# Patient Record
Sex: Female | Born: 1945 | Race: White | Hispanic: No | Marital: Married | Smoking: Current every day smoker
Health system: Southern US, Community
[De-identification: ages and names within clinical notes are randomized; demographics above are authoritative.]

## PROBLEM LIST (undated history)

## (undated) DIAGNOSIS — R011 Cardiac murmur, unspecified: Secondary | ICD-10-CM

## (undated) DIAGNOSIS — M810 Age-related osteoporosis without current pathological fracture: Secondary | ICD-10-CM

## (undated) DIAGNOSIS — R8271 Bacteriuria: Secondary | ICD-10-CM

## (undated) DIAGNOSIS — N3090 Cystitis, unspecified without hematuria: Secondary | ICD-10-CM

## (undated) DIAGNOSIS — R1310 Dysphagia, unspecified: Secondary | ICD-10-CM

## (undated) DIAGNOSIS — K589 Irritable bowel syndrome without diarrhea: Secondary | ICD-10-CM

## (undated) DIAGNOSIS — M1990 Unspecified osteoarthritis, unspecified site: Secondary | ICD-10-CM

## (undated) DIAGNOSIS — J45909 Unspecified asthma, uncomplicated: Secondary | ICD-10-CM

## (undated) DIAGNOSIS — F32A Depression, unspecified: Secondary | ICD-10-CM

## (undated) DIAGNOSIS — F3163 Bipolar disorder, current episode mixed, severe, without psychotic features: Secondary | ICD-10-CM

## (undated) DIAGNOSIS — IMO0002 Reserved for concepts with insufficient information to code with codable children: Secondary | ICD-10-CM

## (undated) DIAGNOSIS — F419 Anxiety disorder, unspecified: Secondary | ICD-10-CM

## (undated) DIAGNOSIS — F319 Bipolar disorder, unspecified: Secondary | ICD-10-CM

## (undated) DIAGNOSIS — F329 Major depressive disorder, single episode, unspecified: Secondary | ICD-10-CM

## (undated) DIAGNOSIS — D696 Thrombocytopenia, unspecified: Secondary | ICD-10-CM

## (undated) DIAGNOSIS — F0390 Unspecified dementia without behavioral disturbance: Secondary | ICD-10-CM

## (undated) HISTORY — DX: Unspecified asthma, uncomplicated: J45.909

## (undated) HISTORY — DX: Bipolar disorder, current episode mixed, severe, without psychotic features: F31.63

## (undated) HISTORY — DX: Cardiac murmur, unspecified: R01.1

## (undated) HISTORY — PX: CHOLECYSTECTOMY: SHX55

## (undated) HISTORY — DX: Major depressive disorder, single episode, unspecified: F32.9

## (undated) HISTORY — DX: Depression, unspecified: F32.A

## (undated) HISTORY — DX: Reserved for concepts with insufficient information to code with codable children: IMO0002

## (undated) HISTORY — DX: Cystitis, unspecified without hematuria: N30.90

## (undated) HISTORY — DX: Thrombocytopenia, unspecified: D69.6

## (undated) HISTORY — PX: ELBOW SURGERY: SHX618

## (undated) HISTORY — DX: Age-related osteoporosis without current pathological fracture: M81.0

## (undated) HISTORY — PX: SPINE SURGERY: SHX786

## (undated) HISTORY — DX: Irritable bowel syndrome, unspecified: K58.9

## (undated) HISTORY — PX: SHOULDER SURGERY: SHX246

## (undated) HISTORY — PX: BREAST BIOPSY: SHX20

## (undated) HISTORY — DX: Dysphagia, unspecified: R13.10

## (undated) HISTORY — DX: Unspecified osteoarthritis, unspecified site: M19.90

## (undated) HISTORY — DX: Bacteriuria: R82.71

---

## 2000-05-17 HISTORY — PX: COLONOSCOPY: SHX174

## 2003-04-19 ENCOUNTER — Other Ambulatory Visit: Payer: Self-pay

## 2003-04-26 ENCOUNTER — Other Ambulatory Visit: Payer: Self-pay

## 2004-02-07 ENCOUNTER — Other Ambulatory Visit: Payer: Self-pay

## 2004-04-29 ENCOUNTER — Ambulatory Visit: Payer: Self-pay | Admitting: Physician Assistant

## 2004-06-01 ENCOUNTER — Emergency Department: Payer: Self-pay | Admitting: Emergency Medicine

## 2004-06-10 ENCOUNTER — Emergency Department: Payer: Self-pay | Admitting: Emergency Medicine

## 2004-06-12 ENCOUNTER — Ambulatory Visit: Payer: Self-pay | Admitting: Unknown Physician Specialty

## 2004-06-16 ENCOUNTER — Ambulatory Visit: Payer: Self-pay | Admitting: Unknown Physician Specialty

## 2004-11-06 ENCOUNTER — Ambulatory Visit: Payer: Self-pay

## 2004-11-11 ENCOUNTER — Ambulatory Visit: Payer: Self-pay | Admitting: Surgery

## 2004-11-22 ENCOUNTER — Emergency Department: Payer: Self-pay | Admitting: Unknown Physician Specialty

## 2004-11-22 ENCOUNTER — Other Ambulatory Visit: Payer: Self-pay

## 2004-11-26 ENCOUNTER — Emergency Department: Payer: Self-pay | Admitting: Emergency Medicine

## 2004-12-28 ENCOUNTER — Ambulatory Visit: Payer: Self-pay | Admitting: Unknown Physician Specialty

## 2005-01-15 ENCOUNTER — Emergency Department: Payer: Self-pay | Admitting: Emergency Medicine

## 2005-02-10 ENCOUNTER — Emergency Department: Payer: Self-pay | Admitting: Emergency Medicine

## 2005-09-01 ENCOUNTER — Ambulatory Visit: Payer: Self-pay | Admitting: Specialist

## 2005-09-21 ENCOUNTER — Other Ambulatory Visit: Payer: Self-pay

## 2005-09-21 ENCOUNTER — Emergency Department: Payer: Self-pay | Admitting: Emergency Medicine

## 2005-12-01 ENCOUNTER — Ambulatory Visit: Payer: Self-pay | Admitting: Surgery

## 2006-03-18 ENCOUNTER — Inpatient Hospital Stay: Payer: Self-pay | Admitting: Specialist

## 2006-03-18 ENCOUNTER — Other Ambulatory Visit: Payer: Self-pay

## 2006-03-22 ENCOUNTER — Ambulatory Visit: Payer: Self-pay | Admitting: Specialist

## 2006-05-15 ENCOUNTER — Emergency Department: Payer: Self-pay | Admitting: Emergency Medicine

## 2006-06-09 ENCOUNTER — Ambulatory Visit: Payer: Self-pay | Admitting: Specialist

## 2006-07-15 ENCOUNTER — Ambulatory Visit: Payer: Self-pay | Admitting: Specialist

## 2006-08-22 ENCOUNTER — Other Ambulatory Visit: Payer: Self-pay

## 2006-08-23 ENCOUNTER — Inpatient Hospital Stay: Payer: Self-pay | Admitting: Unknown Physician Specialty

## 2006-08-23 ENCOUNTER — Observation Stay: Payer: Self-pay | Admitting: Unknown Physician Specialty

## 2006-12-05 ENCOUNTER — Ambulatory Visit: Payer: Self-pay | Admitting: General Surgery

## 2007-01-20 ENCOUNTER — Ambulatory Visit: Payer: Self-pay | Admitting: Internal Medicine

## 2007-08-14 ENCOUNTER — Other Ambulatory Visit: Payer: Self-pay

## 2007-08-14 ENCOUNTER — Inpatient Hospital Stay: Payer: Self-pay | Admitting: Specialist

## 2007-11-28 ENCOUNTER — Ambulatory Visit: Payer: Self-pay | Admitting: Family Medicine

## 2007-12-19 ENCOUNTER — Ambulatory Visit: Payer: Self-pay | Admitting: General Surgery

## 2008-01-23 ENCOUNTER — Ambulatory Visit: Payer: Self-pay | Admitting: Family Medicine

## 2008-04-21 ENCOUNTER — Ambulatory Visit: Payer: Self-pay | Admitting: Family Medicine

## 2008-05-13 ENCOUNTER — Ambulatory Visit: Payer: Self-pay | Admitting: Unknown Physician Specialty

## 2008-05-23 ENCOUNTER — Ambulatory Visit: Payer: Self-pay | Admitting: Internal Medicine

## 2008-10-09 ENCOUNTER — Ambulatory Visit: Payer: Self-pay | Admitting: Unknown Physician Specialty

## 2008-12-26 ENCOUNTER — Ambulatory Visit: Payer: Self-pay | Admitting: Family Medicine

## 2009-01-16 ENCOUNTER — Ambulatory Visit: Payer: Self-pay | Admitting: General Surgery

## 2009-01-27 ENCOUNTER — Ambulatory Visit: Payer: Self-pay | Admitting: Unknown Physician Specialty

## 2009-03-21 ENCOUNTER — Encounter: Admission: RE | Admit: 2009-03-21 | Discharge: 2009-03-21 | Payer: Self-pay | Admitting: Neurology

## 2009-04-12 ENCOUNTER — Emergency Department: Payer: Self-pay | Admitting: Internal Medicine

## 2009-05-17 HISTORY — PX: OTHER SURGICAL HISTORY: SHX169

## 2009-05-17 HISTORY — PX: UPPER GI ENDOSCOPY: SHX6162

## 2009-06-09 ENCOUNTER — Ambulatory Visit: Payer: Self-pay | Admitting: Unknown Physician Specialty

## 2009-07-07 ENCOUNTER — Emergency Department: Payer: Self-pay | Admitting: Emergency Medicine

## 2009-08-18 ENCOUNTER — Ambulatory Visit: Payer: Self-pay | Admitting: Unknown Physician Specialty

## 2009-10-31 ENCOUNTER — Inpatient Hospital Stay: Payer: Self-pay | Admitting: Orthopedic Surgery

## 2009-11-05 ENCOUNTER — Ambulatory Visit: Payer: Self-pay | Admitting: Cardiovascular Disease

## 2010-03-12 ENCOUNTER — Ambulatory Visit: Payer: Self-pay | Admitting: Orthopedic Surgery

## 2010-03-17 ENCOUNTER — Ambulatory Visit: Payer: Self-pay | Admitting: Orthopedic Surgery

## 2010-05-17 HISTORY — PX: OTHER SURGICAL HISTORY: SHX169

## 2010-09-28 ENCOUNTER — Ambulatory Visit: Payer: Self-pay | Admitting: Otolaryngology

## 2010-09-29 ENCOUNTER — Ambulatory Visit: Payer: Self-pay | Admitting: General Surgery

## 2010-09-30 LAB — PATHOLOGY REPORT

## 2010-10-30 ENCOUNTER — Ambulatory Visit: Payer: Self-pay | Admitting: Orthopedic Surgery

## 2010-11-05 ENCOUNTER — Ambulatory Visit: Payer: Self-pay | Admitting: Anesthesiology

## 2010-11-10 ENCOUNTER — Ambulatory Visit: Payer: Self-pay | Admitting: Orthopedic Surgery

## 2010-11-28 ENCOUNTER — Ambulatory Visit: Payer: Self-pay | Admitting: Unknown Physician Specialty

## 2011-01-25 ENCOUNTER — Ambulatory Visit: Payer: Self-pay | Admitting: Unknown Physician Specialty

## 2011-05-28 ENCOUNTER — Ambulatory Visit: Payer: Self-pay

## 2011-06-17 ENCOUNTER — Ambulatory Visit: Payer: Self-pay | Admitting: Rheumatology

## 2011-07-13 ENCOUNTER — Ambulatory Visit: Payer: Self-pay | Admitting: Rheumatology

## 2011-10-13 ENCOUNTER — Ambulatory Visit: Payer: Self-pay | Admitting: General Surgery

## 2011-10-25 ENCOUNTER — Ambulatory Visit: Payer: Self-pay | Admitting: General Surgery

## 2011-11-21 ENCOUNTER — Ambulatory Visit: Payer: Self-pay

## 2011-12-20 ENCOUNTER — Ambulatory Visit: Payer: Self-pay | Admitting: Internal Medicine

## 2012-02-24 ENCOUNTER — Ambulatory Visit: Payer: Self-pay | Admitting: Internal Medicine

## 2012-02-24 LAB — CBC WITH DIFFERENTIAL/PLATELET
Basophil #: 0.1 10*3/uL (ref 0.0–0.1)
Basophil %: 1.2 %
HCT: 38.7 % (ref 35.0–47.0)
HGB: 12.5 g/dL (ref 12.0–16.0)
Lymphocyte #: 2.7 10*3/uL (ref 1.0–3.6)
MCV: 97 fL (ref 80–100)
Monocyte %: 7.8 %
Neutrophil #: 3.9 10*3/uL (ref 1.4–6.5)
RDW: 14.7 % — ABNORMAL HIGH (ref 11.5–14.5)
WBC: 7.6 10*3/uL (ref 3.6–11.0)

## 2012-02-24 LAB — COMPREHENSIVE METABOLIC PANEL
Alkaline Phosphatase: 89 U/L (ref 50–136)
BUN: 17 mg/dL (ref 7–18)
Bilirubin,Total: 0.3 mg/dL (ref 0.2–1.0)
Creatinine: 1.07 mg/dL (ref 0.60–1.30)
EGFR (Non-African Amer.): 54 — ABNORMAL LOW
Glucose: 94 mg/dL (ref 65–99)
SGOT(AST): 14 U/L — ABNORMAL LOW (ref 15–37)
SGPT (ALT): 15 U/L (ref 12–78)
Total Protein: 6.5 g/dL (ref 6.4–8.2)

## 2012-07-11 ENCOUNTER — Ambulatory Visit: Payer: Self-pay | Admitting: Obstetrics and Gynecology

## 2012-11-06 ENCOUNTER — Ambulatory Visit: Payer: Self-pay | Admitting: General Surgery

## 2012-12-13 ENCOUNTER — Ambulatory Visit: Payer: Self-pay | Admitting: General Surgery

## 2012-12-13 ENCOUNTER — Encounter: Payer: Self-pay | Admitting: General Surgery

## 2012-12-21 ENCOUNTER — Ambulatory Visit: Payer: Self-pay | Admitting: General Surgery

## 2013-01-08 ENCOUNTER — Encounter: Payer: Self-pay | Admitting: *Deleted

## 2013-01-16 ENCOUNTER — Ambulatory Visit: Payer: Self-pay | Admitting: General Surgery

## 2013-01-23 ENCOUNTER — Ambulatory Visit (INDEPENDENT_AMBULATORY_CARE_PROVIDER_SITE_OTHER): Payer: Medicare Other | Admitting: General Surgery

## 2013-01-23 ENCOUNTER — Encounter: Payer: Self-pay | Admitting: General Surgery

## 2013-01-23 VITALS — BP 110/70 | HR 68 | Resp 12 | Ht 62.0 in | Wt 119.0 lb

## 2013-01-23 DIAGNOSIS — Z1239 Encounter for other screening for malignant neoplasm of breast: Secondary | ICD-10-CM

## 2013-01-23 DIAGNOSIS — D0502 Lobular carcinoma in situ of left breast: Secondary | ICD-10-CM

## 2013-01-23 DIAGNOSIS — D0500 Lobular carcinoma in situ of unspecified breast: Secondary | ICD-10-CM | POA: Insufficient documentation

## 2013-01-23 DIAGNOSIS — Z853 Personal history of malignant neoplasm of breast: Secondary | ICD-10-CM

## 2013-01-23 NOTE — Progress Notes (Signed)
Patient ID: Deborah [name], female   DOB: Nov 05, 1945, 67 y.o.   MRN: [id number]  Chief Complaint  Patient presents with  . Follow-up    MAMMOGRAM    HPI Deborah [name] is a 67 y.o. female who presents for a breast evaluation. The most recent mammogram was done on 12/13/12 with a birad category 2. Patient does perform regular self breast checks and gets regular mammograms done.  No new breast complaints at this time.    HPI  Past Medical History  Diagnosis Date  . Asthma   . Ulcer   . Heart murmur   . Cystitis   . Arthritis   . Depression   . Spastic colon   . IBS (irritable bowel syndrome)   . Osteoporosis     Past Surgical History  Procedure Laterality Date  . Cholecystectomy    . Spine surgery    . Colonoscopy  2002  . Upper gi endoscopy  2011  . Arm surgery Left 2011    fracture repair  . Throat biopsy   2012  . Shoulder surgery    . Elbow surgery    . Breast biopsy      LCIS    Family History  Problem Relation Age of Onset  . Cancer Sister     ovarian  . Cancer Maternal Aunt     breast  . Cancer Maternal Aunt     breast    Social History History  Substance Use Topics  . Smoking status: Current Every Day Smoker -- 1.00 packs/day for 20 years    Types: Cigarettes  . Smokeless tobacco: Never Used  . Alcohol Use: No    Allergies  Allergen Reactions  . Prednisone Nausea And Vomiting    Current Outpatient Prescriptions  Medication Sig Dispense Refill  . alendronate (FOSAMAX) 70 MG tablet Take 70 mg by mouth every 7 (seven) days. Take with a full glass of water on an empty stomach.      . diazepam (VALIUM) 5 MG tablet Take 1 tablet by mouth daily.      Marland Kitchen escitalopram (LEXAPRO) 10 MG tablet Take 10 mg by mouth daily.      . Naproxen Sodium (ALEVE) 220 MG CAPS Take 1 capsule by mouth as needed.      . topiramate (TOPAMAX) 100 MG tablet Take 1 tablet by mouth daily.      . traZODone (DESYREL) 100 MG tablet Take 1 tablet by mouth 2 (two) times daily.        No current facility-administered medications for this visit.    Review of Systems Review of Systems  Constitutional: Negative.   Respiratory: Negative.   Cardiovascular: Negative.     Blood pressure 110/70, pulse 68, resp. rate 12, height 5\' 2"  (1.575 m), weight 119 lb (53.978 kg).  Physical Exam Physical Exam  Constitutional: She appears well-developed and well-nourished.  Eyes: Conjunctivae are normal. No scleral icterus.  Neck: No thyromegaly present.  Cardiovascular: Normal rate, regular rhythm and normal heart sounds.   No murmur heard. Pulmonary/Chest: Effort normal and breath sounds normal. Right breast exhibits no inverted nipple, no mass, no nipple discharge, no skin change and no tenderness. Left breast exhibits no inverted nipple, no mass, no nipple discharge, no skin change and no tenderness.  Abdominal: Soft. Normal appearance and bowel sounds are normal. There is no hepatosplenomegaly. There is no tenderness. No hernia.  Lymphadenopathy:    She has no cervical adenopathy.    She has no axillary adenopathy.  Neurological: She is alert.  Skin: Skin is warm and dry.    Data Reviewed  Mammogram reviewed.   Assessment    Stable exam. Patient with a history of LCIS.     Plan    Patient to return 1 year with bilateral screening mammogram at [facility].        SANKAR,SEEPLAPUTHUR G 01/23/2013, 7:20 PM

## 2013-01-23 NOTE — Patient Instructions (Signed)
Patient to return 1 year with bilateral screening mammogram at [facility].

## 2013-08-28 ENCOUNTER — Ambulatory Visit: Payer: Self-pay | Admitting: Emergency Medicine

## 2013-08-28 LAB — COMPREHENSIVE METABOLIC PANEL
Albumin: 4.2 g/dL (ref 3.4–5.0)
Alkaline Phosphatase: 78 U/L
Anion Gap: 9 (ref 7–16)
BUN: 13 mg/dL (ref 7–18)
Bilirubin,Total: 0.5 mg/dL (ref 0.2–1.0)
Calcium, Total: 9.4 mg/dL (ref 8.5–10.1)
Chloride: 102 mmol/L (ref 98–107)
Co2: 27 mmol/L (ref 21–32)
Creatinine: 1.12 mg/dL (ref 0.60–1.30)
EGFR (African American): 59 — ABNORMAL LOW
EGFR (Non-African Amer.): 51 — ABNORMAL LOW
Glucose: 100 mg/dL — ABNORMAL HIGH (ref 65–99)
Osmolality: 276 (ref 275–301)
Potassium: 4.3 mmol/L (ref 3.5–5.1)
SGOT(AST): 11 U/L — ABNORMAL LOW (ref 15–37)
SGPT (ALT): 14 U/L (ref 12–78)
Sodium: 138 mmol/L (ref 136–145)
Total Protein: 7.3 g/dL (ref 6.4–8.2)

## 2013-08-28 LAB — CBC WITH DIFFERENTIAL/PLATELET
Basophil #: 0.1 10*3/uL (ref 0.0–0.1)
Basophil %: 0.8 %
Eosinophil #: 0.1 10*3/uL (ref 0.0–0.7)
Eosinophil %: 1.1 %
HCT: 40.5 % (ref 35.0–47.0)
HGB: 13.3 g/dL (ref 12.0–16.0)
Lymphocyte #: 2.5 10*3/uL (ref 1.0–3.6)
Lymphocyte %: 33.6 %
MCH: 32.1 pg (ref 26.0–34.0)
MCHC: 32.9 g/dL (ref 32.0–36.0)
MCV: 98 fL (ref 80–100)
Monocyte #: 0.4 x10 3/mm (ref 0.2–0.9)
Monocyte %: 5.5 %
Neutrophil #: 4.4 10*3/uL (ref 1.4–6.5)
Neutrophil %: 59 %
Platelet: 157 10*3/uL (ref 150–440)
RBC: 4.16 10*6/uL (ref 3.80–5.20)
RDW: 14.9 % — ABNORMAL HIGH (ref 11.5–14.5)
WBC: 7.5 10*3/uL (ref 3.6–11.0)

## 2013-08-30 ENCOUNTER — Emergency Department: Payer: Self-pay | Admitting: Emergency Medicine

## 2013-08-30 LAB — CBC
HCT: 40 % (ref 35.0–47.0)
HGB: 13.1 g/dL (ref 12.0–16.0)
MCH: 32.3 pg (ref 26.0–34.0)
MCHC: 32.7 g/dL (ref 32.0–36.0)
MCV: 99 fL (ref 80–100)
PLATELETS: 141 10*3/uL — AB (ref 150–440)
RBC: 4.04 10*6/uL (ref 3.80–5.20)
RDW: 15.2 % — ABNORMAL HIGH (ref 11.5–14.5)
WBC: 7.3 10*3/uL (ref 3.6–11.0)

## 2013-08-30 LAB — SALICYLATE LEVEL: SALICYLATES, SERUM: 4.8 mg/dL — AB

## 2013-08-30 LAB — COMPREHENSIVE METABOLIC PANEL
ALBUMIN: 3.7 g/dL (ref 3.4–5.0)
ALT: 13 U/L (ref 12–78)
ANION GAP: 4 — AB (ref 7–16)
Alkaline Phosphatase: 71 U/L
BUN: 15 mg/dL (ref 7–18)
Bilirubin,Total: 0.4 mg/dL (ref 0.2–1.0)
CALCIUM: 8.9 mg/dL (ref 8.5–10.1)
CHLORIDE: 109 mmol/L — AB (ref 98–107)
CO2: 23 mmol/L (ref 21–32)
Creatinine: 0.88 mg/dL (ref 0.60–1.30)
EGFR (African American): >60
EGFR (Non-African Amer.): >60
GLUCOSE: 91 mg/dL (ref 65–99)
OSMOLALITY: 272 (ref 275–301)
POTASSIUM: 4.9 mmol/L (ref 3.5–5.1)
SGOT(AST): 21 U/L (ref 15–37)
SODIUM: 136 mmol/L (ref 136–145)
TOTAL PROTEIN: 7.1 g/dL (ref 6.4–8.2)

## 2013-08-30 LAB — ACETAMINOPHEN LEVEL

## 2013-08-30 LAB — TSH: Thyroid Stimulating Horm: 2.35 u[IU]/mL

## 2013-08-30 LAB — ETHANOL: Ethanol %: <0.003 % (ref 0.000–0.080)

## 2014-01-28 ENCOUNTER — Ambulatory Visit: Payer: Medicare Other | Admitting: General Surgery

## 2014-02-07 ENCOUNTER — Ambulatory Visit: Payer: Self-pay | Admitting: General Surgery

## 2014-02-11 ENCOUNTER — Encounter: Payer: Self-pay | Admitting: General Surgery

## 2014-02-14 ENCOUNTER — Encounter: Payer: Self-pay | Admitting: General Surgery

## 2014-02-14 ENCOUNTER — Ambulatory Visit (INDEPENDENT_AMBULATORY_CARE_PROVIDER_SITE_OTHER): Payer: Commercial Managed Care - HMO | Admitting: General Surgery

## 2014-02-14 VITALS — BP 114/68 | HR 74 | Resp 14 | Ht 62.0 in | Wt 125.0 lb

## 2014-02-14 DIAGNOSIS — D0502 Lobular carcinoma in situ of left breast: Secondary | ICD-10-CM

## 2014-02-14 NOTE — Patient Instructions (Signed)
Patient to return in 1 year for follow up after mammogram. Continue self breast exams. Call office for any new breast issues or concerns.  

## 2014-02-14 NOTE — Progress Notes (Signed)
Patient ID: Deborah [name], female   DOB: 11/22/1945, 68 y.o.   MRN: [id number]  Chief Complaint  Patient presents with  . Follow-up    1 year follow up mammogram     HPI Deborah NapBetty Sue [name] is a 68 y.o. female.  who presents for her follow up mammogram and breast evaluation. The most recent mammogram was done on 02-07-14.  Patient does perform regular self breast checks and gets regular mammograms done.  No new breast issues. She states she still has epigastric pain occasionally that has not changed. She states she has had pain for over 1 year.   HPI  Past Medical History  Diagnosis Date  . Asthma   . Ulcer   . Heart murmur   . Cystitis   . Arthritis   . Depression   . Spastic colon   . IBS (irritable bowel syndrome)   . Osteoporosis     Past Surgical History  Procedure Laterality Date  . Cholecystectomy    . Spine surgery    . Colonoscopy  2002  . Upper gi endoscopy  2011  . Arm surgery Left 2011    fracture repair  . Throat biopsy   2012  . Shoulder surgery    . Elbow surgery    . Breast biopsy      LCIS    Family History  Problem Relation Age of Onset  . Cancer Sister     ovarian  . Cancer Maternal Aunt     breast  . Cancer Maternal Aunt     breast    Social History History  Substance Use Topics  . Smoking status: Current Every Day Smoker -- 1.00 packs/day for 20 years    Types: Cigarettes  . Smokeless tobacco: Never Used  . Alcohol Use: No    Allergies  Allergen Reactions  . Prednisone Nausea And Vomiting    Current Outpatient Prescriptions  Medication Sig Dispense Refill  . alendronate (FOSAMAX) 70 MG tablet Take 70 mg by mouth every 7 (seven) days. Take with a full glass of water on an empty stomach.      . diazepam (VALIUM) 5 MG tablet Take 2 mg by mouth daily.       Marland Kitchen. escitalopram (LEXAPRO) 10 MG tablet Take 10 mg by mouth daily.      . Naproxen Sodium (ALEVE) 220 MG CAPS Take 1 capsule by mouth as needed.      . topiramate (TOPAMAX) 100  MG tablet Take 1 tablet by mouth daily.      . traZODone (DESYREL) 100 MG tablet Take 1 tablet by mouth 2 (two) times daily.       No current facility-administered medications for this visit.    Review of Systems Review of Systems  Constitutional: Negative.   Respiratory: Negative.   Cardiovascular: Negative.     Blood pressure 114/68, pulse 74, resp. rate 14, height 5\' 2"  (1.575 m), weight 125 lb (56.7 kg).  Physical Exam Physical Exam  Constitutional: She is oriented to person, place, and time. She appears well-developed and well-nourished.  Eyes: Conjunctivae are normal. No scleral icterus.  Neck: Neck supple. No thyromegaly present.  Cardiovascular: Normal rate, regular rhythm and normal heart sounds.   No murmur heard. Pulmonary/Chest: Effort normal and breath sounds normal. Right breast exhibits no inverted nipple, no mass, no nipple discharge, no skin change and no tenderness. Left breast exhibits no inverted nipple, no mass, no nipple discharge, no skin change and no tenderness.  Abdominal: Soft. Normal appearance and bowel sounds are normal. There is no hepatosplenomegaly. There is no tenderness. No hernia.  Lymphadenopathy:    She has no cervical adenopathy.    She has no axillary adenopathy.  Neurological: She is alert and oriented to person, place, and time.  Skin: Skin is warm and dry.    Data Reviewed Mammogram reviewed and stable.   Assessment    Stable exam. Pt with LCIS. Abd pain- she has had prior cholecystectomy and has been followed by GI in past.     Plan    40yr f/u with bilateral mammogram. Recommend she see her gastroenterologist for her abd pain.       Aiva Miskell G 02/15/2014, 10:16 AM

## 2014-02-15 ENCOUNTER — Encounter: Payer: Self-pay | Admitting: General Surgery

## 2014-03-18 ENCOUNTER — Encounter: Payer: Self-pay | Admitting: General Surgery

## 2014-07-04 DIAGNOSIS — F1320 Sedative, hypnotic or anxiolytic dependence, uncomplicated: Secondary | ICD-10-CM | POA: Diagnosis not present

## 2014-07-04 DIAGNOSIS — F331 Major depressive disorder, recurrent, moderate: Secondary | ICD-10-CM | POA: Diagnosis not present

## 2014-08-29 DIAGNOSIS — F331 Major depressive disorder, recurrent, moderate: Secondary | ICD-10-CM | POA: Diagnosis not present

## 2014-08-29 DIAGNOSIS — F1320 Sedative, hypnotic or anxiolytic dependence, uncomplicated: Secondary | ICD-10-CM | POA: Diagnosis not present

## 2014-09-07 NOTE — Consult Note (Signed)
PATIENT NAME:  Deborah [name], Deborah [name] MR#:  [id number] DATE OF BIRTH:  [birth year]  DATE OF CONSULTATION:  08/30/2013  REFERRING PHYSICIAN:  Doug Sou, MD CONSULTING PHYSICIAN:  Ardeen Fillers. Garnetta Buddy, MD  REASON FOR CONSULTATION: Withdrawal from the medications.   HISTORY OF PRESENT ILLNESS: The patient is a 69 year old married female who presented to the ER as she reported that she is currently experiencing withdrawal from her psychotropic medication. She reported that she was following with Dr. Mervyn Skeeters at [facility] clinic for the past couple of years. However, they decided that they are not going to continue with her [facility] insurance. They gave her 3 or 4 months of prescriptions. She reported that she was also given a list of her new insurances. She reported that she was trying to find another doctor in the area. However, she was seen at the urgent care 3 days ago and was given 6 pills of 50 mg of trazodone. The patient reported that she was actually taking 300 mg of trazodone. She stated that she started having withdrawals from her medications. However, it was noted that the patient has taken her medications last time in March. She appeared very apprehensive during the interview. She reported that she is having severe anxiety and having knots in her stomach. Initially when she was triaged in the ER she decided to leave AMA and went out as she was feeling anxious without her husband. Her husband  asked her to come back again in the ER as he does not want to take her back home.   During my interview, the patient continues to complain of anxiety in her stomach and knots. She reported that she is having withdrawals from her psychotropic medications. She does not remember the names of the medication, besides Valium, trazodone, and Lexapro. She stated that she feels that her memory is going bad at this time because of the withdrawal symptoms. However, she was not given any prescriptions since March. She stated that she  cannot sleep well at night as she was taking higher doses of Valium which is 5 mg as well as trazodone 200 mg. She reported that she also went to her family doctor who gave her a shot of antibiotic. She currently denied having any perceptual disturbances. She currently denies having any suicidal or homicidal ideations or plans.   PAST PSYCHIATRIC HISTORY: The patient reported that she was admitted to this hospital in the past many years ago. She does not remember her diagnosis. She reported that she was following at [facility] for many years and was diagnosed with anxiety. She does not have any history of paranoia or suicide attempts.   PAST MEDICAL HISTORY: The patient has history of right ORIF. She also has long history of confusion and benzodiazepine dependence. History of chest pain, migraine headaches, dizziness, Bell palsy, hypertension, peptic ulcer disease, COPD, asthma, status post colonoscopy, right humerus surgery, cataract extraction, diskectomy, left shoulder surgery, history of epicondylitis, status post breast surgery, status post cholecystectomy.   ALLERGIES: PREDNISONE.   FAMILY HISTORY: The patient is currently married for the past 26 years and lives with her husband. She reported that she has 2 children and her husband has 2 children as well. She does not have any pending legal charges.   ANCILLARY DATA: Temperature 98.2, pulse 70, respirations 18, blood pressure 100/65.  LABORATORY DATA: Glucose 91, BUN 15, creatinine 0.88, sodium 136, potassium 4.9, chloride 109, bicarbonate 23, anion gap 4, osmolality 272, calcium 8.9. Blood alcohol less than 3.  Protein 7.1, albumin 3.7, bilirubin 0.4, alkaline phosphatase 71, AST 21, ALT 13. TSH 2.35. WBC 7.3, RBC 4.04, hemoglobin 13.1, hematocrit 40, platelet count 141,000, MCV 99, RDW 15.2.   MENTAL STATUS EXAMINATION: The patient is a thinly built female who appeared her stated age. She was somewhat anxious and in reported that she is going  through withdrawals. She maintained fair eye contact. Speech was low in tone and volume. Mood was depressed and anxious. Affect was congruent. Thought process was tangential. Thought content was nondelusional. She currently denied having any suicidal or homicidal ideation or plans. Speech is was normal. Language was appropriate. Fund of knowledge seems regular. Denied having any perceptual disturbances. Denied having any thoughts to hurt herself.   DIAGNOSTIC IMPRESSION: AXIS I: 1.  Major depressive disorder, recurrent, moderate.  2.  Benzodiazepine dependence.  AXIS II: None.  AXIS III: Please review the medical history.   TREATMENT PLAN:  I discussed with the patient about the medications and also reviewed her medication bottles. I will restart her on the following medication as follows:  1.  Lexapro 10 mg p.o. q. a.m.  2.  Trazodone 200 mg p.o. at bedtime.  3.  Topamax 50 mg p.o. at bedtime.  4.  Valium 2 mg p.o. at bedtime.   The patient will be provided a prescription for 1 month. She will make a follow-up appointment at [facility]. The patient's history will be discussed with her husband before she will be discharged from the Emergency Room at this time.   Thank you for allowing me to participate in the care of this patient.   ____________________________ Ardeen FillersUzma S. Garnetta BuddyFaheem, MD usf:sb D: 08/30/2013 11:56:45 ET T: 08/30/2013 12:30:44 ET JOB#: [id number]  cc: Ardeen FillersUzma S. Garnetta BuddyFaheem, MD, <Dictator> Rhunette CroftUZMA S Rehmat Murtagh MD ELECTRONICALLY SIGNED 08/30/2013 16:11

## 2014-09-08 DIAGNOSIS — Z8669 Personal history of other diseases of the nervous system and sense organs: Secondary | ICD-10-CM | POA: Insufficient documentation

## 2014-09-08 DIAGNOSIS — K219 Gastro-esophageal reflux disease without esophagitis: Secondary | ICD-10-CM | POA: Insufficient documentation

## 2014-09-08 DIAGNOSIS — Z87898 Personal history of other specified conditions: Secondary | ICD-10-CM | POA: Insufficient documentation

## 2014-09-08 DIAGNOSIS — Z8679 Personal history of other diseases of the circulatory system: Secondary | ICD-10-CM | POA: Insufficient documentation

## 2014-09-08 DIAGNOSIS — Z8709 Personal history of other diseases of the respiratory system: Secondary | ICD-10-CM | POA: Insufficient documentation

## 2014-09-08 DIAGNOSIS — J449 Chronic obstructive pulmonary disease, unspecified: Secondary | ICD-10-CM | POA: Insufficient documentation

## 2014-10-29 ENCOUNTER — Ambulatory Visit: Payer: Commercial Managed Care - HMO | Admitting: Psychiatry

## 2014-10-31 ENCOUNTER — Ambulatory Visit: Payer: Commercial Managed Care - HMO | Admitting: Psychiatry

## 2014-11-11 ENCOUNTER — Ambulatory Visit: Payer: Commercial Managed Care - HMO | Admitting: Psychiatry

## 2014-11-14 ENCOUNTER — Other Ambulatory Visit: Payer: Self-pay | Admitting: Psychiatry

## 2014-11-14 ENCOUNTER — Ambulatory Visit: Payer: Commercial Managed Care - HMO | Admitting: Psychiatry

## 2014-11-14 MED ORDER — TOPIRAMATE 100 MG PO TABS: 100.0000 mg | ORAL_TABLET | Freq: Every day | ORAL | Status: DC

## 2014-11-14 MED ORDER — TRAZODONE HCL 150 MG PO TABS: 150.0000 mg | ORAL_TABLET | Freq: Every day | ORAL | Status: DC

## 2014-11-14 MED ORDER — DIAZEPAM 2 MG PO TABS: 2.0000 mg | ORAL_TABLET | Freq: Every day | ORAL | Status: DC

## 2014-11-14 MED ORDER — QUETIAPINE FUMARATE 100 MG PO TABS: 100.0000 mg | ORAL_TABLET | Freq: Every day | ORAL | Status: DC

## 2014-11-14 MED ORDER — ESCITALOPRAM OXALATE 20 MG PO TABS: 20.0000 mg | ORAL_TABLET | Freq: Every day | ORAL | Status: DC

## 2014-11-22 ENCOUNTER — Telehealth: Payer: Self-pay | Admitting: Psychiatry

## 2014-11-25 NOTE — Telephone Encounter (Signed)
pt would like for you to call her she states it is urgent will not give details

## 2014-11-28 NOTE — Telephone Encounter (Signed)
Late Entry :  Spoke with pt about her termination letter and she stated that really wants to see me and she never received any reminder calls and missed her appointments. She was called several times and she was calling herself on the day of appointments but did not show up. She was sent the letter due to non compliance. Advised her to call office to discuss further care.

## 2014-12-10 ENCOUNTER — Other Ambulatory Visit: Payer: Self-pay | Admitting: Psychiatry

## 2014-12-12 ENCOUNTER — Other Ambulatory Visit: Payer: Self-pay

## 2014-12-12 DIAGNOSIS — Z1231 Encounter for screening mammogram for malignant neoplasm of breast: Secondary | ICD-10-CM

## 2014-12-13 ENCOUNTER — Other Ambulatory Visit: Payer: Self-pay | Admitting: Psychiatry

## 2014-12-18 ENCOUNTER — Ambulatory Visit
Admission: EM | Admit: 2014-12-18 | Discharge: 2014-12-18 | Disposition: A | Discharge status: Home / Self Care | Payer: Commercial Managed Care - HMO | Attending: Internal Medicine | Admitting: Internal Medicine

## 2014-12-18 DIAGNOSIS — R451 Restlessness and agitation: Secondary | ICD-10-CM | POA: Diagnosis not present

## 2014-12-18 MED ORDER — TRAZODONE HCL 50 MG PO TABS: 50.0000 mg | ORAL_TABLET | Freq: Every day | ORAL | Status: DC

## 2014-12-18 NOTE — ED Notes (Signed)
Fired by Therapist, sports and PMD. States ran out of medications 3 weeks ago.

## 2014-12-18 NOTE — ED Provider Notes (Signed)
CSN: [id number]     Arrival date & time 12/18/14  1853 History   First MD Initiated Contact with Patient 12/18/14 1930     Chief Complaint  Patient presents with  . Anxiety   HPI   Patient is a 69 year old lady with history of depression, recently discharged from care by her mental health provider for failing to keep appointments.  Ran out of meds 3 days ago. She states that she needs something for her nerves, her nerves are "shot to hell and back," and she needs a shot to knock her out for tonight. She and her husband argue a lot, about money and her daughters.  This has escalated in the 24 hours, since one of the grown daughters moved out of the home yesterday. During my evaluation, the patient denied feeling suicidal/homicidal, and says she just needs a break from her nerves.   Past Medical History  Diagnosis Date  . Asthma   . Ulcer   . Heart murmur   . Cystitis   . Arthritis   . Depression   . Spastic colon   . IBS (irritable bowel syndrome)   . Osteoporosis    Past Surgical History  Procedure Laterality Date  . Cholecystectomy    . Spine surgery    . Colonoscopy  2002  . Upper gi endoscopy  2011  . Arm surgery Left 2011    fracture repair  . Throat biopsy   2012  . Shoulder surgery    . Elbow surgery    . Breast biopsy      LCIS   Family History  Problem Relation Age of Onset  . Cancer Sister     ovarian  . Cancer Maternal Aunt     breast  . Cancer Maternal Aunt     breast  . Heart attack Mother    History  Substance Use Topics  . Smoking status: Current Every Day Smoker -- 1.00 packs/day for 20 years    Types: Cigarettes  . Smokeless tobacco: Never Used  . Alcohol Use: Yes     Comment: Ocassional   OB History    Gravida Para Term Preterm AB TAB SAB Ectopic Multiple Living   1         2      Obstetric Comments   Menstrual age: 19  Age 1st Pregnancy: 73     Review of Systems  All other systems reviewed and are negative.   Allergies   Prednisone  Home Medications   Prior to Admission medications   Medication Sig Start Date End Date Taking? Authorizing Provider  traZODone (DESYREL) 150 MG tablet Take 1 tablet (150 mg total) by mouth at bedtime. 11/14/14  Yes Brandy Hale, MD  alendronate (FOSAMAX) 70 MG tablet Take 70 mg by mouth every 7 (seven) days. Take with a full glass of water on an empty stomach.    Historical Provider, MD  diazepam (VALIUM) 2 MG tablet Take 1 tablet (2 mg total) by mouth daily with supper. 11/14/14   Brandy Hale, MD  escitalopram (LEXAPRO) 20 MG tablet Take 1 tablet (20 mg total) by mouth daily. 11/14/14   Brandy Hale, MD  Naproxen Sodium (ALEVE) 220 MG CAPS Take 1 capsule by mouth as needed.    Historical Provider, MD  QUEtiapine (SEROQUEL) 100 MG tablet Take 1 tablet (100 mg total) by mouth at bedtime. 11/14/14   Brandy Hale, MD  topiramate (TOPAMAX) 100 MG tablet Take 1 tablet (100 mg total) by  mouth daily. 11/14/14   Brandy Hale, MD    BP 118/66 mmHg  Pulse 74  Temp(Src) 97.8 F (36.6 C) (Tympanic)  Resp 16  Ht 5\' 3"  (1.6 m)  Wt 110 lb (49.896 kg)  BMI 19.49 kg/m2  SpO2 100% Physical Exam  Constitutional: She is oriented to person, place, and time. No distress.  Alert, wearing pajamas/robe, slightly disheveled.  Sitting up on end of exam table.  HENT:  Head: Atraumatic.  Eyes:  Conjugate gaze, no eye redness/drainage  Neck: Neck supple.  Cardiovascular: Normal rate.   Pulmonary/Chest: No respiratory distress.  Abdominal: She exhibits no distension.  Musculoskeletal: Normal range of motion.  No leg swelling  Neurological: She is alert and oriented to person, place, and time.  Skin: Skin is warm and dry.  No cyanosis  Nursing note and vitals reviewed.   ED Course  Procedures  MDM   1. Agitation   Discussed with patient and husband that I was not sure that a knock-out injection was tonight's best plan; an injection would temporarily remove her from today's troubles but she  would wake up tomorrow still in the middle of her troubles.   I told the patient and her husband that I could provide a prescription for a small number of doses of trazodone, that she could start pending followup with her pcp/Dr Feldpausch.to discuss possible treatment options.  Prescription for 50mg  trazodone tablets, #6, sent to the pharmacy.  Pt will try to followup with Dr Maryjane Hurter in the next couple days. To ER for severe symptoms.    Eustace Moore, MD 12/19/14 [phone]

## 2014-12-18 NOTE — Discharge Instructions (Signed)
Prescription for trazodone sent to the [facility] in [place]. Please followup with Dr Maryjane Hurter as soon as possible to discuss further treatment options.  Dysphoria Dysphoria is a condition in which a person feels unpleasant or uncomfortable.  CAUSES  Dysphoria has many possible causes, including:  Anxiety disorders.  Psychiatric disorders.  Withdrawal from drugs or alcohol.  Mood disorders.  Manic disorders.  Transgender disorders.  Reactions to medications.  Personality disorders.  Body dysmorphic disorders. SYMPTOMS  It may include mood changes such as:  Sadness.  Anxiety.  Irritability.  Restlessness. It may just be a feeling of wanting to crawl out of your skin. TREATMENT  You can discuss the treatment of how you feel with your caregiver. Once they have diagnosed the cause, they can treat it. Document Released: 10/12/2005 Document Revised: 07/26/2011 Document Reviewed: 02/17/2006 [facility] Patient Information 2015 [place], [place]. This information is not intended to replace advice given to you by your health care provider. Make sure you discuss any questions you have with your health care provider.

## 2014-12-18 NOTE — ED Notes (Signed)
States my "nerves are shot to hell and back". States daughter moved out today and is extrememly worried. Per husband, the daughter has a "drug problem" and has taken a lot of money.

## 2014-12-19 ENCOUNTER — Other Ambulatory Visit: Payer: Self-pay | Admitting: Psychiatry

## 2014-12-24 DIAGNOSIS — F331 Major depressive disorder, recurrent, moderate: Secondary | ICD-10-CM | POA: Diagnosis not present

## 2014-12-24 DIAGNOSIS — F419 Anxiety disorder, unspecified: Secondary | ICD-10-CM | POA: Diagnosis not present

## 2015-01-07 NOTE — Telephone Encounter (Signed)
She is no longer pt in this office

## 2015-02-03 ENCOUNTER — Ambulatory Visit
Admission: EM | Admit: 2015-02-03 | Discharge: 2015-02-03 | Disposition: A | Discharge status: Home / Self Care | Payer: Commercial Managed Care - HMO | Attending: Family Medicine | Admitting: Family Medicine

## 2015-02-03 ENCOUNTER — Encounter: Payer: Self-pay | Admitting: Emergency Medicine

## 2015-02-03 DIAGNOSIS — F419 Anxiety disorder, unspecified: Secondary | ICD-10-CM

## 2015-02-03 MED ORDER — TRAZODONE HCL 50 MG PO TABS: 50.0000 mg | ORAL_TABLET | Freq: Every day | ORAL | Status: DC

## 2015-02-03 NOTE — ED Provider Notes (Addendum)
CSN: [id number]     Arrival date & time 02/03/15  1028 History   First MD Initiated Contact with Patient 02/03/15 1111     Chief Complaint  Patient presents with  . Anxiety   (Consider location/radiation/quality/duration/timing/severity/associated sxs/prior Treatment) Patient is a 69 y.o. female presenting with anxiety. The history is provided by the patient and a relative. The history is limited by the condition of the patient. No language interpreter was used.  Anxiety This is a chronic problem. The problem has not changed since onset.Nothing aggravates the symptoms. Nothing relieves the symptoms.   Patient was seen here about a month ago at that time she was given 6 trazodone 50 mg tablets according to Dr. Rennie Plowman note instructed follow-up with PCP. They state they thought her PCP who referred her to a psychiatrist but psychiatrist refused to see her because of the limited on Medicare patients are taking. They're given another office go to that office is also trending down. In the meantime they state this PCP has not given any medication for the anxiety and that she needs something for nerves. Past Medical History  Diagnosis Date  . Asthma   . Ulcer   . Heart murmur   . Cystitis   . Arthritis   . Depression   . Spastic colon   . IBS (irritable bowel syndrome)   . Osteoporosis    Past Surgical History  Procedure Laterality Date  . Cholecystectomy    . Spine surgery    . Colonoscopy  2002  . Upper gi endoscopy  2011  . Arm surgery Left 2011    fracture repair  . Throat biopsy   2012  . Shoulder surgery    . Elbow surgery    . Breast biopsy      LCIS   Family History  Problem Relation Age of Onset  . Cancer Sister     ovarian  . Cancer Maternal Aunt     breast  . Cancer Maternal Aunt     breast  . Heart attack Mother    Social History  Substance Use Topics  . Smoking status: Current Every Day Smoker -- 1.00 packs/day for 20 years    Types: Cigarettes  . Smokeless  tobacco: Never Used  . Alcohol Use: Yes     Comment: Ocassional   OB History    Gravida Para Term Preterm AB TAB SAB Ectopic Multiple Living   1         2      Obstetric Comments   Menstrual age: 4  Age 1st Pregnancy: 67     Review of Systems  Unable to perform ROS   Allergies  Prednisone  Home Medications   Prior to Admission medications   Medication Sig Start Date End Date Taking? Authorizing Provider  alendronate (FOSAMAX) 70 MG tablet Take 70 mg by mouth every 7 (seven) days. Take with a full glass of water on an empty stomach.    Historical Provider, MD  diazepam (VALIUM) 2 MG tablet Take 1 tablet (2 mg total) by mouth daily with supper. 11/14/14   Brandy Hale, MD  escitalopram (LEXAPRO) 20 MG tablet Take 1 tablet (20 mg total) by mouth daily. 11/14/14   Brandy Hale, MD  Naproxen Sodium (ALEVE) 220 MG CAPS Take 1 capsule by mouth as needed.    Historical Provider, MD  QUEtiapine (SEROQUEL) 100 MG tablet Take 1 tablet (100 mg total) by mouth at bedtime. 11/14/14   Brandy Hale, MD  topiramate (TOPAMAX) 100 MG tablet Take 1 tablet (100 mg total) by mouth daily. 11/14/14   Brandy Hale, MD  traZODone (DESYREL) 150 MG tablet Take 1 tablet (150 mg total) by mouth at bedtime. 11/14/14   Brandy Hale, MD  traZODone (DESYREL) 50 MG tablet Take 1 tablet (50 mg total) by mouth at bedtime. 12/18/14   Eustace Moore, MD   Meds Ordered and Administered this Visit  Medications - No data to display  BP 101/54 mmHg  Pulse 87  Temp(Src) 98.8 F (37.1 C) (Oral)  Resp 16  Ht  (1.575 m)  Wt 123 lb (55.792 kg)  BMI 22.49 kg/m2  SpO2 99% No data found.   Physical Exam  Constitutional: No distress.  HENT:  Head: Normocephalic and atraumatic.  Neurological: She is alert. No cranial nerve deficit.  Skin: Skin is warm.  Psychiatric: Her mood appears anxious. Her speech is rapid and/or pressured.  Patient has pressured speech able to maintain eye contact. Denies wanting to hurt herself  or others. This was her nerves.  Vitals reviewed.   ED Course  Procedures (including critical care time)  Labs Review Labs Reviewed - No data to display  Imaging Review No results found.   Visual Acuity Review  Right Eye Distance:   Left Eye Distance:   Bilateral Distance:    Right Eye Near:   Left Eye Near:    Bilateral Near:         MDM  No diagnosis found.   I've explained to the patient and to her daughter that I do not feel comfortable prescribing medication that PCP has not prescribed. Explained to them that the PCP should have given her medication for her nerves. That if they're unable to get into the psychiatrist office and her PCP is aware that I will think that they would see him or her again for reevaluation. They states they called the PCP office and was told him to come here. Explained to them that this is an urgent care and despite the patient several times and notices ER this is an urgent care. Explained to them that we treat her urgent problems but not chronic problems are ongoing services anxiety. If her PCP does not want her on a and present there may be a reason why. If there is a reason why PCP does not want patient on a type of medication for specific medication or not the ones to follow her on that medication.  Patient has come off the room questioning me in the alcove if I will give her medication for her nerves. I will give her four trazodone tablets. Her daughter states that she is thinking about taking the Pill yesterday option.    Hassan Rowan, MD 02/03/15 1141  Hassan Rowan, MD 02/03/15 [phone]

## 2015-02-03 NOTE — ED Notes (Addendum)
Patient c/o increased anxiety over the past few days.  Patient states that she has been out of al her psychiatric medications.  Patient states that she no longer has a Therapist, sports.

## 2015-02-03 NOTE — Discharge Instructions (Signed)
Generalized Anxiety Disorder Generalized anxiety disorder (GAD) is a mental disorder. It interferes with life functions, including relationships, work, and school. GAD is different from normal anxiety, which everyone experiences at some point in their lives in response to specific life events and activities. Normal anxiety actually helps us prepare for and get through these life events and activities. Normal anxiety goes away after the event or activity is over.  GAD causes anxiety that is not necessarily related to specific events or activities. It also causes excess anxiety in proportion to specific events or activities. The anxiety associated with GAD is also difficult to control. GAD can vary from mild to severe. People with severe GAD can have intense waves of anxiety with physical symptoms (panic attacks).  SYMPTOMS The anxiety and worry associated with GAD are difficult to control. This anxiety and worry are related to many life events and activities and also occur more days than not for 6 months or longer. People with GAD also have three or more of the following symptoms (one or more in children):  Restlessness.   Fatigue.  Difficulty concentrating.   Irritability.  Muscle tension.  Difficulty sleeping or unsatisfying sleep. DIAGNOSIS GAD is diagnosed through an assessment by your health care provider. Your health care provider will ask you questions aboutyour mood,physical symptoms, and events in your life. Your health care provider may ask you about your medical history and use of alcohol or drugs, including prescription medicines. Your health care provider may also do a physical exam and blood tests. Certain medical conditions and the use of certain substances can cause symptoms similar to those associated with GAD. Your health care provider may refer you to a mental health specialist for further evaluation. TREATMENT The following therapies are usually used to treat GAD:    Medication. Antidepressant medication usually is prescribed for long-term daily control. Antianxiety medicines may be added in severe cases, especially when panic attacks occur.   Talk therapy (psychotherapy). Certain types of talk therapy can be helpful in treating GAD by providing support, education, and guidance. A form of talk therapy called cognitive behavioral therapy can teach you healthy ways to think about and react to daily life events and activities.  Stress managementtechniques. These include yoga, meditation, and exercise and can be very helpful when they are practiced regularly. A mental health specialist can help determine which treatment is best for you. Some people see improvement with one therapy. However, other people require a combination of therapies. Document Released: 08/28/2012 Document Revised: 09/17/2013 Document Reviewed: 08/28/2012 Exit[employer] Patient Information 2015 [employer]. This information is not intended to replace advice given to you by your health care provider. Make sure you discuss any questions you have with your health care provider.  

## 2015-02-11 ENCOUNTER — Ambulatory Visit: Payer: Commercial Managed Care - HMO | Attending: General Surgery

## 2015-02-16 ENCOUNTER — Other Ambulatory Visit: Payer: Self-pay | Admitting: Psychiatry

## 2015-02-18 ENCOUNTER — Telehealth: Payer: Self-pay | Admitting: *Deleted

## 2015-02-18 NOTE — Telephone Encounter (Signed)
Patient cancelled follow up appointment with Dr. Evette Cristal stated she was not feeling well. Patient is aware to call and reschedule once she is better.

## 2015-02-19 ENCOUNTER — Ambulatory Visit: Payer: Commercial Managed Care - HMO | Admitting: General Surgery

## 2015-02-24 ENCOUNTER — Encounter: Payer: Self-pay | Admitting: Emergency Medicine

## 2015-02-24 ENCOUNTER — Emergency Department
Admission: EM | Admit: 2015-02-24 | Discharge: 2015-02-24 | Disposition: A | Discharge status: Home / Self Care | Payer: Commercial Managed Care - HMO | Attending: Emergency Medicine | Admitting: Emergency Medicine

## 2015-02-24 DIAGNOSIS — F419 Anxiety disorder, unspecified: Secondary | ICD-10-CM | POA: Insufficient documentation

## 2015-02-24 DIAGNOSIS — I10 Essential (primary) hypertension: Secondary | ICD-10-CM | POA: Insufficient documentation

## 2015-02-24 DIAGNOSIS — Z79899 Other long term (current) drug therapy: Secondary | ICD-10-CM | POA: Diagnosis not present

## 2015-02-24 DIAGNOSIS — Z720 Tobacco use: Secondary | ICD-10-CM | POA: Insufficient documentation

## 2015-02-24 DIAGNOSIS — F329 Major depressive disorder, single episode, unspecified: Secondary | ICD-10-CM | POA: Insufficient documentation

## 2015-02-24 MED ORDER — TRAZODONE HCL 50 MG PO TABS: 50.0000 mg | ORAL_TABLET | Freq: Every evening | ORAL | Status: DC | PRN

## 2015-02-24 MED ORDER — DIAZEPAM 2 MG PO TABS
2.0000 mg | ORAL_TABLET | Freq: Once | ORAL | Status: AC
  Administered 2015-02-24: 2 mg via ORAL
  Filled 2015-02-24: qty 1

## 2015-02-24 NOTE — Discharge Instructions (Signed)
Generalized Anxiety Disorder Generalized anxiety disorder (GAD) is a mental disorder. It interferes with life functions, including relationships, work, and school. GAD is different from normal anxiety, which everyone experiences at some point in their lives in response to specific life events and activities. Normal anxiety actually helps us prepare for and get through these life events and activities. Normal anxiety goes away after the event or activity is over.  GAD causes anxiety that is not necessarily related to specific events or activities. It also causes excess anxiety in proportion to specific events or activities. The anxiety associated with GAD is also difficult to control. GAD can vary from mild to severe. People with severe GAD can have intense waves of anxiety with physical symptoms (panic attacks).  SYMPTOMS The anxiety and worry associated with GAD are difficult to control. This anxiety and worry are related to many life events and activities and also occur more days than not for 6 months or longer. People with GAD also have three or more of the following symptoms (one or more in children):  Restlessness.   Fatigue.  Difficulty concentrating.   Irritability.  Muscle tension.  Difficulty sleeping or unsatisfying sleep. DIAGNOSIS GAD is diagnosed through an assessment by your health care provider. Your health care provider will ask you questions aboutyour mood,physical symptoms, and events in your life. Your health care provider may ask you about your medical history and use of alcohol or drugs, including prescription medicines. Your health care provider may also do a physical exam and blood tests. Certain medical conditions and the use of certain substances can cause symptoms similar to those associated with GAD. Your health care provider may refer you to a mental health specialist for further evaluation. TREATMENT The following therapies are usually used to treat GAD:    Medication. Antidepressant medication usually is prescribed for long-term daily control. Antianxiety medicines may be added in severe cases, especially when panic attacks occur.   Talk therapy (psychotherapy). Certain types of talk therapy can be helpful in treating GAD by providing support, education, and guidance. A form of talk therapy called cognitive behavioral therapy can teach you healthy ways to think about and react to daily life events and activities.  Stress managementtechniques. These include yoga, meditation, and exercise and can be very helpful when they are practiced regularly. A mental health specialist can help determine which treatment is best for you. Some people see improvement with one therapy. However, other people require a combination of therapies.   This information is not intended to replace advice given to you by your health care provider. Make sure you discuss any questions you have with your health care provider.   Document Released: 08/28/2012 Document Revised: 05/24/2014 Document Reviewed: 08/28/2012 Elsevier Interactive Patient Education 2016 [employer].  

## 2015-02-24 NOTE — ED Provider Notes (Signed)
[facility] Emergency Department Provider Note  ____________________________________________  Time seen: Approximately 1030 am  I have reviewed the triage vital signs and the nursing notes.   HISTORY  Chief Complaint Anxiety    HPI GENIE WENKE is a 69 y.o. female with a history of anxiety and depression who is presenting for anxiety today. The patient says that she has had increasing anxiety for about 1 week. She denies any thoughts of homicidal or suicidal ideation. Denies anyhallucinations. Says that she has been to multiple psychiatrists in the past and was "fired" from her last psychiatrists for missing appointments. She does not want to be admitted today. She says she has had trouble obtaining follow-up with a psychiatrist. Her daughters also the bedside and confirms the story. Denies any pain or shortness of breath at this time.   Past Medical History  Diagnosis Date  . Asthma   . Ulcer   . Heart murmur   . Cystitis   . Arthritis   . Depression   . Spastic colon   . IBS (irritable bowel syndrome)   . Osteoporosis     Patient Active Problem List   Diagnosis Date Noted  . H/O disease 09/08/2014  . History of migraine headaches 09/08/2014  . H/O Bell's palsy 09/08/2014  . H/O: HTN (hypertension) 09/08/2014  . Gastroesophageal reflux disease without esophagitis 09/08/2014  . CAFL (chronic airflow limitation) (HCC) 09/08/2014  . History of asthma 09/08/2014  . 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 Cholecystectomy    . Spine surgery    . Colonoscopy  2002  . Upper gi endoscopy  2011  . Arm surgery Left 2011    fracture repair  . Throat biopsy   2012  . Shoulder surgery    . Elbow surgery    . Breast biopsy      LCIS    Current Outpatient Rx  Name  Route  Sig  Dispense  Refill  . alendronate (FOSAMAX) 70 MG tablet   Oral   Take 70 mg by mouth every 7 (seven) days. Take  with a full glass of water on an empty stomach.         . diazepam (VALIUM) 2 MG tablet   Oral   Take 1 tablet (2 mg total) by mouth daily with supper.   30 tablet   0   . escitalopram (LEXAPRO) 20 MG tablet   Oral   Take 1 tablet (20 mg total) by mouth daily.   30 tablet   0   . Naproxen Sodium (ALEVE) 220 MG CAPS   Oral   Take 1 capsule by mouth as needed.         Marland Kitchen QUEtiapine (SEROQUEL) 100 MG tablet   Oral   Take 1 tablet (100 mg total) by mouth at bedtime.   30 tablet   0   . topiramate (TOPAMAX) 100 MG tablet   Oral   Take 1 tablet (100 mg total) by mouth daily.   30 tablet   0   . traZODone (DESYREL) 150 MG tablet   Oral   Take 1 tablet (150 mg total) by mouth at bedtime.   30 tablet   0   . traZODone (DESYREL) 50 MG tablet   Oral   Take 1 tablet (50 mg total) by mouth at bedtime.   6 tablet   0   . traZODone (DESYREL) 50 MG tablet  Oral   Take 1 tablet (50 mg total) by mouth at bedtime.   4 tablet   0     Allergies Prednisone  Family History  Problem Relation Age of Onset  . Cancer Sister     ovarian  . Cancer Maternal Aunt     breast  . Cancer Maternal Aunt     breast  . Heart attack Mother     Social History Social History  Substance Use Topics  . Smoking status: Current Every Day Smoker -- 1.00 packs/day for 20 years    Types: Cigarettes  . Smokeless tobacco: Never Used  . Alcohol Use: Yes     Comment: Ocassional    Review of Systems Constitutional: No fever/chills Eyes: No visual changes. ENT: No sore throat. Cardiovascular: Denies chest pain. Respiratory: Denies shortness of breath. Gastrointestinal: No abdominal pain.  No nausea, no vomiting.  No diarrhea.  No constipation. Genitourinary: Negative for dysuria. Musculoskeletal: Negative for back pain. Skin: Negative for rash. Neurological: Negative for headaches, focal weakness or numbness.  10-point ROS otherwise  negative.  ____________________________________________   PHYSICAL EXAM:  VITAL SIGNS: ED Triage Vitals  Enc Vitals Group     BP 02/24/15 0907 123/100 mmHg     Pulse Rate 02/24/15 0907 80     Resp 02/24/15 0907 20     Temp 02/24/15 0907 98.3 F (36.8 C)     Temp Source 02/24/15 0907 Oral     SpO2 02/24/15 0907 100 %     Weight 02/24/15 0907 124 lb (56.246 kg)     Height 02/24/15 0907  (1.549 m)     Head Cir --      Peak Flow --      Pain Score 02/24/15 0909 0     Pain Loc --      Pain Edu? --      Excl. in GC? --     Constitutional: Alert and oriented. Well appearing and in no acute distress. Eyes: Conjunctivae are normal. PERRL. EOMI. Head: Atraumatic. Nose: No congestion/rhinnorhea. Mouth/Throat: Mucous membranes are moist.  Oropharynx non-erythematous. Neck: No stridor.   Cardiovascular: Normal rate, regular rhythm. Grossly normal heart sounds.  Good peripheral circulation. Respiratory: Normal respiratory effort.  No retractions. Lungs CTAB. Gastrointestinal: Soft and nontender. No distention. No abdominal bruits. No CVA tenderness. Musculoskeletal: No lower extremity tenderness nor edema.  No joint effusions. Neurologic:  Normal speech and language. No gross focal neurologic deficits are appreciated. No gait instability. Skin:  Skin is warm, dry and intact. No rash noted. Psychiatric: Anxious mood. Pressured speech.   ____________________________________________   LABS (all labs ordered are listed, but only abnormal results are displayed)  Labs Reviewed - No data to display ____________________________________________  EKG   ____________________________________________  RADIOLOGY   ____________________________________________   PROCEDURES    ____________________________________________   INITIAL IMPRESSION / ASSESSMENT AND PLAN / ED COURSE  Pertinent labs & imaging results that were available during my care of the patient were reviewed by  me and considered in my medical decision making (see chart for details).  ----------------------------------------- 12:25 PM on 02/24/2015 -----------------------------------------  Patient given a dose of 2 mg of Valium in the room at this time. Our behavioral health intake was able to obtain an appointment for the patient 9 AM tomorrow at [facility]. I'll give her several doses of trazodone to go home with. Her daughter as well as the patient are aware of the plan and willing to comply. ____________________________________________   FINAL  CLINICAL IMPRESSION(S) / ED DIAGNOSES  Acute on chronic anxiety.    Myrna Blazer, MD 02/24/15 1226
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
## 2015-02-24 NOTE — ED Notes (Addendum)
Pt to ed with daughter who reports pt has had increasing anxiety.  Pt does not have a psychiatrist and is currently not using any medications for anxiety.  Pt daughter states pt has not been sleeping and has been crying nonstop.  Pt denies SI, denies HI, denies hallucinations.

## 2015-02-24 NOTE — [facility] Counselor (Signed)
Per request of EDP (Chavix) and Psychiatrist (Dr. Garnetta Buddy) patient needs outpatient referrals to a psychiatrist. Writer referred patient to [facility]: [address] [place], [place] [address] [phone]. [facility] verified that they would take patient's insurance as well. Patient has a appointment with this provider at [facility] 02/25/2015. Patient and daughter (@ bedside) agreed to this follow up plan.   Patient's nurse and EDP updated with patient's disposition plan.

## 2015-02-24 NOTE — ED Notes (Signed)
Report given to Nellie, RN.  

## 2015-02-24 NOTE — ED Notes (Signed)
Pt awaiting to edp at this time. Pt given some coffee without caffeine.

## 2015-02-24 NOTE — ED Notes (Signed)
Per request of EDP Myrna Blazer, MD), patient will be discharged home. Patient was in need of a follow up appointment for psychiatry. Writer scheduled a appointment for this patient at [facility]: [address] [place], [place], [place] [address] [phone]) [phone]. Patient is scheduled for an appointment at 0900 02/24/2015. Patient and daughter ( ) were agreeable to discharge follow up plan.

## 2015-02-25 DIAGNOSIS — F331 Major depressive disorder, recurrent, moderate: Secondary | ICD-10-CM | POA: Diagnosis not present

## 2015-03-08 ENCOUNTER — Emergency Department
Admission: EM | Admit: 2015-03-08 | Discharge: 2015-03-08 | Disposition: A | Discharge status: Home / Self Care | Payer: Commercial Managed Care - HMO | Attending: Emergency Medicine | Admitting: Emergency Medicine

## 2015-03-08 ENCOUNTER — Encounter: Payer: Self-pay | Admitting: *Deleted

## 2015-03-08 DIAGNOSIS — F99 Mental disorder, not otherwise specified: Secondary | ICD-10-CM | POA: Diagnosis not present

## 2015-03-08 DIAGNOSIS — F339 Major depressive disorder, recurrent, unspecified: Secondary | ICD-10-CM | POA: Diagnosis not present

## 2015-03-08 DIAGNOSIS — Z720 Tobacco use: Secondary | ICD-10-CM | POA: Diagnosis not present

## 2015-03-08 DIAGNOSIS — I10 Essential (primary) hypertension: Secondary | ICD-10-CM | POA: Insufficient documentation

## 2015-03-08 DIAGNOSIS — F329 Major depressive disorder, single episode, unspecified: Secondary | ICD-10-CM | POA: Diagnosis not present

## 2015-03-08 DIAGNOSIS — Z79899 Other long term (current) drug therapy: Secondary | ICD-10-CM | POA: Insufficient documentation

## 2015-03-08 LAB — COMPREHENSIVE METABOLIC PANEL
ALT: 14 U/L (ref 14–54)
AST: 17 U/L (ref 15–41)
Albumin: 4.1 g/dL (ref 3.5–5.0)
Alkaline Phosphatase: 55 U/L (ref 38–126)
Anion gap: 5 (ref 5–15)
BILIRUBIN TOTAL: 0.6 mg/dL (ref 0.3–1.2)
BUN: 18 mg/dL (ref 6–20)
CALCIUM: 8.9 mg/dL (ref 8.9–10.3)
CHLORIDE: 117 mmol/L — AB (ref 101–111)
CO2: 22 mmol/L (ref 22–32)
CREATININE: 1.23 mg/dL — AB (ref 0.44–1.00)
GFR, EST AFRICAN AMERICAN: 51 mL/min — AB (ref >60)
GFR, EST NON AFRICAN AMERICAN: 44 mL/min — AB (ref >60)
Glucose, Bld: 106 mg/dL — ABNORMAL HIGH (ref 65–99)
Potassium: 3.5 mmol/L (ref 3.5–5.1)
Sodium: 144 mmol/L (ref 135–145)
TOTAL PROTEIN: 7.1 g/dL (ref 6.5–8.1)

## 2015-03-08 LAB — CBC
HCT: 41.6 % (ref 35.0–47.0)
Hemoglobin: 13.6 g/dL (ref 12.0–16.0)
MCH: 31.8 pg (ref 26.0–34.0)
MCHC: 32.8 g/dL (ref 32.0–36.0)
MCV: 97.2 fL (ref 80.0–100.0)
PLATELETS: 177 10*3/uL (ref 150–440)
RBC: 4.28 MIL/uL (ref 3.80–5.20)
RDW: 15 % — AB (ref 11.5–14.5)
WBC: 7.6 10*3/uL (ref 3.6–11.0)

## 2015-03-08 LAB — SALICYLATE LEVEL

## 2015-03-08 LAB — ACETAMINOPHEN LEVEL: Acetaminophen (Tylenol), Serum: <10 ug/mL — ABNORMAL LOW (ref 10–30)

## 2015-03-08 LAB — ETHANOL

## 2015-03-08 MED ORDER — LORAZEPAM 1 MG PO TABS
ORAL_TABLET | ORAL | Status: AC
  Administered 2015-03-08: 1 mg via ORAL
  Filled 2015-03-08: qty 1

## 2015-03-08 MED ORDER — LORAZEPAM 1 MG PO TABS
1.0000 mg | ORAL_TABLET | Freq: Once | ORAL | Status: AC
  Administered 2015-03-08: 1 mg via ORAL

## 2015-03-08 NOTE — ED Provider Notes (Signed)
[facility] Emergency Department Provider Note REMINDER - THIS NOTE IS NOT A FINAL MEDICAL RECORD UNTIL IT IS SIGNED. UNTIL THEN, THE CONTENT BELOW MAY REFLECT INFORMATION FROM A DOCUMENTATION TEMPLATE, NOT THE ACTUAL PATIENT VISIT. ____________________________________________  Time seen: Approximately 1:07 PM  I have reviewed the triage vital signs and the nursing notes.   HISTORY  Chief Complaint Depression    HPI Deborah [name] is a 69 y.o. female history of long-standing depression and anxiety. She absolutely denies any thoughts of harming herself, feeling suicidal, or wanting to harm anyone. She reports to me that she feels very sad, anxious, needs a nerve pill and to talk to a psychiatrist. She also tells me that if she has to wait that she may not want to stay and that she has been "fired" from other psychiatrist before.  She denies being in pain. She reports she feels nervous and always wanting to walk around and anxious. She denies any hallucinations, she reports she's been told to see multiple psychiatrists but they've never really help her. She denies absolutely any suicidal or homicidal thoughts.   Past Medical History  Diagnosis Date  . Asthma   . Ulcer   . Heart murmur   . Cystitis   . Arthritis   . Depression   . Spastic colon   . IBS (irritable bowel syndrome)   . Osteoporosis     Patient Active Problem List   Diagnosis Date Noted  . H/O disease 09/08/2014  . History of migraine headaches 09/08/2014  . H/O Bell's palsy 09/08/2014  . H/O: HTN (hypertension) 09/08/2014  . Gastroesophageal reflux disease without esophagitis 09/08/2014  . CAFL (chronic airflow limitation) (HCC) 09/08/2014  . History of asthma 09/08/2014  . 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 Cholecystectomy    . Spine surgery    . Colonoscopy  2002  . Upper gi endoscopy  2011  . Arm surgery Left 2011   fracture repair  . Throat biopsy   2012  . Shoulder surgery    . Elbow surgery    . Breast biopsy      LCIS    Current Outpatient Rx  Name  Route  Sig  Dispense  Refill  . alendronate (FOSAMAX) 70 MG tablet   Oral   Take 70 mg by mouth every 7 (seven) days. Take with a full glass of water on an empty stomach.         . diazepam (VALIUM) 2 MG tablet   Oral   Take 1 tablet (2 mg total) by mouth daily with supper.   30 tablet   0   . escitalopram (LEXAPRO) 20 MG tablet   Oral   Take 1 tablet (20 mg total) by mouth daily.   30 tablet   0   . Naproxen Sodium (ALEVE) 220 MG CAPS   Oral   Take 1 capsule by mouth as needed.         Marland Kitchen QUEtiapine (SEROQUEL) 100 MG tablet   Oral   Take 1 tablet (100 mg total) by mouth at bedtime.   30 tablet   0   . topiramate (TOPAMAX) 100 MG tablet   Oral   Take 1 tablet (100 mg total) by mouth daily.   30 tablet   0   . traZODone (DESYREL) 50 MG tablet   Oral   Take 1 tablet (50 mg total) by mouth at bedtime  as needed for sleep.   5 tablet   0     Allergies Prednisone  Family History  Problem Relation Age of Onset  . Cancer Sister     ovarian  . Cancer Maternal Aunt     breast  . Cancer Maternal Aunt     breast  . Heart attack Mother     Social History Social History  Substance Use Topics  . Smoking status: Current Every Day Smoker -- 1.00 packs/day for 20 years    Types: Cigarettes  . Smokeless tobacco: Never Used  . Alcohol Use: Yes     Comment: Ocassional    Review of Systems Constitutional: No fever/chills Eyes: No visual changes. ENT: No sore throat. Cardiovascular: Denies chest pain. Respiratory: Denies shortness of breath. Gastrointestinal: No abdominal pain.  No nausea, no vomiting.  No diarrhea.  No constipation. Genitourinary: Negative for dysuria. Musculoskeletal: Negative for back pain. Skin: Negative for rash. Neurological: Negative for headaches, focal weakness or numbness.  10-point ROS  otherwise negative.  ____________________________________________   PHYSICAL EXAM:  VITAL SIGNS: ED Triage Vitals  Enc Vitals Group     BP 03/08/15 1232 106/61 mmHg     Pulse Rate 03/08/15 1232 92     Resp 03/08/15 1232 20     Temp 03/08/15 1232 98.6 F (37 C)     Temp Source 03/08/15 1232 Oral     SpO2 03/08/15 1232 95 %     Weight 03/08/15 1232 122 lb (55.339 kg)     Height 03/08/15 1232  (1.575 m)     Head Cir --      Peak Flow --      Pain Score --      Pain Loc --      Pain Edu? --      Excl. in GC? --    Constitutional: Alert and oriented. Frail but otherwise well appearing and in no acute distress. Eyes: Conjunctivae are normal. PERRL. EOMI. Head: Atraumatic. Nose: No congestion/rhinnorhea. Mouth/Throat: Mucous membranes are moist.  Oropharynx non-erythematous. Neck: No stridor.   Cardiovascular: Normal rate, regular rhythm. Grossly normal heart sounds.  Good peripheral circulation. Respiratory: Normal respiratory effort.  No retractions. Lungs CTAB. Gastrointestinal: Soft and nontender. Musculoskeletal: No lower extremity tenderness nor edema.  No joint effusions. Neurologic:  Normal speech and language. No gross focal neurologic deficits are appreciated. Skin:  Skin is warm, dry and intact. No rash noted. Psychiatric: Mood and affect are depressed, but calm and not obviously anxious. Speech and behavior are normal.  ____________________________________________   LABS (all labs ordered are listed, but only abnormal results are displayed)  Labs Reviewed  COMPREHENSIVE METABOLIC PANEL - Abnormal; Notable for the following:    Chloride 117 (*)    Glucose, Bld 106 (*)    Creatinine, Ser 1.23 (*)    GFR calc non Af Amer 44 (*)    GFR calc Af Amer 51 (*)    All other components within normal limits  ACETAMINOPHEN LEVEL - Abnormal; Notable for the following:    Acetaminophen (Tylenol), Serum <10 (*)    All other components within normal limits  CBC -  Abnormal; Notable for the following:    RDW 15.0 (*)    All other components within normal limits  ETHANOL  SALICYLATE LEVEL   ____________________________________________  EKG   ____________________________________________  RADIOLOGY   ____________________________________________   PROCEDURES  Procedure(s) performed: None  Critical Care performed: No  ____________________________________________   INITIAL IMPRESSION /  ASSESSMENT AND PLAN / ED COURSE  Pertinent labs & imaging results that were available during my care of the patient were reviewed by me and considered in my medical decision making (see chart for details).  presents voluntarily, denies any thoughts to harm herself, others, or any hallucinations. She is a long-standing history of difficult to treat depression. She does report she is slightly anxious, but also severely depressed. After some discussion, the patient is willing to stay and be seen by our psychiatrist. I believe she is appropriate to be on a voluntary basis, and will comply with request to treat her anxiety with 1 mg of Ativan while she is awaiting here in the ER. Consult placed to psychiatry.  Patient has no acute medical complaint.  ----------------------------------------- 1:16 PM on 03/08/2015 -----------------------------------------  Patient reports that she needs to go home and spend time with her daughter. She reports that she cannot stay in the emergency room, but she promises me that she will follow-up as an outpatient with [facility]. Unfortunately, patient has made these promises before I think it unlikely she will come to that appointment but, I do not find any reason that she requires inpatient commitment or involuntary commitment at this time. She is awake and alert, she is not homicidal or suicidal on repeated questioning. She is in no distress, has no current medical complaint, and she does appear to be appropriate for outpatient follow-up  should she choose. Highly encouraged her to follow-up, we will discharge her once family is here to pick her up.  Careful return precautions advised. ____________________________________________   FINAL CLINICAL IMPRESSION(S) / ED DIAGNOSES  Final diagnoses:  Episode of recurrent major depressive disorder, unspecified depression episode severity (HCC)      Sharyn CreamerMark Gillie Crisci, MD 03/08/15 1317

## 2015-03-08 NOTE — ED Notes (Signed)
Pt brought in by EMS, pt called EMS for depression,, pt reports being very down and depressed, pt here voluntary, pt denies SI/HI

## 2015-03-08 NOTE — Discharge Instructions (Signed)
You have been seen in the Emergency Department (ED) today for a psychiatric complaint.    Please return to the ED immediately if you have ANY thoughts of hurting yourself or anyone else, so that we may help you.  Please avoid alcohol and drug use.  Follow up with your doctor and/or therapist as soon as possible regarding today's ED visit.   Please follow up any other recommendations and clinic appointments provided by the psychiatry team that saw you in the Emergency Department.   Major Depressive Disorder Major depressive disorder is a mental illness. It also may be called clinical depression or unipolar depression. Major depressive disorder usually causes feelings of sadness, hopelessness, or helplessness. Some people with this disorder do not feel particularly sad but lose interest in doing things they used to enjoy (anhedonia). Major depressive disorder also can cause physical symptoms. It can interfere with work, school, relationships, and other normal everyday activities. The disorder varies in severity but is longer lasting and more serious than the sadness we all feel from time to time in our lives. Major depressive disorder often is triggered by stressful life events or major life changes. Examples of these triggers include divorce, loss of your job or home, a move, and the death of a family member or close friend. Sometimes this disorder occurs for no obvious reason at all. People who have family members with major depressive disorder or bipolar disorder are at higher risk for developing this disorder, with or without life stressors. Major depressive disorder can occur at any age. It may occur just once in your life (single episode major depressive disorder). It may occur multiple times (recurrent major depressive disorder). SYMPTOMS People with major depressive disorder have either anhedonia or depressed mood on nearly a daily basis for at least 2 weeks or longer. Symptoms of depressed mood  include:  Feelings of sadness (blue or down in the dumps) or emptiness.  Feelings of hopelessness or helplessness.  Tearfulness or episodes of crying (may be observed by others).  Irritability (children and adolescents). In addition to depressed mood or anhedonia or both, people with this disorder have at least four of the following symptoms:  Difficulty sleeping or sleeping too much.   Significant change (increase or decrease) in appetite or weight.   Lack of energy or motivation.  Feelings of guilt and worthlessness.   Difficulty concentrating, remembering, or making decisions.  Unusually slow movement (psychomotor retardation) or restlessness (as observed by others).   Recurrent wishes for death, recurrent thoughts of self-harm (suicide), or a suicide attempt. People with major depressive disorder commonly have persistent negative thoughts about themselves, other people, and the world. People with severe major depressive disorder may experiencedistorted beliefs or perceptions about the world (psychotic delusions). They also may see or hear things that are not real (psychotic hallucinations). DIAGNOSIS Major depressive disorder is diagnosed through an assessment by your health care provider. Your health care provider will ask aboutaspects of your daily life, such as mood,sleep, and appetite, to see if you have the diagnostic symptoms of major depressive disorder. Your health care provider may ask about your medical history and use of alcohol or drugs, including prescription medicines. Your health care provider also may do a physical exam and blood work. This is because certain medical conditions and the use of certain substances can cause major depressive disorder-like symptoms (secondary depression). Your health care provider also may refer you to a mental health specialist for further evaluation and treatment. TREATMENT It is  important to recognize the symptoms of major  depressive disorder and seek treatment. The following treatments can be prescribed for this disorder:   Medicine. Antidepressant medicines usually are prescribed. Antidepressant medicines are thought to correct chemical imbalances in the brain that are commonly associated with major depressive disorder. Other types of medicine may be added if the symptoms do not respond to antidepressant medicines alone or if psychotic delusions or hallucinations occur.  Talk therapy. Talk therapy can be helpful in treating major depressive disorder by providing support, education, and guidance. Certain types of talk therapy also can help with negative thinking (cognitive behavioral therapy) and with relationship issues that trigger this disorder (interpersonal therapy). A mental health specialist can help determine which treatment is best for you. Most people with major depressive disorder do well with a combination of medicine and talk therapy. Treatments involving electrical stimulation of the brain can be used in situations with extremely severe symptoms or when medicine and talk therapy do not work over time. These treatments include electroconvulsive therapy, transcranial magnetic stimulation, and vagal nerve stimulation.   This information is not intended to replace advice given to you by your health care provider. Make sure you discuss any questions you have with your health care provider.   Document Released: 08/28/2012 Document Revised: 05/24/2014 Document Reviewed: 08/28/2012 Elsevier Interactive Patient Education [employer]2016 [employer] Inc.

## 2015-03-16 ENCOUNTER — Emergency Department
Admission: EM | Admit: 2015-03-16 | Discharge: 2015-03-17 | Disposition: A | Discharge status: Home / Self Care | Payer: Commercial Managed Care - HMO | Attending: Student | Admitting: Student

## 2015-03-16 DIAGNOSIS — Z79899 Other long term (current) drug therapy: Secondary | ICD-10-CM | POA: Insufficient documentation

## 2015-03-16 DIAGNOSIS — R45850 Homicidal ideations: Secondary | ICD-10-CM | POA: Insufficient documentation

## 2015-03-16 DIAGNOSIS — I10 Essential (primary) hypertension: Secondary | ICD-10-CM | POA: Diagnosis not present

## 2015-03-16 DIAGNOSIS — Z720 Tobacco use: Secondary | ICD-10-CM | POA: Diagnosis not present

## 2015-03-16 DIAGNOSIS — F339 Major depressive disorder, recurrent, unspecified: Secondary | ICD-10-CM | POA: Diagnosis not present

## 2015-03-16 DIAGNOSIS — R45851 Suicidal ideations: Secondary | ICD-10-CM | POA: Diagnosis not present

## 2015-03-16 DIAGNOSIS — F1310 Sedative, hypnotic or anxiolytic abuse, uncomplicated: Secondary | ICD-10-CM | POA: Insufficient documentation

## 2015-03-16 DIAGNOSIS — F3132 Bipolar disorder, current episode depressed, moderate: Secondary | ICD-10-CM | POA: Diagnosis not present

## 2015-03-16 DIAGNOSIS — F329 Major depressive disorder, single episode, unspecified: Secondary | ICD-10-CM | POA: Diagnosis not present

## 2015-03-16 DIAGNOSIS — Z008 Encounter for other general examination: Secondary | ICD-10-CM | POA: Diagnosis present

## 2015-03-16 LAB — CBC
HCT: 42 % (ref 35.0–47.0)
HEMOGLOBIN: 14.2 g/dL (ref 12.0–16.0)
MCH: 32.9 pg (ref 26.0–34.0)
MCHC: 33.8 g/dL (ref 32.0–36.0)
MCV: 97.4 fL (ref 80.0–100.0)
PLATELETS: 186 10*3/uL (ref 150–440)
RBC: 4.31 MIL/uL (ref 3.80–5.20)
RDW: 14.9 % — ABNORMAL HIGH (ref 11.5–14.5)
WBC: 8.5 10*3/uL (ref 3.6–11.0)

## 2015-03-16 LAB — SALICYLATE LEVEL

## 2015-03-16 LAB — ETHANOL: Alcohol, Ethyl (B): <5 mg/dL (ref <5)

## 2015-03-16 LAB — COMPREHENSIVE METABOLIC PANEL
ALT: 11 U/L — AB (ref 14–54)
ANION GAP: 9 (ref 5–15)
AST: 15 U/L (ref 15–41)
Albumin: 4.8 g/dL (ref 3.5–5.0)
Alkaline Phosphatase: 61 U/L (ref 38–126)
BUN: 19 mg/dL (ref 6–20)
CHLORIDE: 113 mmol/L — AB (ref 101–111)
CO2: 20 mmol/L — AB (ref 22–32)
CREATININE: 1.26 mg/dL — AB (ref 0.44–1.00)
Calcium: 8.9 mg/dL (ref 8.9–10.3)
GFR calc non Af Amer: 43 mL/min — ABNORMAL LOW (ref >60)
GFR, EST AFRICAN AMERICAN: 50 mL/min — AB (ref >60)
Glucose, Bld: 102 mg/dL — ABNORMAL HIGH (ref 65–99)
POTASSIUM: 3.6 mmol/L (ref 3.5–5.1)
SODIUM: 142 mmol/L (ref 135–145)
Total Bilirubin: 0.7 mg/dL (ref 0.3–1.2)
Total Protein: 7.7 g/dL (ref 6.5–8.1)

## 2015-03-16 LAB — ACETAMINOPHEN LEVEL

## 2015-03-16 MED ORDER — TRAZODONE HCL 100 MG PO TABS
ORAL_TABLET | ORAL | Status: AC
  Administered 2015-03-16: 21:00:00
  Filled 2015-03-16: qty 1
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
## 2015-03-17 DIAGNOSIS — F3132 Bipolar disorder, current episode depressed, moderate: Secondary | ICD-10-CM | POA: Diagnosis not present

## 2015-03-17 DIAGNOSIS — R45851 Suicidal ideations: Secondary | ICD-10-CM | POA: Diagnosis not present

## 2015-03-17 DIAGNOSIS — F1310 Sedative, hypnotic or anxiolytic abuse, uncomplicated: Secondary | ICD-10-CM | POA: Diagnosis not present

## 2015-03-17 DIAGNOSIS — F339 Major depressive disorder, recurrent, unspecified: Secondary | ICD-10-CM | POA: Diagnosis not present

## 2015-03-17 DIAGNOSIS — Z720 Tobacco use: Secondary | ICD-10-CM | POA: Diagnosis not present

## 2015-03-17 DIAGNOSIS — I10 Essential (primary) hypertension: Secondary | ICD-10-CM | POA: Diagnosis not present

## 2015-03-17 DIAGNOSIS — Z79899 Other long term (current) drug therapy: Secondary | ICD-10-CM | POA: Diagnosis not present

## 2015-03-17 DIAGNOSIS — R45850 Homicidal ideations: Secondary | ICD-10-CM | POA: Diagnosis not present

## 2015-03-17 LAB — URINALYSIS COMPLETE WITH MICROSCOPIC (ARMC ONLY)
Bacteria, UA: NONE SEEN
Bilirubin Urine: NEGATIVE
GLUCOSE, UA: NEGATIVE mg/dL
Hgb urine dipstick: NEGATIVE
KETONES UR: NEGATIVE mg/dL
Nitrite: NEGATIVE
PROTEIN: NEGATIVE mg/dL
Specific Gravity, Urine: 1.024 (ref 1.005–1.030)
pH: 5 (ref 5.0–8.0)

## 2015-03-17 LAB — URINE DRUG SCREEN, QUALITATIVE (ARMC ONLY)
Amphetamines, Ur Screen: NOT DETECTED
BARBITURATES, UR SCREEN: NOT DETECTED
Benzodiazepine, Ur Scrn: POSITIVE — AB
CANNABINOID 50 NG, UR ~~LOC~~: NOT DETECTED
Cocaine Metabolite,Ur ~~LOC~~: NOT DETECTED
MDMA (ECSTASY) UR SCREEN: NOT DETECTED
Methadone Scn, Ur: NOT DETECTED
Opiate, Ur Screen: NOT DETECTED
PHENCYCLIDINE (PCP) UR S: NOT DETECTED
Tricyclic, Ur Screen: NOT DETECTED

## 2015-03-17 MED ORDER — LORAZEPAM 1 MG PO TABS
1.0000 mg | ORAL_TABLET | Freq: Once | ORAL | Status: AC
  Administered 2015-03-17: 1 mg via ORAL
  Filled 2015-03-17: qty 1

## 2015-03-17 MED ORDER — ESCITALOPRAM OXALATE 10 MG PO TABS
10.0000 mg | ORAL_TABLET | Freq: Every day | ORAL | Status: DC
  Administered 2015-03-17: 10 mg via ORAL
  Filled 2015-03-17: qty 1

## 2015-03-17 MED ORDER — QUETIAPINE FUMARATE 25 MG PO TABS: 100.0000 mg | ORAL_TABLET | Freq: Every day | ORAL | Status: DC

## 2015-03-17 MED ORDER — TRAZODONE HCL 100 MG PO TABS: 100.0000 mg | ORAL_TABLET | Freq: Every evening | ORAL | Status: DC | PRN
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
## 2015-03-17 NOTE — ED Provider Notes (Signed)
-----------------------------------------   11:44 AM on 03/17/2015 -----------------------------------------  This patient has been seen by Dr. Malena Edman15. I spoke with Dr. Garnetta BuddyFaheem directly. Dr. Garnetta BuddyFaheem had seen the patient previously in an office setting and will resume office-based care with this patient. Dr. Garnetta BuddyFaheem has rescinded the involuntary commitment and reports the patient is safe and stable for discharge.  Final diagnosis:  Major depression  Darien Ramusavid W Yaritzel Stange, MD 03/17/15 1146

## 2015-03-17 NOTE — ED Provider Notes (Signed)
-----------------------------------------   6:59 AM on 03/17/2015 -----------------------------------------   Blood pressure 116/65, pulse 86, temperature 98.4 F (36.9 C), temperature source Oral, resp. rate 18, height 5\' 2"  (1.575 m), weight 122 lb (55.339 kg), SpO2 97 %.  The patient had no acute events since last update.  Upset and tearful at this time.  PO Ativan ordered. Disposition is pending per Psychiatry/Behavioral Medicine team recommendations.     Irean HongJade J Sung, MD 03/17/15 0700

## 2015-03-17 NOTE — Discharge Instructions (Signed)
Follow with Dr. Garnetta BuddyFaheem. Return to the emergency department if you have further thoughts of self-harm or other urgent concerns.  Major Depressive Disorder Major depressive disorder is a mental illness. It also may be called clinical depression or unipolar depression. Major depressive disorder usually causes feelings of sadness, hopelessness, or helplessness. Some people with this disorder do not feel particularly sad but lose interest in doing things they used to enjoy (anhedonia). Major depressive disorder also can cause physical symptoms. It can interfere with work, school, relationships, and other normal everyday activities. The disorder varies in severity but is longer lasting and more serious than the sadness we all feel from time to time in our lives. Major depressive disorder often is triggered by stressful life events or major life changes. Examples of these triggers include divorce, loss of your job or home, a move, and the death of a family member or close friend. Sometimes this disorder occurs for no obvious reason at all. People who have family members with major depressive disorder or bipolar disorder are at higher risk for developing this disorder, with or without life stressors. Major depressive disorder can occur at any age. It may occur just once in your life (single episode major depressive disorder). It may occur multiple times (recurrent major depressive disorder). SYMPTOMS People with major depressive disorder have either anhedonia or depressed mood on nearly a daily basis for at least 2 weeks or longer. Symptoms of depressed mood include:  Feelings of sadness (blue or down in the dumps) or emptiness.  Feelings of hopelessness or helplessness.  Tearfulness or episodes of crying (may be observed by others).  Irritability (children and adolescents). In addition to depressed mood or anhedonia or both, people with this disorder have at least four of the following  symptoms:  Difficulty sleeping or sleeping too much.   Significant change (increase or decrease) in appetite or weight.   Lack of energy or motivation.  Feelings of guilt and worthlessness.   Difficulty concentrating, remembering, or making decisions.  Unusually slow movement (psychomotor retardation) or restlessness (as observed by others).   Recurrent wishes for death, recurrent thoughts of self-harm (suicide), or a suicide attempt. People with major depressive disorder commonly have persistent negative thoughts about themselves, other people, and the world. People with severe major depressive disorder may experiencedistorted beliefs or perceptions about the world (psychotic delusions). They also may see or hear things that are not real (psychotic hallucinations). DIAGNOSIS Major depressive disorder is diagnosed through an assessment by your health care provider. Your health care provider will ask aboutaspects of your daily life, such as mood,sleep, and appetite, to see if you have the diagnostic symptoms of major depressive disorder. Your health care provider may ask about your medical history and use of alcohol or drugs, including prescription medicines. Your health care provider also may do a physical exam and blood work. This is because certain medical conditions and the use of certain substances can cause major depressive disorder-like symptoms (secondary depression). Your health care provider also may refer you to a mental health specialist for further evaluation and treatment. TREATMENT It is important to recognize the symptoms of major depressive disorder and seek treatment. The following treatments can be prescribed for this disorder:   Medicine. Antidepressant medicines usually are prescribed. Antidepressant medicines are thought to correct chemical imbalances in the brain that are commonly associated with major depressive disorder. Other types of medicine may be added if the  symptoms do not respond to antidepressant medicines alone or  if psychotic delusions or hallucinations occur.  Talk therapy. Talk therapy can be helpful in treating major depressive disorder by providing support, education, and guidance. Certain types of talk therapy also can help with negative thinking (cognitive behavioral therapy) and with relationship issues that trigger this disorder (interpersonal therapy). A mental health specialist can help determine which treatment is best for you. Most people with major depressive disorder do well with a combination of medicine and talk therapy. Treatments involving electrical stimulation of the brain can be used in situations with extremely severe symptoms or when medicine and talk therapy do not work over time. These treatments include electroconvulsive therapy, transcranial magnetic stimulation, and vagal nerve stimulation.   This information is not intended to replace advice given to you by your health care provider. Make sure you discuss any questions you have with your health care provider.   Document Released: 08/28/2012 Document Revised: 05/24/2014 Document Reviewed: 08/28/2012 Elsevier Interactive Patient Education [employer]
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
## 2015-03-28 ENCOUNTER — Ambulatory Visit: Payer: Commercial Managed Care - HMO | Admitting: Licensed Clinical Social Worker

## 2015-04-01 ENCOUNTER — Encounter: Payer: Self-pay | Admitting: Psychiatry

## 2015-04-01 ENCOUNTER — Ambulatory Visit (INDEPENDENT_AMBULATORY_CARE_PROVIDER_SITE_OTHER): Payer: Commercial Managed Care - HMO | Admitting: Psychiatry

## 2015-04-01 VITALS — BP 122/86 | HR 96 | Temp 98.5°F | Ht 62.0 in | Wt 125.2 lb

## 2015-04-01 DIAGNOSIS — F331 Major depressive disorder, recurrent, moderate: Secondary | ICD-10-CM | POA: Insufficient documentation

## 2015-04-01 DIAGNOSIS — F3160 Bipolar disorder, current episode mixed, unspecified: Secondary | ICD-10-CM | POA: Diagnosis not present

## 2015-04-01 DIAGNOSIS — F1320 Sedative, hypnotic or anxiolytic dependence, uncomplicated: Secondary | ICD-10-CM | POA: Insufficient documentation

## 2015-04-01 MED ORDER — TRAZODONE HCL 50 MG PO TABS: 50.0000 mg | ORAL_TABLET | Freq: Every evening | ORAL | Status: DC | PRN

## 2015-04-01 MED ORDER — ESCITALOPRAM OXALATE 10 MG PO TABS: 10.0000 mg | ORAL_TABLET | Freq: Every day | ORAL | Status: DC

## 2015-04-01 MED ORDER — QUETIAPINE FUMARATE 100 MG PO TABS: 100.0000 mg | ORAL_TABLET | Freq: Every day | ORAL | Status: DC

## 2015-04-01 NOTE — Progress Notes (Signed)
BH MD/PA/NP OP Progress Note  04/01/2015 9:13 AM Deborah [name]  MRN:  [id number]  Subjective:   Patient is a 68 year old married female who presented for the follow-up appointment. She was seen in the emergency room on October 31 and were discharged to follow up here in the outpatient clinic. She was a patient of this practice and was terminated due to her noncompliance with the outpatient appointments. She did not show up for her therapy appointment on Friday. However she came for this appointment and reported that she has been feeling very depressed and has anhedonia and is unable to sleep at night. Patient reported that her family members think that she has problems with her memory as well. She stated that she is not taking any medications at this time and she does not know what happens with the prescription which were given to her at her emergency rooms discharge. She stated that she tries to sleep at the sofa in her living room but has difficulty going to sleep. She stated that she is not taking any medications at this time. She appeared depressed and tearful during the interview. She stated that she smokes approximately 1 pack of cigarettes per day. She denied having any suicidal ideations or plans. She denied having any perceptual disturbances. She was asking for the depression medication as she reported that she feels depressed most of the time. She denied having any mood swings at this time.   Chief Complaint:  Chief Complaint    Follow-up; Medication Refill     Visit Diagnosis:     ICD-9-CM ICD-10-CM   1. Bipolar I disorder, most recent episode mixed (HCC) 296.60 F31.60     Past Medical History:  Past Medical History  Diagnosis Date  . Asthma   . Ulcer   . Heart murmur   . Cystitis   . Arthritis   . Depression   . Spastic colon   . IBS (irritable bowel syndrome)   . Osteoporosis     Past Surgical History  Procedure Laterality Date  . Cholecystectomy    . Spine surgery     . Colonoscopy  2002  . Upper gi endoscopy  2011  . Arm surgery Left 2011    fracture repair  . Throat biopsy   2012  . Shoulder surgery    . Elbow surgery    . Breast biopsy      LCIS   Family History:  Family History  Problem Relation Age of Onset  . Cancer Sister     ovarian  . Cancer Maternal Aunt     breast  . Cancer Maternal Aunt     breast  . Heart attack Mother    Social History:  Social History   Social History  . Marital Status: Married    Spouse Name: N/A  . Number of Children: N/A  . Years of Education: N/A   Social History Main Topics  . Smoking status: Current Every Day Smoker -- 1.00 packs/day for 20 years    Types: Cigarettes    Start date: 03/31/1977  . Smokeless tobacco: Never Used  . Alcohol Use: No     Comment: Ocassional  . Drug Use: No  . Sexual Activity: Not Asked   Other Topics Concern  . None   Social History Narrative   Additional History:   Patient is currently married and lives with her husband.  Assessment:   Musculoskeletal: Strength & Muscle Tone: within normal limits Gait & Station: normal  Patient leans: N/A  Psychiatric Specialty Exam: HPI  Review of Systems  Psychiatric/Behavioral: Positive for depression. The patient is nervous/anxious and has insomnia.     Blood pressure 122/86, pulse 96, temperature 98.5 F (36.9 C), temperature source Tympanic, height 5\' 2"  (1.575 m), weight 125 lb 3.2 oz (56.79 kg), SpO2 94 %.Body mass index is 22.89 kg/(m^2).  General Appearance: Casual  Eye Contact:  Fair  Speech:  Normal Rate  Volume:  Decreased  Mood:  Depressed  Affect:  Congruent and Depressed  Thought Process:  Circumstantial  Orientation:  Full (Time, Place, and Person)  Thought Content:  WDL  Suicidal Thoughts:  No  Homicidal Thoughts:  No  Memory:  Immediate;   Fair  Judgement:  Fair  Insight:  Fair  Psychomotor Activity:  Psychomotor Retardation  Concentration:  Poor  Recall:  Poor  Fund of Knowledge:  Poor  Language: Fair  Akathisia:  No  Handed:  Right  AIMS (if indicated):    Assets:  Communication Skills Desire for Improvement Housing Social Support  ADL's:  Intact  Cognition: WNL  Sleep:     Is the patient at risk to self?  No. Has the patient been a risk to self in the past 6 months?  No. Has the patient been a risk to self within the distant past?  No. Is the patient a risk to others?  No. Has the patient been a risk to others in the past 6 months?  No. Has the patient been a risk to others within the distant past?  No.  Current Medications: Current Outpatient Prescriptions  Medication Sig Dispense Refill  . alendronate (FOSAMAX) 70 MG tablet Take 70 mg by mouth every 7 (seven) days. Take with a full glass of water on an empty stomach.    . diazepam (VALIUM) 2 MG tablet Take 1 tablet (2 mg total) by mouth daily with supper. (Patient not taking: Reported on 04/01/2015) 30 tablet 0  . escitalopram (LEXAPRO) 20 MG tablet Take 1 tablet (20 mg total) by mouth daily. (Patient not taking: Reported on 04/01/2015) 30 tablet 0  . hydrOXYzine (ATARAX/VISTARIL) 25 MG tablet TAKE 1 TABLET BY MOUTH TWICE A DAY AS NEEDED FOR ANXIETY  0  . Naproxen Sodium (ALEVE) 220 MG CAPS Take 1 capsule by mouth as needed.    Marland Kitchen. QUEtiapine (SEROQUEL) 100 MG tablet Take 1 tablet (100 mg total) by mouth at bedtime. (Patient not taking: Reported on 04/01/2015) 30 tablet 0  . topiramate (TOPAMAX) 100 MG tablet Take 1 tablet (100 mg total) by mouth daily. (Patient not taking: Reported on 04/01/2015) 30 tablet 0  . traZODone (DESYREL) 50 MG tablet Take 1 tablet (50 mg total) by mouth at bedtime as needed for sleep. (Patient not taking: Reported on 04/01/2015) 5 tablet 0   No current facility-administered medications for this visit.    Medical Decision Making:  Review of Psycho-Social Stressors (1) and Review and summation of old records (2)  Treatment Plan Summary:Medication management   Depression  I  will start her on Lexapro 10 mg by mouth daily in the morning  Mood symptoms She will take Seroquel 100 mg at bedtime  Insomnia Patient will take trazodone 50 mg at bedtime on a when necessary basis  Follow-up 1 month or earlier   More than 50% of the time spent in psychoeducation, counseling and coordination of care.    This note was generated in part or whole with voice recognition software. Voice regonition is usually  quite accurate but there are transcription errors that can and very often do occur. I apologize for any typographical errors that were not detected and corrected.    Brandy Hale 04/01/2015, 9:13 AM

## 2015-04-08 ENCOUNTER — Other Ambulatory Visit: Payer: Self-pay | Admitting: Psychiatry

## 2015-04-09 ENCOUNTER — Encounter: Payer: Self-pay | Admitting: Emergency Medicine

## 2015-04-09 ENCOUNTER — Ambulatory Visit
Admission: EM | Admit: 2015-04-09 | Discharge: 2015-04-09 | Disposition: A | Discharge status: Home / Self Care | Payer: Commercial Managed Care - HMO | Attending: Family Medicine | Admitting: Family Medicine

## 2015-04-09 DIAGNOSIS — F419 Anxiety disorder, unspecified: Secondary | ICD-10-CM

## 2015-04-09 DIAGNOSIS — F331 Major depressive disorder, recurrent, moderate: Secondary | ICD-10-CM

## 2015-04-09 DIAGNOSIS — F3132 Bipolar disorder, current episode depressed, moderate: Secondary | ICD-10-CM | POA: Diagnosis not present

## 2015-04-09 MED ORDER — QUETIAPINE FUMARATE 100 MG PO TABS: 100.0000 mg | ORAL_TABLET | Freq: Every day | ORAL | Status: DC

## 2015-04-09 MED ORDER — ESCITALOPRAM OXALATE 10 MG PO TABS: 10.0000 mg | ORAL_TABLET | Freq: Every day | ORAL | Status: DC

## 2015-04-09 MED ORDER — TRAZODONE HCL 50 MG PO TABS: 50.0000 mg | ORAL_TABLET | Freq: Every day | ORAL | Status: DC

## 2015-04-09 NOTE — Discharge Instructions (Signed)
Bipolar Disorder °Bipolar disorder is a mental illness. The term bipolar disorder actually is used to describe a group of disorders that all share varying degrees of emotional highs and lows that can interfere with daily functioning, such as work, school, or relationships. Bipolar disorder also can lead to drug abuse, hospitalization, and suicide. °The emotional highs of bipolar disorder are periods of elation or irritability and high energy. These highs can range from a mild form (hypomania) to a severe form (mania). People experiencing episodes of hypomania may appear energetic, excitable, and highly productive. People experiencing mania may behave impulsively or erratically. They often make poor decisions. They may have difficulty sleeping. The most severe episodes of mania can involve having very distorted beliefs or perceptions about the world and seeing or hearing things that are not real (psychotic delusions and hallucinations).  °The emotional lows of bipolar disorder (depression) also can range from mild to severe. Severe episodes of bipolar depression can involve psychotic delusions and hallucinations. °Sometimes people with bipolar disorder experience a state of mixed mood. Symptoms of hypomania or mania and depression are both present during this mixed-mood episode. °SIGNS AND SYMPTOMS °There are signs and symptoms of the episodes of hypomania and mania as well as the episodes of depression. The signs and symptoms of hypomania and mania are similar but vary in severity. They include: °· Inflated self-esteem or feeling of increased self-confidence. °· Decreased need for sleep. °· Unusual talkativeness (rapid or pressured speech) or the feeling of a need to keep talking. °· Sensation of racing thoughts or constant talking, with quick shifts between topics that may or may not be related (flight of ideas). °· Decreased ability to focus or concentrate. °· Increased purposeful activity, such as work, studies,  or social activity, or nonproductive activity, such as pacing, squirming and fidgeting, or finger and toe tapping. °· Impulsive behavior and use of poor judgment, resulting in high-risk activities, such as having unprotected sex or spending excessive amounts of money. °Signs and symptoms of depression include the following:  °· Feelings of sadness, hopelessness, or helplessness. °· Frequent or uncontrollable episodes of crying. °· Lack of feeling anything or caring about anything. °· Difficulty sleeping or sleeping too much.  °· Inability to enjoy the things you used to enjoy.   °· Desire to be alone all the time.   °· Feelings of guilt or worthlessness.  °· Lack of energy or motivation.   °· Difficulty concentrating, remembering, or making decisions.  °· Change in appetite or weight beyond normal fluctuations. °· Thoughts of death or the desire to harm yourself. °DIAGNOSIS  °Bipolar disorder is diagnosed through an assessment by your caregiver. Your caregiver will ask questions about your emotional episodes. There are two main types of bipolar disorder. People with type I bipolar disorder have manic episodes with or without depressive episodes. People with type II bipolar disorder have hypomanic episodes and major depressive episodes, which are more serious than mild depression. The type of bipolar disorder you have can make an important difference in how your illness is monitored and treated. °Your caregiver may ask questions about your medical history and use of alcohol or drugs, including prescription medication. Certain medical conditions and substances also can cause emotional highs and lows that resemble bipolar disorder (secondary bipolar disorder).  °TREATMENT  °Bipolar disorder is a long-term illness. It is best controlled with continuous treatment rather than treatment only when symptoms occur. The following treatments can be prescribed for bipolar disorders: °· Medication--Medication can be prescribed by  a doctor that   is an expert in treating mental disorders (psychiatrists). Medications called mood stabilizers are usually prescribed to help control the illness. Other medications are sometimes added if symptoms of mania, depression, or psychotic delusions and hallucinations occur despite the use of a mood stabilizer. °· Talk therapy--Some forms of talk therapy are helpful in providing support, education, and guidance. °A combination of medication and talk therapy is best for managing the disorder over time. A procedure in which electricity is applied to your brain through your scalp (electroconvulsive therapy) is used in cases of severe mania when medication and talk therapy do not work or work too slowly. °  °This information is not intended to replace advice given to you by your health care provider. Make sure you discuss any questions you have with your health care provider. °  °Document Released: 08/09/2000 Document Revised: 05/24/2014 Document Reviewed: 05/29/2012 °Elsevier Interactive Patient Education ©2016 [employer] ° °

## 2015-04-09 NOTE — ED Provider Notes (Signed)
CSN: [id number]     Arrival date & time 04/09/15  1123 History   First MD Initiated Contact with Patient 04/09/15 1238    Nurses notes were reviewed. Chief Complaint  Patient presents with  . Anxiety   Patient states she is very anxious and needs something to control her anxiety. Apparently she was seen by a psychiatrist on the 15th given 3 prescriptions for Lexapro Seroquel and Desyrel. She has before asked for menstrual dates pain but did not have from the benzodiazepine at this time. Her husband states that the pharmacy will not fill the prescription given to them on 11-15 until 1125. The only reason I convinced visualize wide this cannot be filled now is because she has really used her insurance to get the monthly supply of medication.     (Consider location/radiation/quality/duration/timing/severity/associated sxs/prior Treatment) Patient is a 69 y.o. female presenting with anxiety.  Anxiety This is a chronic problem. The current episode started more than 1 week ago. The problem has not changed since onset.Pertinent negatives include no chest pain, no abdominal pain, no headaches and no shortness of breath. Nothing relieves the symptoms. The treatment provided no relief.    Past Medical History  Diagnosis Date  . Asthma   . Ulcer   . Heart murmur   . Cystitis   . Arthritis   . Depression   . Spastic colon   . IBS (irritable bowel syndrome)   . Osteoporosis    Past Surgical History  Procedure Laterality Date  . Cholecystectomy    . Spine surgery    . Colonoscopy  2002  . Upper gi endoscopy  2011  . Arm surgery Left 2011    fracture repair  . Throat biopsy   2012  . Shoulder surgery    . Elbow surgery    . Breast biopsy      LCIS   Family History  Problem Relation Age of Onset  . Cancer Sister     ovarian  . Cancer Maternal Aunt     breast  . Cancer Maternal Aunt     breast  . Heart attack Mother    Social History  Substance Use Topics  . Smoking status:  Current Every Day Smoker -- 1.00 packs/day for 20 years    Types: Cigarettes    Start date: 03/31/1977  . Smokeless tobacco: Never Used  . Alcohol Use: No     Comment: Ocassional   OB History    Gravida Para Term Preterm AB TAB SAB Ectopic Multiple Living   1         2      Obstetric Comments   Menstrual age: [age]  Age 1st Pregnancy: [age]     Review of Systems  Respiratory: Negative for shortness of breath.   Cardiovascular: Negative for chest pain.  Gastrointestinal: Negative for abdominal pain.  Neurological: Negative for headaches.  All other systems reviewed and are negative.   Allergies  Prednisone  Home Medications   Prior to Admission medications   Medication Sig Start Date End Date Taking? Authorizing Provider  escitalopram (LEXAPRO) 10 MG tablet Take 1 tablet (10 mg total) by mouth daily. 04/01/15  Yes Brandy HaleUzma Faheem, MD  QUEtiapine (SEROQUEL) 100 MG tablet Take 1 tablet (100 mg total) by mouth at bedtime. 04/01/15  Yes Brandy HaleUzma Faheem, MD  traZODone (DESYREL) 50 MG tablet Take 1 tablet (50 mg total) by mouth at bedtime as needed for sleep. 04/01/15  Yes Brandy HaleUzma Faheem, MD  alendronate (  FOSAMAX) 70 MG tablet Take 70 mg by mouth every 7 (seven) days. Take with a full glass of water on an empty stomach.    Historical Provider, MD  escitalopram (LEXAPRO) 10 MG tablet Take 1 tablet (10 mg total) by mouth daily. Warned that this medication will be paid without out-of-pocket money. This prescription is a bridge until her renal prescription can be filled on11/25/2016 04/09/15   Hassan Rowan, MD  Naproxen Sodium (ALEVE) 220 MG CAPS Take 1 capsule by mouth as needed.    Historical Provider, MD  QUEtiapine (SEROQUEL) 100 MG tablet Take 1 tablet (100 mg total) by mouth at bedtime. To be a bridge until regular medication is filled on the 25th. Warned that this prescription refill out of pocket expense. 04/09/15   Hassan Rowan, MD  traZODone (DESYREL) 50 MG tablet Take 1 tablet (50 mg total) by  mouth at bedtime. To be a bridge until her regular medication is filled on 04/11/2015. Warned that this will be purchased using out-of-pocket expense 04/09/15   Hassan Rowan, MD   Meds Ordered and Administered this Visit  Medications - No data to display  BP 146/68 mmHg  Pulse 84  Temp(Src) 97.8 F (36.6 C) (Tympanic)  Resp 16  Ht  (1.6 m)  Wt 125 lb (56.7 kg)  BMI 22.15 kg/m2  SpO2 100% No data found.   Physical Exam  Constitutional: She appears distressed.  HENT:  Head: Normocephalic and atraumatic.  Eyes: Conjunctivae are normal. Pupils are equal, round, and reactive to light.  Neurological: She is alert.  Skin: Skin is warm.  Psychiatric: Her mood appears anxious. Her affect is inappropriate. She is not slowed. Thought content is not delusional. She expresses impulsivity. She expresses no homicidal and no suicidal ideation. She exhibits abnormal recent memory.  Vitals reviewed.   ED Course  Procedures (including critical care time)  Labs Review Labs Reviewed - No data to display  Imaging Review No results found.   Visual Acuity Review  Right Eye Distance:   Left Eye Distance:   Bilateral Distance:    Right Eye Near:   Left Eye Near:    Bilateral Near:         MDM   1. Depression, major, recurrent, moderate (HCC)   2. Bipolar 1 disorder, depressed, moderate (HCC)   3. Anxiety     Patient given 5 days worth of her 3 medication trazodone Seroquel and Lexapro. With instructions that she may fill this out of pocket but it will not be covered by her insurance company since apparently her prescriptions not to be filled until 04/11/2015. Also these 3 medications were filled since there were no control drugs involved. Follow-up PCP or psychiatrist as scheduled     Hassan Rowan, MD 04/09/15 1408

## 2015-04-09 NOTE — ED Notes (Signed)
Patient states that she has been out of her "nerve medication."  Patient states that she can not see her Psychiatrist.  Patient states that her physician told her to come here.

## 2015-04-25 ENCOUNTER — Other Ambulatory Visit: Payer: Self-pay | Admitting: Psychiatry

## 2015-04-30 ENCOUNTER — Encounter: Payer: Self-pay | Admitting: *Deleted

## 2015-05-01 ENCOUNTER — Ambulatory Visit: Payer: Commercial Managed Care - HMO | Admitting: Psychiatry

## 2015-05-13 DIAGNOSIS — B9689 Other specified bacterial agents as the cause of diseases classified elsewhere: Secondary | ICD-10-CM | POA: Diagnosis not present

## 2015-05-13 DIAGNOSIS — R05 Cough: Secondary | ICD-10-CM | POA: Diagnosis not present

## 2015-05-13 DIAGNOSIS — J208 Acute bronchitis due to other specified organisms: Secondary | ICD-10-CM | POA: Diagnosis not present

## 2015-05-13 NOTE — Progress Notes (Signed)
Pharmacy notified.

## 2015-05-15 ENCOUNTER — Ambulatory Visit
Admission: EM | Admit: 2015-05-15 | Discharge: 2015-05-15 | Disposition: A | Discharge status: Home / Self Care | Payer: Commercial Managed Care - HMO | Attending: Family Medicine | Admitting: Family Medicine

## 2015-05-15 ENCOUNTER — Encounter: Payer: Self-pay | Admitting: Emergency Medicine

## 2015-05-15 NOTE — ED Notes (Signed)
Patient states that she can no longer wait to be seen.  Patient left with her daughter.

## 2015-05-15 NOTE — ED Notes (Signed)
Patient saw her PCP on Tuesday and was treated with Augmentin and Albuterol.\ for bronchitis.  Patient states that she is not feeling better and her cough has not improved.

## 2015-05-27 ENCOUNTER — Ambulatory Visit
Admission: EM | Admit: 2015-05-27 | Discharge: 2015-05-27 | Discharge status: Absent without leave | Payer: Commercial Managed Care - HMO

## 2015-05-29 ENCOUNTER — Ambulatory Visit (INDEPENDENT_AMBULATORY_CARE_PROVIDER_SITE_OTHER): Payer: Commercial Managed Care - HMO | Admitting: Psychiatry

## 2015-05-29 ENCOUNTER — Encounter: Payer: Self-pay | Admitting: Psychiatry

## 2015-05-29 ENCOUNTER — Ambulatory Visit: Payer: Commercial Managed Care - HMO | Admitting: Psychiatry

## 2015-05-29 VITALS — BP 100/68 | HR 92 | Temp 98.7°F | Ht 62.0 in | Wt 120.0 lb

## 2015-05-29 DIAGNOSIS — F3160 Bipolar disorder, current episode mixed, unspecified: Secondary | ICD-10-CM | POA: Diagnosis not present

## 2015-05-29 DIAGNOSIS — E538 Deficiency of other specified B group vitamins: Secondary | ICD-10-CM | POA: Diagnosis not present

## 2015-05-29 DIAGNOSIS — F0391 Unspecified dementia with behavioral disturbance: Secondary | ICD-10-CM | POA: Insufficient documentation

## 2015-05-29 DIAGNOSIS — F03918 Unspecified dementia, unspecified severity, with other behavioral disturbance: Secondary | ICD-10-CM | POA: Insufficient documentation

## 2015-05-29 DIAGNOSIS — F0390 Unspecified dementia without behavioral disturbance: Secondary | ICD-10-CM | POA: Diagnosis not present

## 2015-05-29 DIAGNOSIS — F0280 Dementia in other diseases classified elsewhere without behavioral disturbance: Secondary | ICD-10-CM

## 2015-05-29 MED ORDER — ESCITALOPRAM OXALATE 10 MG PO TABS: 10.0000 mg | ORAL_TABLET | Freq: Every day | ORAL | Status: DC

## 2015-05-29 MED ORDER — TRAZODONE HCL 50 MG PO TABS: 50.0000 mg | ORAL_TABLET | Freq: Every day | ORAL | Status: DC

## 2015-05-29 MED ORDER — QUETIAPINE FUMARATE 25 MG PO TABS: 25.0000 mg | ORAL_TABLET | Freq: Two times a day (BID) | ORAL | Status: DC

## 2015-05-29 NOTE — Progress Notes (Signed)
BH MD/PA/NP OP Progress Note  05/29/2015 9:42 AM Deborah [name]  MRN:  [id number]  Subjective:   Patient is a 70 year old married female who presented as a walk-in accompanied by her husband. Patient appeared tired and dizzy and was having difficulty walking. She reported that she did not eat during this morning. She reported that she ran out of all her medications. Her husband reported that she was having flu symptoms in the last couple of weeks and was not feeling well. Patient reported that she is having worsening of her anxiety symptoms as she has been out of her medications. She reported that she wants help with her medications as she feels anxious and has been scratching herself. She has some scratch marks on her hands. She reported that she is unable to sleep well as she ran out of her trazodone. Her husband reported that she was taking 2-3 pills of trazodone at night as she was not able to sleep and then she ran out of her medication. Her husband Are denying the same. She was argumentative with her husband. She reported that she does not take extra pills. She reported that she wants something for her anxiety as well. She continues to remain noncompliant with her medications. She currently denied having any suicidal ideation or plan. She smells strongly of cigarettes. She reported that she is going to make a plan with her daughter who has recently been discharged from the inpatient behavioral health unit after she overdosed, to quit smoking. Patient appeared apprehensive and reported that she wants to restart her medications.  She currently denied having any suicidal homicidal ideations or plans. She denied having any thoughts to hurt anyone else.  Chief Complaint:  Chief Complaint    Follow-up; Medication Refill     Visit Diagnosis:     ICD-9-CM ICD-10-CM   1. Bipolar I disorder, most recent episode mixed (HCC) 296.60 F31.60     Past Medical History:  Past Medical History  Diagnosis Date   . Asthma   . Ulcer   . Heart murmur   . Cystitis   . Arthritis   . Depression   . Spastic colon   . IBS (irritable bowel syndrome)   . Osteoporosis     Past Surgical History  Procedure Laterality Date  . Cholecystectomy    . Spine surgery    . Colonoscopy  2002  . Upper gi endoscopy  2011  . Arm surgery Left 2011    fracture repair  . Throat biopsy   2012  . Shoulder surgery    . Elbow surgery    . Breast biopsy      LCIS   Family History:  Family History  Problem Relation Age of Onset  . Cancer Sister     ovarian  . Cancer Maternal Aunt     breast  . Cancer Maternal Aunt     breast  . Heart attack Mother    Social History:  Social History   Social History  . Marital Status: Married    Spouse Name: N/A  . Number of Children: N/A  . Years of Education: N/A   Social History Main Topics  . Smoking status: Current Every Day Smoker -- 1.00 packs/day for 20 years    Types: Cigarettes    Start date: 03/31/1977  . Smokeless tobacco: Never Used  . Alcohol Use: No     Comment: Ocassional  . Drug Use: No  . Sexual Activity: Not Asked   Other  Topics Concern  . None   Social History Narrative   Additional History:   Patient is currently married and lives with her husband.  Assessment:   Musculoskeletal: Strength & Muscle Tone: within normal limits Gait & Station: normal Patient leans: N/A  Psychiatric Specialty Exam: HPI   Review of Systems  Psychiatric/Behavioral: Positive for depression. The patient is nervous/anxious and has insomnia.     Blood pressure 100/68, pulse 92, temperature 98.7 F (37.1 C), temperature source Tympanic, height 5\' 2"  (1.575 m), weight 120 lb (54.432 kg), SpO2 93 %.Body mass index is 21.94 kg/(m^2).  General Appearance: Casual  Eye Contact:  Fair  Speech:  Normal Rate  Volume:  Decreased  Mood:  Anxious and Depressed  Affect:  Congruent and Depressed  Thought Process:  Circumstantial  Orientation:  Full (Time, Place,  and Person)  Thought Content:  WDL  Suicidal Thoughts:  No  Homicidal Thoughts:  No  Memory:  Immediate;   Fair  Judgement:  Fair  Insight:  Fair  Psychomotor Activity:  Psychomotor Retardation  Concentration:  Poor  Recall:  Poor  Fund of Knowledge: Poor  Language: Fair  Akathisia:  No  Handed:  Right  AIMS (if indicated):    Assets:  Communication Skills Desire for Improvement Housing Social Support  ADL's:  Intact  Cognition: WNL  Sleep:     Is the patient at risk to self?  No. Has the patient been a risk to self in the past 6 months?  No. Has the patient been a risk to self within the distant past?  No. Is the patient a risk to others?  No. Has the patient been a risk to others in the past 6 months?  No. Has the patient been a risk to others within the distant past?  No.  Current Medications: Current Outpatient Prescriptions  Medication Sig Dispense Refill  . alendronate (FOSAMAX) 70 MG tablet Take 70 mg by mouth every 7 (seven) days. Take with a full glass of water on an empty stomach.    . escitalopram (LEXAPRO) 10 MG tablet Take 1 tablet (10 mg total) by mouth daily. Warned that this medication will be paid without out-of-pocket money. This prescription is a bridge until her renal prescription can be filled on11/25/2016 5 tablet 0  . Naproxen Sodium (ALEVE) 220 MG CAPS Take 1 capsule by mouth as needed.    Marland Kitchen QUEtiapine (SEROQUEL) 100 MG tablet Take 1 tablet (100 mg total) by mouth at bedtime. To be a bridge until regular medication is filled on the 25th. Warned that this prescription refill out of pocket expense. 5 tablet 0  . traZODone (DESYREL) 50 MG tablet Take 1 tablet (50 mg total) by mouth at bedtime. To be a bridge until her regular medication is filled on 04/11/2015. Warned that this will be purchased using out-of-pocket expense 5 tablet 0  . VENTOLIN HFA 108 (90 Base) MCG/ACT inhaler INHALE 2 INHALATIONS INTO THE LUNGS EVERY 4 (FOUR) HOURS AS NEEDED FOR WHEEZING.   1   No current facility-administered medications for this visit.    Medical Decision Making:  Review of Psycho-Social Stressors (1) and Review and summation of old records (2)  Treatment Plan Summary:Medication management  Discussed with patient about her medications treatment risk benefits and alternatives. Discussed with the patient's husband about the medication treatment plan. I will dispense her weekly supply of the medication as patient continues to take more than a more prescribed of her medication and will run short  of them and will be calling the office about the refill of her medications  Depression  I will start her on Lexapro 10 mg by mouth daily in the morning- she will be given 7 day supply of the medications with 8 refills.  Mood symptoms She will take Seroquel 35 mg by mouth twice a day for her anxiety symptoms. She will be given 15 pills for a week with 8 refills  Insomnia Patient will take trazodone 50 mg at bedtime for a week with 8 refills  Advised patient about the medications and she demonstrated understanding.  Follow-up 2 month or earlier   More than 50% of the time spent in psychoeducation, counseling and coordination of care.  Time spent with the patient 25 minutes   This note was generated in part or whole with voice recognition software. Voice regonition is usually quite accurate but there are transcription errors that can and very often do occur. I apologize for any typographical errors that were not detected and corrected.    Brandy HaleUzma Missael Ferrari, MD    05/29/2015, 9:42 AM

## 2015-06-02 ENCOUNTER — Other Ambulatory Visit: Payer: Self-pay | Admitting: Psychiatry

## 2015-06-15 ENCOUNTER — Emergency Department
Admission: EM | Admit: 2015-06-15 | Discharge: 2015-06-15 | Disposition: A | Discharge status: Home / Self Care | Payer: Commercial Managed Care - HMO | Attending: Emergency Medicine | Admitting: Emergency Medicine

## 2015-06-15 ENCOUNTER — Encounter: Payer: Self-pay | Admitting: Emergency Medicine

## 2015-06-15 DIAGNOSIS — F17210 Nicotine dependence, cigarettes, uncomplicated: Secondary | ICD-10-CM | POA: Insufficient documentation

## 2015-06-15 DIAGNOSIS — R079 Chest pain, unspecified: Secondary | ICD-10-CM | POA: Insufficient documentation

## 2015-06-15 HISTORY — DX: Anxiety disorder, unspecified: F41.9

## 2015-06-15 NOTE — ED Notes (Signed)
C/o left sided chest pain.  States onset of symptoms approximately 45 min PTA.

## 2015-06-15 NOTE — ED Notes (Addendum)
After completing Triage, patient refusing further treatment.  Discussed with patient plan to draw blood to check troponin, CBC, and chemistries and chest x-ray.  Patient states understanding and refuses to stay for further evaluation and treatment.  Patient's husband witness to conversation and tried to encourage patient to stay for work up.  Patient refused.  Tammy, EDT also present for conversation with patient.  Patient refusing further treatment and states, "I just want to go home."  Unable to sign AMA paperwork, no signature pad on this workstation.

## 2015-06-16 ENCOUNTER — Encounter: Payer: Self-pay | Admitting: Emergency Medicine

## 2015-06-16 ENCOUNTER — Emergency Department
Admission: EM | Admit: 2015-06-16 | Discharge: 2015-06-16 | Disposition: A | Discharge status: Home / Self Care | Payer: Commercial Managed Care - HMO | Attending: Emergency Medicine | Admitting: Emergency Medicine

## 2015-06-16 DIAGNOSIS — Z79899 Other long term (current) drug therapy: Secondary | ICD-10-CM | POA: Diagnosis not present

## 2015-06-16 DIAGNOSIS — I10 Essential (primary) hypertension: Secondary | ICD-10-CM | POA: Diagnosis not present

## 2015-06-16 DIAGNOSIS — F419 Anxiety disorder, unspecified: Secondary | ICD-10-CM | POA: Diagnosis not present

## 2015-06-16 DIAGNOSIS — F3130 Bipolar disorder, current episode depressed, mild or moderate severity, unspecified: Secondary | ICD-10-CM | POA: Diagnosis not present

## 2015-06-16 DIAGNOSIS — F17210 Nicotine dependence, cigarettes, uncomplicated: Secondary | ICD-10-CM | POA: Insufficient documentation

## 2015-06-16 LAB — CBC WITH DIFFERENTIAL/PLATELET
Basophils Absolute: 0.1 10*3/uL (ref 0–0.1)
Basophils Relative: 1 %
EOS PCT: 0 %
Eosinophils Absolute: 0 10*3/uL (ref 0–0.7)
HCT: 44.3 % (ref 35.0–47.0)
Hemoglobin: 14.4 g/dL (ref 12.0–16.0)
LYMPHS PCT: 17 %
Lymphs Abs: 2 10*3/uL (ref 1.0–3.6)
MCH: 31.2 pg (ref 26.0–34.0)
MCHC: 32.5 g/dL (ref 32.0–36.0)
MCV: 95.9 fL (ref 80.0–100.0)
MONO ABS: 0.7 10*3/uL (ref 0.2–0.9)
Monocytes Relative: 6 %
Neutro Abs: 8.8 10*3/uL — ABNORMAL HIGH (ref 1.4–6.5)
Neutrophils Relative %: 76 %
PLATELETS: 169 10*3/uL (ref 150–440)
RBC: 4.62 MIL/uL (ref 3.80–5.20)
RDW: 14.9 % — AB (ref 11.5–14.5)
WBC: 11.7 10*3/uL — ABNORMAL HIGH (ref 3.6–11.0)

## 2015-06-16 LAB — BASIC METABOLIC PANEL
Anion gap: 10 (ref 5–15)
BUN: 18 mg/dL (ref 6–20)
CO2: 22 mmol/L (ref 22–32)
Calcium: 9.5 mg/dL (ref 8.9–10.3)
Chloride: 109 mmol/L (ref 101–111)
Creatinine, Ser: 1.09 mg/dL — ABNORMAL HIGH (ref 0.44–1.00)
GFR calc Af Amer: 59 mL/min — ABNORMAL LOW (ref >60)
GFR, EST NON AFRICAN AMERICAN: 51 mL/min — AB (ref >60)
GLUCOSE: 99 mg/dL (ref 65–99)
Potassium: 4 mmol/L (ref 3.5–5.1)
Sodium: 141 mmol/L (ref 135–145)

## 2015-06-16 LAB — TROPONIN I

## 2015-06-16 MED ORDER — LORAZEPAM 2 MG/ML IJ SOLN
1.0000 mg | Freq: Once | INTRAMUSCULAR | Status: AC
  Administered 2015-06-16: 1 mg via INTRAVENOUS
  Filled 2015-06-16: qty 1

## 2015-06-16 MED ORDER — LORAZEPAM 1 MG PO TABS: 1.0000 mg | ORAL_TABLET | Freq: Two times a day (BID) | ORAL | Status: DC | PRN

## 2015-06-16 MED ORDER — LORAZEPAM 2 MG/ML IJ SOLN: 1.0000 mg | Freq: Once | INTRAMUSCULAR | Status: DC

## 2015-06-16 NOTE — ED Notes (Signed)
MD at bedside. 

## 2015-06-16 NOTE — ED Notes (Signed)
Pt alert and oriented X4, active, cooperative, pt in NAD. RR even and unlabored, color WNL.  Pt informed to return if any life threatening symptoms occur.   

## 2015-06-16 NOTE — Discharge Instructions (Signed)
Panic Attacks °Panic attacks are sudden, short feelings of great fear or discomfort. You may have them for no reason when you are relaxed, when you are uneasy (anxious), or when you are sleeping.  °HOME CARE °· Take all your medicines as told. °· Check with your doctor before starting new medicines. °· Keep all doctor visits. °GET HELP IF: °· You are not able to take your medicines as told. °· Your symptoms do not get better. °· Your symptoms get worse. °GET HELP RIGHT AWAY IF: °· Your attacks seem different than your normal attacks. °· You have thoughts about hurting yourself or others. °· You take panic attack medicine and you have a side effect. °MAKE SURE YOU: °· Understand these instructions. °· Will watch your condition. °· Will get help right away if you are not doing well or get worse. °  °This information is not intended to replace advice given to you by your health care provider. Make sure you discuss any questions you have with your health care provider. °  °Document Released: 06/05/2010 Document Revised: 02/21/2013 Document Reviewed: 12/15/2012 °Elsevier Interactive Patient Education ©2016 [employer]. ° °

## 2015-06-16 NOTE — ED Provider Notes (Signed)
[facility] Emergency Department Provider Note  ____________________________________________   I have reviewed the triage vital signs and the nursing notes.   HISTORY  Chief Complaint Anxiety and Depression    HPI Deborah [name] is a 70 y.o. female states she is having a panic attack. No SI no HI. She denies chest pain or shortness of breath. She would like a IV Ativan dose. Family is with her. No other complaints.  Past Medical History  Diagnosis Date  . Asthma   . Ulcer   . Heart murmur   . Cystitis   . Arthritis   . Depression   . Spastic colon   . IBS (irritable bowel syndrome)   . Osteoporosis   . Anxiety     Patient Active Problem List   Diagnosis Date Noted  . 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 Depression, major, recurrent, moderate (HCC) 04/01/2015  . Bipolar 1 disorder, depressed, moderate (HCC)   . Clinical depression 12/24/2014  . H/O disease 09/08/2014  . History of migraine headaches 09/08/2014  . H/O Bell's palsy 09/08/2014  . H/O: HTN (hypertension) 09/08/2014  . Gastroesophageal reflux disease without esophagitis 09/08/2014  . CAFL (chronic airflow limitation) (HCC) 09/08/2014  . History of asthma 09/08/2014  . 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 Cholecystectomy    . Spine surgery    . Colonoscopy  2002  . Upper gi endoscopy  2011  . Arm surgery Left 2011    fracture repair  . Throat biopsy   2012  . Shoulder surgery    . Elbow surgery    . Breast biopsy      LCIS    Current Outpatient Rx  Name  Route  Sig  Dispense  Refill  . alendronate (FOSAMAX) 70 MG tablet   Oral   Take 70 mg by mouth every 7 (seven) days. Take with a full glass of water on an empty stomach.         . escitalopram (LEXAPRO) 10 MG tablet   Oral   Take 1 tablet (10 mg total) by mouth daily.   7 tablet   8    Please dispense 1 week supply at a time   . LORazepam (ATIVAN) 1 MG tablet   Oral   Take 1 tablet (1 mg total) by mouth 2 (two) times daily as needed for anxiety.   4 tablet   0   . Naproxen Sodium (ALEVE) 220 MG CAPS   Oral   Take 1 capsule by mouth as needed.         Marland Kitchen QUEtiapine (SEROQUEL) 25 MG tablet   Oral   Take 1 tablet (25 mg total) by mouth 2 (two) times daily.   15 tablet   8     Please dispense only 1 week supply at a time.   . traZODone (DESYREL) 50 MG tablet   Oral   Take 1 tablet (50 mg total) by mouth at bedtime.   7 tablet   8     Please dispense 1 week supply at a time.   . VENTOLIN HFA 108 (90 Base) MCG/ACT inhaler      INHALE 2 INHALATIONS INTO THE LUNGS EVERY 4 (FOUR) HOURS AS NEEDED FOR WHEEZING.      1     Dispense as written.     Allergies Prednisone  Family History  Problem Relation  Age of Onset  . Cancer Sister     ovarian  . Cancer Maternal Aunt     breast  . Cancer Maternal Aunt     breast  . Heart attack Mother     Social History Social History  Substance Use Topics  . Smoking status: Current Every Day Smoker -- 1.00 packs/day for 20 years    Types: Cigarettes    Start date: 03/31/1977  . Smokeless tobacco: Never Used  . Alcohol Use: No     Comment: Ocassional    Review of Systems Constitutional: No fever/chills Eyes: No visual changes. ENT: No sore throat. No stiff neck no neck pain Cardiovascular: Denies chest pain. Respiratory: Denies shortness of breath. Gastrointestinal:   no vomiting.  No diarrhea.  No constipation. Genitourinary: Negative for dysuria. Musculoskeletal: Negative lower extremity swelling Skin: Negative for rash. Neurological: Negative for headaches, focal weakness or numbness. 10-point ROS otherwise negative.  ____________________________________________   PHYSICAL EXAM:  VITAL SIGNS: ED Triage Vitals  Enc Vitals Group     BP 06/16/15 0900 136/66 mmHg     Pulse Rate 06/16/15 0900  84     Resp 06/16/15 0900 20     Temp 06/16/15 0900 98.3 F (36.8 C)     Temp Source 06/16/15 0900 Oral     SpO2 06/16/15 0900 98 %     Weight 06/16/15 0900 119 lb (53.978 kg)     Height --      Head Cir --      Peak Flow --      Pain Score 06/16/15 0900 0     Pain Loc --      Pain Edu? --      Excl. in GC? --     Constitutional: Alert and oriented. Well appearing and in no acute distress. Very anxious Eyes: Conjunctivae are normal. PERRL. EOMI. Head: Atraumatic. Nose: No congestion/rhinnorhea. Mouth/Throat: Mucous membranes are moist.  Oropharynx non-erythematous. Neck: No stridor.   Nontender with no meningismus Cardiovascular: Normal rate, regular rhythm. Grossly normal heart sounds.  Good peripheral circulation. Respiratory: Normal respiratory effort.  No retractions. Lungs CTAB. Abdominal: Soft and nontender. No distention. No guarding no rebound Back:  There is no focal tenderness or step off there is no midline tenderness there are no lesions noted. there is no CVA tenderness Musculoskeletal: No lower extremity tenderness. No joint effusions, no DVT signs strong distal pulses no edema Neurologic:  Normal speech and language. No gross focal neurologic deficits are appreciated.  Skin:  Skin is warm, dry and intact. No rash noted. Psychiatric: Mood and affect are anxious. Speech and behavior are normal.  ____________________________________________   LABS (all labs ordered are listed, but only abnormal results are displayed)  Labs Reviewed  CBC WITH DIFFERENTIAL/PLATELET - Abnormal; Notable for the following:    WBC 11.7 (*)    RDW 14.9 (*)    Neutro Abs 8.8 (*)    All other components within normal limits  BASIC METABOLIC PANEL - Abnormal; Notable for the following:    Creatinine, Ser 1.09 (*)    GFR calc non Af Amer 51 (*)    GFR calc Af Amer 59 (*)    All other components within normal limits  TROPONIN I   ____________________________________________  EKG  I  personally interpreted any EKGs ordered by me or triage Normal sinus rhythm at 80 bpm no acute ST elevation or acute ST depression normal axis unremarkable EKG ____________________________________________  RADIOLOGY  I reviewed any imaging  ordered by me or triage that were performed during my shift ____________________________________________   PROCEDURES  Procedure(s) performed: None  Critical Care performed: None  ____________________________________________   INITIAL IMPRESSION / ASSESSMENT AND PLAN / ED COURSE  Pertinent labs & imaging results that were available during my care of the patient were reviewed by me and considered in my medical decision making (see chart for details).  Patient here for anxiety medication no evidence of suicidal thoughts or homicidal thoughts. No evidence of cardiac ischemia causing her symptoms. I did order routine blood work and cleaning a troponin and an EKG as patient stated she had chest pain during a panic attack yesterday. That is negative. She declines further admission or any intervention for me. She is adamant that she wishes to be discharged right now. Her vital signs are reassuring, she has no further anxiety issues. She is asking for something to take at home until she can get her home perception spelled I will give her a prescription for Ativan for acute pills just to get her until she can see her doctor tomorrow. Return precautions and follow-up given and understood. ____________________________________________   FINAL CLINICAL IMPRESSION(S) / ED DIAGNOSES  Final diagnoses:  Anxiety      This chart was dictated using voice recognition software.  Despite best efforts to proofread,  errors can occur which can change meaning.     Jeanmarie Plant, MD 06/16/15 1135

## 2015-06-16 NOTE — ED Notes (Signed)
Pt to ed with c/o anxiety and depression, according to family pt thinks she is out of meds, but actually has plenty at home and can get refill tomorrow.  Pt reports she wants to get meds for her "nerves"  Pt appears anxious at triage, crying periodically. Denies SI, denies HI.

## 2015-06-20 ENCOUNTER — Emergency Department
Admission: EM | Admit: 2015-06-20 | Discharge: 2015-06-20 | Discharge status: Against Medical Advice | Payer: Commercial Managed Care - HMO | Attending: Emergency Medicine | Admitting: Emergency Medicine

## 2015-06-20 DIAGNOSIS — I10 Essential (primary) hypertension: Secondary | ICD-10-CM | POA: Insufficient documentation

## 2015-06-20 DIAGNOSIS — Z79899 Other long term (current) drug therapy: Secondary | ICD-10-CM | POA: Insufficient documentation

## 2015-06-20 DIAGNOSIS — Z5329 Procedure and treatment not carried out because of patient's decision for other reasons: Secondary | ICD-10-CM

## 2015-06-20 DIAGNOSIS — F419 Anxiety disorder, unspecified: Secondary | ICD-10-CM | POA: Insufficient documentation

## 2015-06-20 DIAGNOSIS — F17210 Nicotine dependence, cigarettes, uncomplicated: Secondary | ICD-10-CM | POA: Insufficient documentation

## 2015-06-20 DIAGNOSIS — R55 Syncope and collapse: Secondary | ICD-10-CM | POA: Diagnosis not present

## 2015-06-20 DIAGNOSIS — Z5321 Procedure and treatment not carried out due to patient leaving prior to being seen by health care provider: Secondary | ICD-10-CM | POA: Diagnosis not present

## 2015-06-20 DIAGNOSIS — R4182 Altered mental status, unspecified: Secondary | ICD-10-CM | POA: Diagnosis not present

## 2015-06-20 DIAGNOSIS — Z5320 Procedure and treatment not carried out because of patient's decision for unspecified reasons: Secondary | ICD-10-CM
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
## 2015-06-20 NOTE — Discharge Instructions (Signed)
Please return to emergency room right away should you decide to be evaluated as I recommend strongly, your symptoms recur, or any other new concern or symptom concerning arises.

## 2015-06-20 NOTE — ED Notes (Signed)
Discussed the need for patient to stay and be evaluated. MD Quale spoke to patient about staying for assessments/treatments. Pt refused to stay. Pt reports she feels fine now. Patient requesting to leave. MD Quale preparing AMA paperwork
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
## 2015-06-26 ENCOUNTER — Ambulatory Visit (INDEPENDENT_AMBULATORY_CARE_PROVIDER_SITE_OTHER): Payer: Commercial Managed Care - HMO | Admitting: Psychiatry

## 2015-06-26 ENCOUNTER — Encounter: Payer: Self-pay | Admitting: Psychiatry

## 2015-06-26 ENCOUNTER — Telehealth: Payer: Self-pay | Admitting: Psychiatry

## 2015-06-26 VITALS — BP 124/82 | HR 107 | Temp 98.2°F | Ht 62.0 in | Wt 119.6 lb

## 2015-06-26 DIAGNOSIS — F3160 Bipolar disorder, current episode mixed, unspecified: Secondary | ICD-10-CM | POA: Diagnosis not present

## 2015-06-26 DIAGNOSIS — F607 Dependent personality disorder: Secondary | ICD-10-CM

## 2015-06-26 DIAGNOSIS — F1394 Sedative, hypnotic or anxiolytic use, unspecified with sedative, hypnotic or anxiolytic-induced mood disorder: Secondary | ICD-10-CM | POA: Diagnosis not present

## 2015-06-26 MED ORDER — LORAZEPAM 0.5 MG PO TABS: 0.5000 mg | ORAL_TABLET | Freq: Every day | ORAL | Status: DC

## 2015-06-26 MED ORDER — TRAZODONE HCL 50 MG PO TABS: 50.0000 mg | ORAL_TABLET | Freq: Every day | ORAL | Status: DC
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
## 2015-06-26 NOTE — Telephone Encounter (Signed)
Pt is coming today for follow up.

## 2015-06-30 ENCOUNTER — Telehealth: Payer: Self-pay
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
## 2015-07-01 ENCOUNTER — Ambulatory Visit: Payer: Commercial Managed Care - HMO | Admitting: Psychiatry

## 2015-07-03 ENCOUNTER — Emergency Department
Admission: EM | Admit: 2015-07-03 | Discharge: 2015-07-03 | Disposition: A | Discharge status: Other Acute Inpatient Hospital | Payer: Commercial Managed Care - HMO | Attending: Emergency Medicine | Admitting: Emergency Medicine

## 2015-07-03 DIAGNOSIS — F17210 Nicotine dependence, cigarettes, uncomplicated: Secondary | ICD-10-CM | POA: Diagnosis not present

## 2015-07-03 DIAGNOSIS — T424X1A Poisoning by benzodiazepines, accidental (unintentional), initial encounter: Secondary | ICD-10-CM | POA: Diagnosis not present

## 2015-07-03 DIAGNOSIS — F3130 Bipolar disorder, current episode depressed, mild or moderate severity, unspecified: Secondary | ICD-10-CM | POA: Insufficient documentation

## 2015-07-03 DIAGNOSIS — Y9289 Other specified places as the place of occurrence of the external cause: Secondary | ICD-10-CM | POA: Diagnosis not present

## 2015-07-03 DIAGNOSIS — F332 Major depressive disorder, recurrent severe without psychotic features: Secondary | ICD-10-CM | POA: Insufficient documentation

## 2015-07-03 DIAGNOSIS — F29 Unspecified psychosis not due to a substance or known physiological condition: Secondary | ICD-10-CM | POA: Diagnosis not present

## 2015-07-03 DIAGNOSIS — F17200 Nicotine dependence, unspecified, uncomplicated: Secondary | ICD-10-CM | POA: Diagnosis not present

## 2015-07-03 DIAGNOSIS — F3132 Bipolar disorder, current episode depressed, moderate: Secondary | ICD-10-CM | POA: Diagnosis not present

## 2015-07-03 DIAGNOSIS — T424X4A Poisoning by benzodiazepines, undetermined, initial encounter: Secondary | ICD-10-CM | POA: Diagnosis not present

## 2015-07-03 DIAGNOSIS — F419 Anxiety disorder, unspecified: Secondary | ICD-10-CM

## 2015-07-03 DIAGNOSIS — T450X1A Poisoning by antiallergic and antiemetic drugs, accidental (unintentional), initial encounter: Secondary | ICD-10-CM | POA: Diagnosis not present

## 2015-07-03 DIAGNOSIS — F03918 Unspecified dementia, unspecified severity, with other behavioral disturbance: Secondary | ICD-10-CM | POA: Diagnosis present

## 2015-07-03 DIAGNOSIS — F0280 Dementia in other diseases classified elsewhere without behavioral disturbance: Secondary | ICD-10-CM

## 2015-07-03 DIAGNOSIS — I10 Essential (primary) hypertension: Secondary | ICD-10-CM | POA: Diagnosis not present

## 2015-07-03 DIAGNOSIS — T50901A Poisoning by unspecified drugs, medicaments and biological substances, accidental (unintentional), initial encounter: Secondary | ICD-10-CM

## 2015-07-03 DIAGNOSIS — Y998 Other external cause status: Secondary | ICD-10-CM | POA: Diagnosis not present

## 2015-07-03 DIAGNOSIS — Z888 Allergy status to other drugs, medicaments and biological substances status: Secondary | ICD-10-CM | POA: Diagnosis not present

## 2015-07-03 DIAGNOSIS — T50904A Poisoning by unspecified drugs, medicaments and biological substances, undetermined, initial encounter: Secondary | ICD-10-CM

## 2015-07-03 DIAGNOSIS — F0391 Unspecified dementia with behavioral disturbance: Secondary | ICD-10-CM | POA: Diagnosis present

## 2015-07-03 DIAGNOSIS — Y9389 Activity, other specified: Secondary | ICD-10-CM | POA: Insufficient documentation

## 2015-07-03 DIAGNOSIS — T391X4A Poisoning by 4-Aminophenol derivatives, undetermined, initial encounter: Secondary | ICD-10-CM | POA: Diagnosis not present

## 2015-07-03 LAB — CBC WITH DIFFERENTIAL/PLATELET
BASOS ABS: 0 10*3/uL (ref 0–0.1)
BASOS PCT: 1 %
EOS ABS: 0.1 10*3/uL (ref 0–0.7)
Eosinophils Relative: 1 %
HEMATOCRIT: 42.5 % (ref 35.0–47.0)
Hemoglobin: 14.3 g/dL (ref 12.0–16.0)
Lymphocytes Relative: 27 %
Lymphs Abs: 2.3 10*3/uL (ref 1.0–3.6)
MCH: 32.2 pg (ref 26.0–34.0)
MCHC: 33.8 g/dL (ref 32.0–36.0)
MCV: 95.5 fL (ref 80.0–100.0)
MONO ABS: 0.6 10*3/uL (ref 0.2–0.9)
MONOS PCT: 7 %
NEUTROS ABS: 5.4 10*3/uL (ref 1.4–6.5)
Neutrophils Relative %: 64 %
Platelets: 198 10*3/uL (ref 150–440)
RBC: 4.45 MIL/uL (ref 3.80–5.20)
RDW: 15.5 % — AB (ref 11.5–14.5)
WBC: 8.4 10*3/uL (ref 3.6–11.0)

## 2015-07-03 LAB — COMPREHENSIVE METABOLIC PANEL
ALBUMIN: 4.1 g/dL (ref 3.5–5.0)
ALT: 18 U/L (ref 14–54)
AST: 20 U/L (ref 15–41)
Alkaline Phosphatase: 66 U/L (ref 38–126)
Anion gap: 8 (ref 5–15)
BUN: 20 mg/dL (ref 6–20)
CHLORIDE: 107 mmol/L (ref 101–111)
CO2: 25 mmol/L (ref 22–32)
Calcium: 9.2 mg/dL (ref 8.9–10.3)
Creatinine, Ser: 1.12 mg/dL — ABNORMAL HIGH (ref 0.44–1.00)
GFR calc Af Amer: 57 mL/min — ABNORMAL LOW (ref >60)
GFR calc non Af Amer: 49 mL/min — ABNORMAL LOW (ref >60)
GLUCOSE: 92 mg/dL (ref 65–99)
POTASSIUM: 3.7 mmol/L (ref 3.5–5.1)
SODIUM: 140 mmol/L (ref 135–145)
Total Bilirubin: 0.7 mg/dL (ref 0.3–1.2)
Total Protein: 7.3 g/dL (ref 6.5–8.1)

## 2015-07-03 LAB — TSH: TSH: 1.821 u[IU]/mL (ref 0.350–4.500)

## 2015-07-03 LAB — ETHANOL: Alcohol, Ethyl (B): <5 mg/dL (ref <5)

## 2015-07-03 MED ORDER — DIAZEPAM 5 MG PO TABS
ORAL_TABLET | ORAL | Status: AC
  Administered 2015-07-03: 5 mg via ORAL
  Filled 2015-07-03: qty 1

## 2015-07-03 MED ORDER — DIAZEPAM 5 MG PO TABS
5.0000 mg | ORAL_TABLET | Freq: Once | ORAL | Status: AC
  Administered 2015-07-03: 5 mg via ORAL
  Filled 2015-07-03: qty 1

## 2015-07-03 MED ORDER — DIAZEPAM 5 MG PO TABS
5.0000 mg | ORAL_TABLET | Freq: Once | ORAL | Status: AC
  Administered 2015-07-03: 5 mg via ORAL

## 2015-07-03 NOTE — ED Notes (Signed)
Pt arrives via [facility] from home  EMS reports that the pt took 5 Benadryl and 1 klonipin prior to their arrival to her home   She is alert and oriented upon arrival  Pt states  "I just wanted to get some sleep - I did not want to hurt myself."

## 2015-07-03 NOTE — ED Notes (Addendum)
2/2 bags of belongings returned to her upon leaving with [employer] Officer Brown   3 individual rings given back to her at this time  Pt placed them on her fingers prior to leaving our building

## 2015-07-03 NOTE — ED Notes (Signed)
Pt given supper tray and drink

## 2015-07-03 NOTE — Telephone Encounter (Signed)
Called Deborah [name], Her mail box was full. Received a message from Triage nurse nirse this morning as pt called reporting that she is depressed and not doing well. Unable to contact pt.  She was advised to go EMS/911 but pt disagreed with the instructions.

## 2015-07-03 NOTE — ED Notes (Signed)
BEHAVIORAL HEALTH ROUNDING Patient sleeping: No. Patient alert and oriented: yes Behavior appropriate: Yes.  ; If no, describe:  Nutrition and fluids offered: yes Toileting and hygiene offered: Yes  Sitter present: q15 minute observations and security  monitoring Law enforcement present: Yes  ODS  
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
## 2015-07-03 NOTE — BH Assessment (Signed)
Referral information for Geriatric Placement have been faxed to;    [facility]([phone]),    [phone]),    [facility] [phone])   [phone]),    [facility]([phone]),    [facility]([phone]).

## 2015-07-03 NOTE — ED Notes (Signed)
Pt has been talking on the phone for well over the 10 minute time limit - she brings me the phone and someone stating that they are her daughter  Asks  "i want to know what ya'll are gonna do - i want to know what my mothers labs are."  The person on the phone informed that i cannot give out any information due to privacy laws - the daughter states  "well you are a fucking bitch - and then she hung up
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
## 2015-07-03 NOTE — ED Notes (Signed)
Report called to Librarian, academic at [facility] [phone]

## 2015-07-03 NOTE — ED Notes (Signed)
Pt given lunch tray and drink 

## 2015-07-03 NOTE — ED Notes (Signed)
3 individual gold colored rings removed and placed in specimen cup and placed in the bag with her gown and housecoat that she arrived here in - bedroom shoes are also in bag

## 2015-07-03 NOTE — ED Notes (Signed)
Now a man is on the phone demanding to talk with her nurse - I also informed him and the pt that I cannot give out information due to privacy laws

## 2015-07-03 NOTE — BH Assessment (Addendum)
Patient has been accepted to [facility].  Accepting physician is Dr. Karis Juba.  Call report to [phone].  Representative was [employer] .  ER Staff is aware of it Elder Love, ER Sect.; Dr. Pershing Proud, ER MD & Amy T. Patient's Nurse).   Accepting Facility ([facility]) request report be called, when patient is physically leaving the building and not before.    [facility] request patient arrived before 11pm. If it's after 11pm she will have to come next day (07/04/2015) after 6am. They do NOT accept admission between 11pm-6am.

## 2015-07-10 ENCOUNTER — Ambulatory Visit: Payer: Commercial Managed Care - HMO | Admitting: Psychiatry

## 2015-07-16 ENCOUNTER — Encounter: Payer: Self-pay | Admitting: Emergency Medicine

## 2015-07-16 ENCOUNTER — Emergency Department
Admission: EM | Admit: 2015-07-16 | Discharge: 2015-07-16 | Disposition: A | Discharge status: Home / Self Care | Payer: Commercial Managed Care - HMO | Attending: Emergency Medicine | Admitting: Emergency Medicine

## 2015-07-16 ENCOUNTER — Emergency Department: Payer: Commercial Managed Care - HMO

## 2015-07-16 DIAGNOSIS — Z79899 Other long term (current) drug therapy: Secondary | ICD-10-CM | POA: Insufficient documentation

## 2015-07-16 DIAGNOSIS — F3132 Bipolar disorder, current episode depressed, moderate: Secondary | ICD-10-CM | POA: Insufficient documentation

## 2015-07-16 DIAGNOSIS — F419 Anxiety disorder, unspecified: Secondary | ICD-10-CM | POA: Insufficient documentation

## 2015-07-16 DIAGNOSIS — I10 Essential (primary) hypertension: Secondary | ICD-10-CM | POA: Diagnosis not present

## 2015-07-16 DIAGNOSIS — F17210 Nicotine dependence, cigarettes, uncomplicated: Secondary | ICD-10-CM | POA: Diagnosis not present

## 2015-07-16 DIAGNOSIS — R079 Chest pain, unspecified: Secondary | ICD-10-CM | POA: Diagnosis not present

## 2015-07-16 DIAGNOSIS — R0602 Shortness of breath: Secondary | ICD-10-CM | POA: Diagnosis not present

## 2015-07-16 LAB — CBC
HCT: 39.1 % (ref 35.0–47.0)
HEMOGLOBIN: 13.2 g/dL (ref 12.0–16.0)
MCH: 32.7 pg (ref 26.0–34.0)
MCHC: 33.7 g/dL (ref 32.0–36.0)
MCV: 97 fL (ref 80.0–100.0)
PLATELETS: 185 10*3/uL (ref 150–440)
RBC: 4.03 MIL/uL (ref 3.80–5.20)
RDW: 15.2 % — ABNORMAL HIGH (ref 11.5–14.5)
WBC: 9.7 10*3/uL (ref 3.6–11.0)

## 2015-07-16 LAB — URINALYSIS COMPLETE WITH MICROSCOPIC (ARMC ONLY)
Bacteria, UA: NONE SEEN
Bilirubin Urine: NEGATIVE
Glucose, UA: NEGATIVE mg/dL
HGB URINE DIPSTICK: NEGATIVE
Ketones, ur: NEGATIVE mg/dL
Leukocytes, UA: NEGATIVE
Nitrite: NEGATIVE
PROTEIN: NEGATIVE mg/dL
Specific Gravity, Urine: 1.01 (ref 1.005–1.030)
pH: 7 (ref 5.0–8.0)

## 2015-07-16 LAB — COMPREHENSIVE METABOLIC PANEL
ALK PHOS: 59 U/L (ref 38–126)
ALT: 29 U/L (ref 14–54)
AST: 18 U/L (ref 15–41)
Albumin: 4 g/dL (ref 3.5–5.0)
Anion gap: 7 (ref 5–15)
BUN: 21 mg/dL — AB (ref 6–20)
CHLORIDE: 111 mmol/L (ref 101–111)
CO2: 23 mmol/L (ref 22–32)
CREATININE: 1.05 mg/dL — AB (ref 0.44–1.00)
Calcium: 8.7 mg/dL — ABNORMAL LOW (ref 8.9–10.3)
GFR calc Af Amer: >60 mL/min (ref >60)
GFR, EST NON AFRICAN AMERICAN: 53 mL/min — AB (ref >60)
Glucose, Bld: 103 mg/dL — ABNORMAL HIGH (ref 65–99)
Potassium: 3.5 mmol/L (ref 3.5–5.1)
SODIUM: 141 mmol/L (ref 135–145)
Total Bilirubin: 0.7 mg/dL (ref 0.3–1.2)
Total Protein: 6.9 g/dL (ref 6.5–8.1)

## 2015-07-16 LAB — TROPONIN I: Troponin I: <0.03 ng/mL (ref <0.031)

## 2015-07-16 MED ORDER — ASPIRIN EC 325 MG PO TBEC
325.0000 mg | DELAYED_RELEASE_TABLET | Freq: Once | ORAL | Status: AC
  Administered 2015-07-16: 325 mg via ORAL
  Filled 2015-07-16: qty 1

## 2015-07-16 NOTE — ED Notes (Signed)
In to introduce myself to patient and she states "Unhook me I am going home." MD notified who states she is good to go home. Medical equipment removed and patient discharged. To front via wheelchair.

## 2015-07-16 NOTE — Discharge Instructions (Signed)
Please make an appointment with your primary care physician, as well as with the cardiologist.  Please call Dr. Arida's office to schedule an outpatient stress test. ° °Return to the emergency department if he developed worsening chest pain, shortness of breath, lightheadedness or fainting, or any other symptoms concerning to you. °

## 2015-07-16 NOTE — ED Provider Notes (Addendum)
[facility] Emergency Department Provider Note  ____________________________________________  Time seen: Approximately 10:48 PM  I have reviewed the triage vital signs and the nursing notes.   HISTORY  Chief Complaint Chest Pain    HPI Deborah [name] is a 70 y.o. female with a history of asthma, HTN, GERD, severe anxiety, depression, bipolar disorder presenting for chest pain. Patient reports that she was sitting watching TV at 8 PM when she had the acute onset of a "sharp" left-sided chest pain which has now completely resolved. She denies any radiation, palpitations, associated shortness of breath, nausea or vomiting, diaphoresis and she has not had any recent cough or cold symptoms, fever or chills. No abdominal pain. The pain resolved on its own. Her husband feels that she was very anxious during the episode. He reports that the patient was seen here with a similar but less severe episode 3 months ago which was thought to be due to anxiety. She never followed up with her primary care physician or cardiologist.   Past Medical History  Diagnosis Date  . Asthma   . Ulcer   . Heart murmur   . Cystitis   . Arthritis   . Depression   . Spastic colon   . IBS (irritable bowel syndrome)   . Osteoporosis   . Anxiety     Patient Active Problem List   Diagnosis Date Noted  . Overdose 07/03/2015  . Mild dementia 05/29/2015  . 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 Depression, major, recurrent, moderate (HCC) 04/01/2015  . Bipolar 1 disorder, depressed, moderate (HCC)   . Clinical depression 12/24/2014  . H/O disease 09/08/2014  . History of migraine headaches 09/08/2014  . H/O Bell's palsy 09/08/2014  . H/O: HTN (hypertension) 09/08/2014  . Gastroesophageal reflux disease without esophagitis 09/08/2014  . CAFL (chronic airflow limitation) (HCC) 09/08/2014  . History of asthma 09/08/2014  . Breast  neoplasm, Tis (LCIS) 01/23/2013    Past Surgical History  Procedure Laterality Date  . Cholecystectomy    . Spine surgery    . Colonoscopy  2002  . Upper gi endoscopy  2011  . Arm surgery Left 2011    fracture repair  . Throat biopsy   2012  . Shoulder surgery    . Elbow surgery    . Breast biopsy      LCIS    Current Outpatient Rx  Name  Route  Sig  Dispense  Refill  . alendronate (FOSAMAX) 70 MG tablet   Oral   Take 70 mg by mouth every 7 (seven) days. Reported on 06/26/2015         . escitalopram (LEXAPRO) 10 MG tablet   Oral   Take 1 tablet (10 mg total) by mouth daily. Patient not taking: Reported on 06/26/2015   7 tablet   8     Please dispense 1 week supply at a time   . LORazepam (ATIVAN) 0.5 MG tablet   Oral   Take 1 tablet (0.5 mg total) by mouth at bedtime. Called into pharmacy.   7 tablet   5   . Naproxen Sodium (ALEVE) 220 MG CAPS   Oral   Take 1 capsule by mouth as needed. Reported on 06/26/2015         . QUEtiapine (SEROQUEL) 25 MG tablet   Oral   Take 1 tablet (25 mg total) by mouth 2 (two) times daily. Patient not taking: Reported on 06/26/2015  15 tablet   8     Please dispense only 1 week supply at a time.   . traZODone (DESYREL) 50 MG tablet   Oral   Take 1 tablet (50 mg total) by mouth at bedtime.   7 tablet   8     Please dispense 1 week supply at a time.   Terald Sleeper HFA 108 (90 Base) MCG/ACT inhaler      Reported on 06/26/2015      1     Dispense as written.     Allergies Prednisone  Family History  Problem Relation Age of Onset  . Cancer Sister     ovarian  . Cancer Maternal Aunt     breast  . Cancer Maternal Aunt     breast  . Heart attack Mother     Social History Social History  Substance Use Topics  . Smoking status: Current Every Day Smoker -- 1.00 packs/day for 20 years    Types: Cigarettes    Start date: 03/31/1977  . Smokeless tobacco: Never Used  . Alcohol Use: No     Comment: Ocassional     Review of Systems Constitutional: No fever/chills. No lightheadedness or syncope. Eyes: No visual changes. ENT: No sore throat. Cardiovascular: Positive chest pain, without palpitations. Respiratory: Denies shortness of breath.  No cough. Gastrointestinal: No abdominal pain.  No nausea, no vomiting.  No diarrhea.  No constipation. Genitourinary: Negative for dysuria. Musculoskeletal: Negative for back pain. Skin: Negative for rash. Neurological: Negative for headaches, focal weakness or numbness.  10-point ROS otherwise negative.  ____________________________________________   PHYSICAL EXAM:  VITAL SIGNS: ED Triage Vitals  Enc Vitals Group     BP 07/16/15 2152 180/90 mmHg     Pulse Rate 07/16/15 2152 78     Resp 07/16/15 2152 20     Temp 07/16/15 2152 97.7 F (36.5 C)     Temp Source 07/16/15 2152 Oral     SpO2 07/16/15 2152 98 %     Weight 07/16/15 2152 123 lb (55.792 kg)     Height 07/16/15 2152 5\' 3"  (1.6 m)     Head Cir --      Peak Flow --      Pain Score 07/16/15 2154 8     Pain Loc --      Pain Edu? --      Excl. in GC? --     Constitutional: Patient is alert and oriented and answering questions appropriately. She is chronically ill-appearing and speaks with an anxious affect but has good judgment.  Eyes: Conjunctivae are normal.  EOMI. Head: Atraumatic. Nose: No congestion/rhinnorhea. Mouth/Throat: Mucous membranes are moist.  Neck: No stridor.  Supple.  No JVD. Cardiovascular: Normal rate, regular rhythm. No murmurs, rubs or gallops.  Respiratory: Normal respiratory effort.  No retractions. Lungs CTAB.  No wheezes, rales or ronchi. Gastrointestinal: Soft and nontender. No distention. No peritoneal signs. Musculoskeletal: No LE edema. No palpable cords or tenderness to palpation in the calves. Negative Homans sign. Neurologic:  Normal speech and language. No gross focal neurologic deficits are appreciated.  Skin:  Skin is warm, dry and intact. No rash  noted. Psychiatric: Depressed mood with anxious affect.Marland Kitchen Speech and behavior are normal.  Normal judgement.  ____________________________________________   LABS (all labs ordered are listed, but only abnormal results are displayed)  Labs Reviewed  CBC - Abnormal; Notable for the following:    RDW 15.2 (*)    All other components within normal  limits  COMPREHENSIVE METABOLIC PANEL - Abnormal; Notable for the following:    Glucose, Bld 103 (*)    BUN 21 (*)    Creatinine, Ser 1.05 (*)    Calcium 8.7 (*)    GFR calc non Af Amer 53 (*)    All other components within normal limits  TROPONIN I  URINALYSIS COMPLETEWITH MICROSCOPIC ([facility] ONLY)  TROPONIN I   ____________________________________________  EKG  ED ECG REPORT I, Rockne Menghini, the attending physician, personally viewed and interpreted this ECG.   Date: 07/16/2015  EKG Time: 2156  Rate: 77  Rhythm: normal sinus rhythm  Axis: Normal  Intervals:none  ST&T Change: Nonspecific T-wave inversions in V1. No ST elevation.  ____________________________________________  RADIOLOGY  Dg Chest 2 View  07/16/2015  CLINICAL DATA:  Central to left CP with SOB x 2 hours, PT very anxious EXAM: CHEST  2 VIEW COMPARISON:  02/24/2012 FINDINGS: Cardiac silhouette is normal in size and configuration. Normal mediastinal and hilar contours. Clear lungs.  No pleural effusion or pneumothorax. Bony thorax is demineralized. There changes from previous left rotator cuff surgery. IMPRESSION: No active cardiopulmonary disease. Electronically Signed   By: Amie Portland M.D.   On: 07/16/2015 22:23    ____________________________________________   PROCEDURES  Procedure(s) performed: None  Critical Care performed: No ____________________________________________   INITIAL IMPRESSION / ASSESSMENT AND PLAN / ED COURSE  Pertinent labs & imaging results that were available during my care of the patient were reviewed by me and considered  in my medical decision making (see chart for details).  70 y.o. female presenting with chest pain while at rest. The patient has reassuring vital signs, reassuring exam, and EKG without ischemic changes, and a negative troponin. The likelihood that this is ACS or MI is fairly low, and I will repeat a second troponin so that we are more than 4 hours from the onset of symptoms. I have ordered a spoken to the family about follow-up with their primary care physician as well as a cardiologist for further cardiac risk stratification if her second troponin is negative. The patient will be signed out to the oncoming physician who will follow the second troponin and make a final disposition.  ----------------------------------------- 11:19 PM on 07/16/2015 -----------------------------------------  The patient does not want to wait for a second troponin. I'll plan to discharge her now and she understands return precautions as well as follow-up instructions. ____________________________________________  FINAL CLINICAL IMPRESSION(S) / ED DIAGNOSES  Final diagnoses:  Chest pain, unspecified chest pain type      NEW MEDICATIONS STARTED DURING THIS VISIT:  New Prescriptions   No medications on file     Rockne Menghini, MD 07/16/15 2256  Rockne Menghini, MD 07/16/15 2319

## 2015-07-16 NOTE — ED Notes (Signed)
Patient transported to X-ray 

## 2015-07-16 NOTE — ED Notes (Signed)
Pt arrived to the ED accompanied by her husband for complaints of chest pain Pt was was brought back to a room since she was hurting really bad and was unable to sit straight. Pt has no previous cardiac history, AOx4 in moderate pain distress very anxious during triage.

## 2015-07-16 NOTE — ED Notes (Signed)
MD at bedside. 

## 2015-07-18 ENCOUNTER — Telehealth: Payer: Self-pay | Admitting: Cardiovascular Disease

## 2015-07-18 NOTE — Telephone Encounter (Signed)
lmov to call back and make ED fu apt

## 2015-07-20 ENCOUNTER — Other Ambulatory Visit: Payer: Self-pay | Admitting: Psychiatry

## 2015-07-22 ENCOUNTER — Ambulatory Visit: Payer: Commercial Managed Care - HMO | Admitting: Psychiatry

## 2015-07-23 ENCOUNTER — Telehealth: Payer: Self-pay | Admitting: Psychiatry

## 2015-07-23 ENCOUNTER — Emergency Department
Admission: EM | Admit: 2015-07-23 | Discharge: 2015-07-23 | Disposition: A | Discharge status: Home / Self Care | Payer: Commercial Managed Care - HMO | Attending: Student | Admitting: Student

## 2015-07-23 ENCOUNTER — Encounter: Payer: Self-pay | Admitting: Emergency Medicine

## 2015-07-23 DIAGNOSIS — F419 Anxiety disorder, unspecified: Secondary | ICD-10-CM | POA: Insufficient documentation

## 2015-07-23 DIAGNOSIS — F29 Unspecified psychosis not due to a substance or known physiological condition: Secondary | ICD-10-CM | POA: Diagnosis not present

## 2015-07-23 DIAGNOSIS — F17210 Nicotine dependence, cigarettes, uncomplicated: Secondary | ICD-10-CM | POA: Insufficient documentation

## 2015-07-23 DIAGNOSIS — F319 Bipolar disorder, unspecified: Secondary | ICD-10-CM | POA: Diagnosis not present

## 2015-07-23 DIAGNOSIS — F0391 Unspecified dementia with behavioral disturbance: Secondary | ICD-10-CM | POA: Diagnosis present

## 2015-07-23 DIAGNOSIS — Z008 Encounter for other general examination: Secondary | ICD-10-CM | POA: Diagnosis present

## 2015-07-23 DIAGNOSIS — F411 Generalized anxiety disorder: Secondary | ICD-10-CM

## 2015-07-23 DIAGNOSIS — Z79899 Other long term (current) drug therapy: Secondary | ICD-10-CM | POA: Insufficient documentation

## 2015-07-23 DIAGNOSIS — F0280 Dementia in other diseases classified elsewhere without behavioral disturbance: Secondary | ICD-10-CM

## 2015-07-23 DIAGNOSIS — I10 Essential (primary) hypertension: Secondary | ICD-10-CM | POA: Diagnosis not present

## 2015-07-23 DIAGNOSIS — F03918 Unspecified dementia, unspecified severity, with other behavioral disturbance: Secondary | ICD-10-CM | POA: Diagnosis present

## 2015-07-23 DIAGNOSIS — R251 Tremor, unspecified: Secondary | ICD-10-CM | POA: Insufficient documentation

## 2015-07-23 LAB — CBC
HCT: 42.2 % (ref 35.0–47.0)
Hemoglobin: 14.3 g/dL (ref 12.0–16.0)
MCH: 32.3 pg (ref 26.0–34.0)
MCHC: 33.9 g/dL (ref 32.0–36.0)
MCV: 95.3 fL (ref 80.0–100.0)
PLATELETS: 203 10*3/uL (ref 150–440)
RBC: 4.43 MIL/uL (ref 3.80–5.20)
RDW: 15.3 % — AB (ref 11.5–14.5)
WBC: 9.6 10*3/uL (ref 3.6–11.0)

## 2015-07-23 LAB — COMPREHENSIVE METABOLIC PANEL
ALBUMIN: 4.4 g/dL (ref 3.5–5.0)
ALT: 13 U/L — ABNORMAL LOW (ref 14–54)
ANION GAP: 7 (ref 5–15)
AST: 18 U/L (ref 15–41)
Alkaline Phosphatase: 67 U/L (ref 38–126)
BILIRUBIN TOTAL: 0.8 mg/dL (ref 0.3–1.2)
BUN: 22 mg/dL — ABNORMAL HIGH (ref 6–20)
CHLORIDE: 112 mmol/L — AB (ref 101–111)
CO2: 21 mmol/L — ABNORMAL LOW (ref 22–32)
Calcium: 9.4 mg/dL (ref 8.9–10.3)
Creatinine, Ser: 1.25 mg/dL — ABNORMAL HIGH (ref 0.44–1.00)
GFR calc Af Amer: 50 mL/min — ABNORMAL LOW (ref >60)
GFR calc non Af Amer: 43 mL/min — ABNORMAL LOW (ref >60)
GLUCOSE: 92 mg/dL (ref 65–99)
POTASSIUM: 4.1 mmol/L (ref 3.5–5.1)
SODIUM: 140 mmol/L (ref 135–145)
TOTAL PROTEIN: 7.8 g/dL (ref 6.5–8.1)

## 2015-07-23 LAB — SALICYLATE LEVEL: Salicylate Lvl: <4 mg/dL (ref 2.8–30.0)

## 2015-07-23 LAB — ETHANOL

## 2015-07-23 LAB — ACETAMINOPHEN LEVEL

## 2015-07-23 MED ORDER — LORAZEPAM 1 MG PO TABS
1.0000 mg | ORAL_TABLET | Freq: Once | ORAL | Status: AC
  Administered 2015-07-23: 1 mg via ORAL
  Filled 2015-07-23: qty 1
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
## 2015-07-23 NOTE — ED Provider Notes (Signed)
[name] Provider Note  ____________________________________________  Time seen: Approximately 9:18 AM  I have reviewed the triage vital signs and the nursing notes.   HISTORY  Chief Complaint Psychiatric Evaluation and Anxiety    HPI Deborah [name] is a 70 y.o. female with asthma, HTN, GERD, severe anxiety, depression, bipolar disorder who presents for evaluation of severe anxiety and mood lability over the past few days, constant since onset, currently severe, no modifying factors. Her daughter called EMS today because of the patient's strange behavior. The patient reports "I'm very anxious, I just need something for my nerves". No recent illness including no cough, sneezing, runny nose, congestion, vomiting, diarrhea, fevers or chills. No chest pain or difficulty breathing. She denies suicidal ideation, homicidal ideation or audiovisual hallucinations.   Past Medical History  Diagnosis Date  . Asthma   . Ulcer   . Heart murmur   . Cystitis   . Arthritis   . Depression   . Spastic colon   . IBS (irritable bowel syndrome)   . Osteoporosis   . Anxiety     Patient Active Problem List   Diagnosis Date Noted  . Anxiety as acute reaction to exceptional stress 07/23/2015  . Generalized anxiety disorder 07/23/2015  . Overdose 07/03/2015  . Mild dementia 05/29/2015  . 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 Depression, major, recurrent, moderate (HCC) 04/01/2015  . Bipolar 1 disorder, depressed, moderate (HCC)   . Clinical depression 12/24/2014  . H/O disease 09/08/2014  . History of migraine headaches 09/08/2014  . H/O Bell's palsy 09/08/2014  . H/O: HTN (hypertension) 09/08/2014  . Gastroesophageal reflux disease without esophagitis 09/08/2014  . CAFL (chronic airflow limitation) (HCC) 09/08/2014  . History of asthma 09/08/2014  . Breast neoplasm, Tis (LCIS) 01/23/2013     Past Surgical History  Procedure Laterality Date  . Cholecystectomy    . Spine surgery    . Colonoscopy  2002  . Upper gi endoscopy  2011  . Arm surgery Left 2011    fracture repair  . Throat biopsy   2012  . Shoulder surgery    . Elbow surgery    . Breast biopsy      LCIS    Current Outpatient Rx  Name  Route  Sig  Dispense  Refill  . donepezil (ARICEPT) 5 MG tablet   Oral   Take 5 mg by mouth daily.         Marland Kitchen escitalopram (LEXAPRO) 10 MG tablet   Oral   Take 10 mg by mouth daily.         Marland Kitchen LORazepam (ATIVAN) 0.5 MG tablet   Oral   Take 0.5 mg by mouth at bedtime.         Marland Kitchen QUEtiapine (SEROQUEL) 25 MG tablet   Oral   Take 1 tablet (25 mg total) by mouth 2 (two) times daily.   15 tablet   8     Please dispense only 1 week supply at a time.   . traZODone (DESYREL) 50 MG tablet   Oral   Take 1 tablet (50 mg total) by mouth at bedtime.   7 tablet   8     Please dispense 1 week supply at a time.     Allergies Prednisone  Family History  Problem Relation Age of Onset  . Cancer Sister     ovarian  . Cancer Maternal Aunt     breast  .  Cancer Maternal Aunt     breast  . Heart attack Mother     Social History Social History  Substance Use Topics  . Smoking status: Current Every Day Smoker -- 1.00 packs/day for 20 years    Types: Cigarettes    Start date: 03/31/1977  . Smokeless tobacco: Never Used  . Alcohol Use: No     Comment: Ocassional    Review of Systems Constitutional: No fever/chills Eyes: No visual changes. ENT: No sore throat. Cardiovascular: Denies chest pain. Respiratory: Denies shortness of breath. Gastrointestinal: No abdominal pain.  No nausea, no vomiting.  No diarrhea.  No constipation. Genitourinary: Negative for dysuria. Musculoskeletal: Negative for back pain. Skin: Negative for rash. Neurological: Negative for headaches, focal weakness or numbness.  10-point ROS otherwise  negative.  ____________________________________________   PHYSICAL EXAM:  VITAL SIGNS: ED Triage Vitals  Enc Vitals Group     BP 07/23/15 0906 134/72 mmHg     Pulse Rate 07/23/15 0906 70     Resp 07/23/15 0906 20     Temp 07/23/15 0906 98.2 F (36.8 C)     Temp Source 07/23/15 0906 Oral     SpO2 07/23/15 0848 96 %     Weight 07/23/15 0906 123 lb (55.792 kg)     Height 07/23/15 0906  (1.6 m)     Head Cir --      Peak Flow --      Pain Score 07/23/15 0851 0     Pain Loc --      Pain Edu? --      Excl. in GC? --     Constitutional: Alert and oriented. Nontoxic-appearing, easily frightened/startled with labile mood. Eyes: Conjunctivae are normal. PERRL. EOMI. Head: Atraumatic. Nose: No congestion/rhinnorhea. Mouth/Throat: Mucous membranes are moist.  Oropharynx non-erythematous. Neck: No stridor.  Supple without meningismus. Cardiovascular: Normal rate, regular rhythm. Grossly normal heart sounds.  Good peripheral circulation. Respiratory: Normal respiratory effort.  No retractions. Lungs CTAB. Gastrointestinal: Soft and nontender. No distention.  No CVA tenderness. Genitourinary: deferred Musculoskeletal: No lower extremity tenderness nor edema.  No joint effusions. Neurologic:  Normal speech and language. No gross focal neurologic deficits are appreciated. No gait instability. Skin:  Skin is warm, dry and intact. No rash noted. Psychiatric: Mood is quite anxious and labile. She has notable psychomotor agitation, she is tremulous. Her affect is restricted.  ____________________________________________   LABS (all labs ordered are listed, but only abnormal results are displayed)  Labs Reviewed  COMPREHENSIVE METABOLIC PANEL - Abnormal; Notable for the following:    Chloride 112 (*)    CO2 21 (*)    BUN 22 (*)    Creatinine, Ser 1.25 (*)    ALT 13 (*)    GFR calc non Af Amer 43 (*)    GFR calc Af Amer 50 (*)    All other components within normal limits   ACETAMINOPHEN LEVEL - Abnormal; Notable for the following:    Acetaminophen (Tylenol), Serum <10 (*)    All other components within normal limits  CBC - Abnormal; Notable for the following:    RDW 15.3 (*)    All other components within normal limits  ETHANOL  SALICYLATE LEVEL   ____________________________________________  EKG  nonw ____________________________________________  RADIOLOGY  nonw ____________________________________________   PROCEDURES  Procedure(s) performed: None  Critical Care performed: No  ____________________________________________   INITIAL IMPRESSION / ASSESSMENT AND PLAN / ED COURSE  Pertinent labs & imaging results that were available during my  care of the patient were reviewed by me and considered in my medical decision making (see chart for details).  Matthias HughsBetty S Schellenberg is a 70 y.o. female with asthma, HTN, GERD, severe anxiety, depression, bipolar disorder who presents for evaluation of severe anxiety and mood lability over the past few days. On exam, she does appear very anxious, she is tremulous, her affect is labile, she has psychomotor agitation. Concerned that she may have a flare of her bipolar disorder with some degree of mania. Vital signs stable, she is afebrile. She has no acute medical complaints. We'll obtain screening labs, consult TTS, psychiatry, place involuntary commitment.  ----------------------------------------- 1:27 PM on 07/23/2015 ----------------------------------------- Labs reviewed and are notable for mild chronic creatinine elevation at 1.25, encourage by mouth fluids. The remainder of her labs are unremarkable. Dr. Toni Amendlapacs has evaluated the patient, lifted IVC, recommends discharge with follow-up with her outpatient psychiatrist. The patient is comfortable with discharge plan. DC with return precautions.  ____________________________________________   FINAL CLINICAL IMPRESSION(S) / ED DIAGNOSES  Final  diagnoses:  Bipolar 1 disorder (HCC)      Gayla DossEryka A Shaymus Eveleth, MD 07/23/15 [phone]

## 2015-07-23 NOTE — ED Notes (Signed)
Pt had 15 minute visit with her daughter.

## 2015-07-23 NOTE — ED Notes (Signed)
NAD noted at time of D/C. Pt denies questions or concerns. Pt ambulatory to the lobby at this time. Skin noted to be warm, dry, intact. Respirations noted to be even and unlabored at this time. Instructed patient to return to ER for worsening or life-threatening symptoms.

## 2015-07-23 NOTE — ED Notes (Signed)
Pt requests phone to call husband. With patient's permission this RN contacted Irena CordsDan Soloman (husband regarding pt D/C). Pt's husband states that he will come pick her up for D/C.

## 2015-07-23 NOTE — BH Assessment (Signed)
Writer called and left a HIPPA Compliant message with Daughter Nita Sells(Maryann Tucker-[phone]), requesting a return phone call.

## 2015-07-23 NOTE — ED Notes (Signed)
Per EMS, they were called out for a "sick person" by patient's daughter. Pt states she doesn't know why her daughter called EMS. EMS states that patient was disorganized and labile on the way to the hospital. Per EMS pt told them she "needs meds for nerves". Pt noted to be withdrawn during triage sitting on bed with arms wrapped around herself, no eye contact with this RN. Pt states to this RN, "I guess my nerves are just shot".

## 2015-07-23 NOTE — ED Notes (Signed)
Pt resting in bed at this time. Pt appears to be more calm at this time. Pt remains in bed at this time no acute distress noted at this time. Will continue to monitor with Q15 minute safety checks at this time.

## 2015-07-23 NOTE — ED Notes (Addendum)
Pt noted to be agitated at this time. Pt states " I want to go home, call my husband so I can go home". This RN and Crystal, EDT explained to patient that she could not go home until seen by psychiatry. Pt states "I don't need to be here, I want to go home". Pt able to be redirected back to bed, lights dimmed, channel changed on TV as well as patient given milk per her request at this time.

## 2015-07-23 NOTE — Consult Note (Signed)
[facility] Consult   Reason for Consult:  Consult for 71 year old woman with past history of anxiety disorder who presented to the emergency room this morning because of acute anxiety and panicky symptoms. Referring Physician:  Edd Fabian Patient Identification: [name] MRN:  [id number] Principal Diagnosis: Anxiety as acute reaction to exceptional stress Diagnosis:   Patient Active Problem List   Diagnosis Date Noted  . Anxiety as acute reaction to exceptional stress [F41.9] 07/23/2015  . Generalized anxiety disorder [F41.1] 07/23/2015  . Overdose [T50.901A] 07/03/2015  . Mild dementia [F03.90] 05/29/2015  . 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 Depression, major, recurrent, moderate ([facility]) [F33.1] 04/01/2015  . Bipolar 1 disorder, depressed, moderate ([place]) [F31.32]   . Clinical depression [F32.9] 12/24/2014  . H/O disease [Z87.898] 09/08/2014  . History of migraine headaches [Z86.69] 09/08/2014  . H/O Bell's palsy [Z86.69] 09/08/2014  . H/O: HTN (hypertension) [Z86.79] 09/08/2014  . Gastroesophageal reflux disease without esophagitis [K21.9] 09/08/2014  . CAFL (chronic airflow limitation) ([facility]) [J44.9] 09/08/2014  . History of asthma [Z87.09] 09/08/2014  . Breast neoplasm, Tis (LCIS) [D05.00] 01/23/2013    Total Time spent with patient: 1 hour  Subjective:   Deborah [name] is a 70 y.o. female patient admitted with "I just got so nervous". i just got so nervous"HPI:  Patient interviewed. Chart reviewed. Old notes reviewed. Labs and vitals reviewed. 70 year old woman came voluntarily to the hospital today because of acute anxiety. She tells me that yesterday she learned that her ex-husband's cancer had returned. She emphasizes several times that even though she and her ex-husband have not been together for almost 30 years that she still cares for him and it is upsetting to her that his cancer has  returned. She didn't sleep very well last night. This morning she was feeling panicky and anxious. Her daughter came over to see her which is normal and found her in what sounds like a pretty anxious state and the patient asked to be brought to the hospital so she could get "nerve pills". Patient states that she has chronic trouble sleeping particularly since her last hospitalization. Her mood however she says is not depressed and although she always has some nervousness has not been particularly upset or agitated prior to today. She denies having any auditory or visual hallucinations. She denies any suicidal or homicidal ideation and there is no mention of any suicidal statements. Patient says she is compliant with the little medicine that she has but she claims that the only psychiatric medicine she currently is prescribed his trazodone. She just got out of another geriatric psychiatry ward a few weeks ago. This is the biggest acute stress that she reports. Denies any new medical problems. Denies that she's been drinking or abusing drugs.  Social history: Patient lives with her current husband. He works during the day. Patient spends most of her time puttering around the house. She has twin daughters one of whom comes by and visits her pretty much every day. Patient has minimal social contact outside her immediate family.  Medical history: Patient has a past history of breast cancer history of chronic migraines history of high blood pressure and COPD.  Substance abuse history: Denies ever having a history of alcohol abuse. Patient denies any other substance abuse. I think the circumstantial evidence suggests that she may have at times overused her benzodiazepines in the past but she did not see it that way.  Past Psychiatric History:  Patient says she has overdosed a few times in the past but never with any intent to harm or kill herself. Denies ever having had past suicidal ideation. She has had past  psychiatric hospitalizations and in fact was sent to a geriatric ward from our emergency room last month because of agitation and anxiety. At that time her anxiety was so bad that she was becoming disruptive with her outpatient psychiatrist with the frequency that she was calling and asking for nerve pills. Patient reports that she is still seeing doctor Gretel Acre although she is also going to see a primary care doctor.  Risk to Self: Suicidal Ideation: No Suicidal Intent: No Is patient at risk for suicide?: No Suicidal Plan?: No Access to Means: No What has been your use of drugs/alcohol within the last 12 months?: None Reported How many times?: 1 Other Self Harm Risks: None Reported Triggers for Past Attempts: Spouse contact, Other (Comment) (Anixety) Intentional Self Injurious Behavior: None Risk to Others: Homicidal Ideation: No Thoughts of Harm to Others: No Current Homicidal Intent: No Current Homicidal Plan: No Access to Homicidal Means: No Identified Victim: None Report History of harm to others?: No Assessment of Violence: None Noted Violent Behavior Description: None Reported Does patient have access to weapons?: No Criminal Charges Pending?: No Does patient have a court date: No Prior Inpatient Therapy: Prior Inpatient Therapy: Yes Prior Therapy Dates: 08/2006 & 06/2015 Prior Therapy Facilty/Provider(s): [facility] Reason for Treatment: Major Depression Prior Outpatient Therapy: Prior Outpatient Therapy: Yes Prior Therapy Dates: Currently Prior Therapy Facilty/Provider(s): [facility] Reason for Treatment: Major Depression Does patient have an ACCT team?: No Does patient have Intensive In-House Services?  : No Does patient have [facility] services? : No Does patient have P4CC services?: No  Past Medical History:  Past Medical History  Diagnosis Date  . Asthma   . Ulcer   . Heart murmur   . Cystitis   . Arthritis   . Depression   .  Spastic colon   . IBS (irritable bowel syndrome)   . Osteoporosis   . Anxiety     Past Surgical History  Procedure Laterality Date  . Cholecystectomy    . Spine surgery    . Colonoscopy  2002  . Upper gi endoscopy  2011  . Arm surgery Left 2011    fracture repair  . Throat biopsy   2012  . Shoulder surgery    . Elbow surgery    . Breast biopsy      LCIS   Family History:  Family History  Problem Relation Age of Onset  . Cancer Sister     ovarian  . Cancer Maternal Aunt     breast  . Cancer Maternal Aunt     breast  . Heart attack Mother    Family Psychiatric  History: There is a vague positive family history of anxiety disorder nothing else reported Social History:  History  Alcohol Use No    Comment: Ocassional     History  Drug Use No    Social History   Social History  . Marital Status: Married    Spouse Name: N/A  . Number of Children: N/A  . Years of Education: N/A   Social History Main Topics  . Smoking status: Current Every Day Smoker -- 1.00 packs/day for 20 years    Types: Cigarettes    Start date: 03/31/1977  . Smokeless tobacco: Never Used  . Alcohol Use: No  Comment: Ocassional  . Drug Use: No  . Sexual Activity: Not Asked   Other Topics Concern  . None   Social History Narrative   Additional Social History:    Allergies:   Allergies  Allergen Reactions  . Prednisone Nausea And Vomiting    Labs:  Results for orders placed or performed during the hospital encounter of 07/23/15 (from the past 48 hour(s))  Comprehensive metabolic panel     Status: Abnormal   Collection Time: 07/23/15  9:00 AM  Result Value Ref Range   Sodium 140 135 - 145 mmol/L   Potassium 4.1 3.5 - 5.1 mmol/L   Chloride 112 (H) 101 - 111 mmol/L   CO2 21 (L) 22 - 32 mmol/L   Glucose, Bld 92 65 - 99 mg/dL   BUN 22 (H) 6 - 20 mg/dL   Creatinine, Ser 1.25 (H) 0.44 - 1.00 mg/dL   Calcium 9.4 8.9 - 10.3 mg/dL   Total Protein 7.8 6.5 - 8.1 g/dL   Albumin 4.4  3.5 - 5.0 g/dL   AST 18 15 - 41 U/L   ALT 13 (L) 14 - 54 U/L   Alkaline Phosphatase 67 38 - 126 U/L   Total Bilirubin 0.8 0.3 - 1.2 mg/dL   GFR calc non Af Amer 43 (L) >60 mL/min   GFR calc Af Amer 50 (L) >60 mL/min    Comment: (NOTE) The eGFR has been calculated using the CKD EPI equation. This calculation has not been validated in all clinical situations. eGFR's persistently <60 mL/min signify possible Chronic Kidney Disease.    Anion gap 7 5 - 15  Ethanol (ETOH)     Status: None   Collection Time: 07/23/15  9:00 AM  Result Value Ref Range   Alcohol, Ethyl (B) <5 <5 mg/dL    Comment:        LOWEST DETECTABLE LIMIT FOR SERUM ALCOHOL IS 5 mg/dL FOR MEDICAL PURPOSES ONLY   Salicylate level     Status: None   Collection Time: 07/23/15  9:00 AM  Result Value Ref Range   Salicylate Lvl <0.6 2.8 - 30.0 mg/dL  Acetaminophen level     Status: Abnormal   Collection Time: 07/23/15  9:00 AM  Result Value Ref Range   Acetaminophen (Tylenol), Serum <10 (L) 10 - 30 ug/mL    Comment:        THERAPEUTIC CONCENTRATIONS VARY SIGNIFICANTLY. A RANGE OF 10-30 ug/mL MAY BE AN EFFECTIVE CONCENTRATION FOR MANY PATIENTS. HOWEVER, SOME ARE BEST TREATED AT CONCENTRATIONS OUTSIDE THIS RANGE. ACETAMINOPHEN CONCENTRATIONS >150 ug/mL AT 4 HOURS AFTER INGESTION AND >50 ug/mL AT 12 HOURS AFTER INGESTION ARE OFTEN ASSOCIATED WITH TOXIC REACTIONS.   CBC     Status: Abnormal   Collection Time: 07/23/15  9:00 AM  Result Value Ref Range   WBC 9.6 3.6 - 11.0 K/uL   RBC 4.43 3.80 - 5.20 MIL/uL   Hemoglobin 14.3 12.0 - 16.0 g/dL   HCT 42.2 35.0 - 47.0 %   MCV 95.3 80.0 - 100.0 fL   MCH 32.3 26.0 - 34.0 pg   MCHC 33.9 32.0 - 36.0 g/dL   RDW 15.3 (H) 11.5 - 14.5 %   Platelets 203 150 - 440 K/uL    No current facility-administered medications for this encounter.   Current Outpatient Prescriptions  Medication Sig Dispense Refill  . donepezil (ARICEPT) 5 MG tablet Take 5 mg by mouth daily.    Marland Kitchen  escitalopram (LEXAPRO) 10 MG tablet Take 10 mg  by mouth daily.    Marland Kitchen LORazepam (ATIVAN) 0.5 MG tablet Take 0.5 mg by mouth at bedtime.    Marland Kitchen QUEtiapine (SEROQUEL) 25 MG tablet Take 1 tablet (25 mg total) by mouth 2 (two) times daily. 15 tablet 8  . traZODone (DESYREL) 50 MG tablet Take 1 tablet (50 mg total) by mouth at bedtime. 7 tablet 8    Musculoskeletal: Strength & Muscle Tone: decreased Gait & Station: normal Patient leans: N/A  Psychiatric Specialty Exam: Review of Systems  Constitutional: Negative.   HENT: Negative.   Eyes: Negative.   Respiratory: Negative.   Cardiovascular: Negative.   Gastrointestinal: Negative.   Musculoskeletal: Negative.   Skin: Negative.   Neurological: Negative.   Psychiatric/Behavioral: Negative for depression, suicidal ideas, hallucinations, memory loss and substance abuse. The patient is nervous/anxious and has insomnia.     Blood pressure 134/72, pulse 70, temperature 98.2 F (36.8 C), temperature source Oral, resp. rate 20, height 5' 3" (1.6 m), weight 55.792 kg (123 lb), SpO2 96 %.Body mass index is 21.79 kg/(m^2).  General Appearance: Disheveled  Eye Contact::  Good  Speech:  Normal Rate  Volume:  Normal  Mood:  Anxious  Affect:  Appropriate  Thought Process:  Coherent  Orientation:  Full (Time, Place, and Person)  Thought Content:  Negative  Suicidal Thoughts:  No  Homicidal Thoughts:  No  Memory:  Immediate;   Good Recent;   Fair Remote;   Fair  Judgement:  Fair  Insight:  Fair  Psychomotor Activity:  Normal  Concentration:  Poor  Recall:  [employer] of Knowledge:Fair  Language: Fair  Akathisia:  No  Handed:  Right  AIMS (if indicated):     Assets:  Catering manager Housing Physical Health Resilience Social Support  ADL's:  Intact  Cognition: Impaired,  Mild  Sleep:      Treatment Plan Summary: Medication management and Plan This is a 70 year old woman with chronic anxiety problems and probably a mild  degree of dementia. She tends to repeat herself over and over during the interview and doesn't seem to be aware of it although she does retain the ability to remember 2 out of 3 words at 3 minutes and give a coherent history. Currently she is completely without any sign of dangerousness. She is not psychotic. She is back to her baseline. I am giving her a diagnosis primarily of acute anxiety and I don't think she needs hospital level treatment. Discontinued IVC. Case reviewed with Dr. Baker Janus. Patient can be released at the discretion of emergency room doctor. She will follow-up with her outpatient psychiatrist and primary care doctor. I would not recommend giving her any antianxiety medication prescriptions to take with her at discharge.  Disposition: Patient does not meet criteria for psychiatric inpatient admission. Supportive therapy provided about ongoing stressors.  Alethia Berthold, MD 07/23/2015 12:16 PM

## 2015-07-23 NOTE — ED Notes (Signed)
Pt requests phone to call her husband, multiple attempts made with no answer, pt then requests to speak with her daughter. Carolann contacted from info on patient demographics.

## 2015-07-24 ENCOUNTER — Ambulatory Visit: Payer: Commercial Managed Care - HMO | Admitting: Psychiatry

## 2015-07-24 NOTE — Telephone Encounter (Signed)
per dr. Garnetta Buddyfaheem she does not feel comfortable writing such a letter pt will need to see new doctor in [place] for that info

## 2015-07-24 NOTE — Telephone Encounter (Signed)
called , she needs letter stating if dr. Garnetta Buddyfaheem feel like patient is safe in her mental states or does she need to be placed in a home facility.

## 2015-07-29 DIAGNOSIS — F319 Bipolar disorder, unspecified: Secondary | ICD-10-CM | POA: Diagnosis not present

## 2015-07-29 DIAGNOSIS — F332 Major depressive disorder, recurrent severe without psychotic features: Secondary | ICD-10-CM | POA: Diagnosis not present

## 2015-07-30 ENCOUNTER — Other Ambulatory Visit: Payer: Self-pay | Admitting: Psychiatry

## 2015-08-04 ENCOUNTER — Encounter: Payer: Self-pay | Admitting: Emergency Medicine

## 2015-08-04 ENCOUNTER — Ambulatory Visit
Admission: EM | Admit: 2015-08-04 | Discharge: 2015-08-04 | Disposition: A | Discharge status: Home / Self Care | Payer: Commercial Managed Care - HMO | Attending: Emergency Medicine | Admitting: Emergency Medicine

## 2015-08-04 ENCOUNTER — Ambulatory Visit (INDEPENDENT_AMBULATORY_CARE_PROVIDER_SITE_OTHER): Payer: Commercial Managed Care - HMO

## 2015-08-04 ENCOUNTER — Ambulatory Visit: Payer: Commercial Managed Care - HMO

## 2015-08-04 DIAGNOSIS — K2970 Gastritis, unspecified, without bleeding: Secondary | ICD-10-CM

## 2015-08-04 DIAGNOSIS — F419 Anxiety disorder, unspecified: Secondary | ICD-10-CM

## 2015-08-04 DIAGNOSIS — R1013 Epigastric pain: Secondary | ICD-10-CM | POA: Diagnosis not present

## 2015-08-04 LAB — CBC WITH DIFFERENTIAL/PLATELET
Basophils Absolute: 0 10*3/uL (ref 0–0.1)
Basophils Relative: 1 %
Eosinophils Absolute: 0.1 10*3/uL (ref 0–0.7)
Eosinophils Relative: 3 %
HEMATOCRIT: 42 % (ref 35.0–47.0)
HEMOGLOBIN: 13.8 g/dL (ref 12.0–16.0)
LYMPHS ABS: 1.7 10*3/uL (ref 1.0–3.6)
LYMPHS PCT: 52 %
MCH: 32 pg (ref 26.0–34.0)
MCHC: 32.9 g/dL (ref 32.0–36.0)
MCV: 97.2 fL (ref 80.0–100.0)
MONOS PCT: 14 %
Monocytes Absolute: 0.4 10*3/uL (ref 0.2–0.9)
NEUTROS ABS: 1 10*3/uL — AB (ref 1.4–6.5)
NEUTROS PCT: 30 %
Platelets: 154 10*3/uL (ref 150–440)
RBC: 4.32 MIL/uL (ref 3.80–5.20)
RDW: 15.5 % — ABNORMAL HIGH (ref 11.5–14.5)
WBC: 3.2 10*3/uL — ABNORMAL LOW (ref 3.6–11.0)

## 2015-08-04 MED ORDER — GI COCKTAIL ~~LOC~~
30.0000 mL | Freq: Once | ORAL | Status: AC
  Administered 2015-08-04: 30 mL via ORAL

## 2015-08-04 MED ORDER — ONDANSETRON HCL 4 MG PO TABS: 4.0000 mg | ORAL_TABLET | Freq: Four times a day (QID) | ORAL | Status: DC

## 2015-08-04 MED ORDER — HYDROXYZINE HCL 50 MG PO TABS: 50.0000 mg | ORAL_TABLET | Freq: Three times a day (TID) | ORAL | Status: DC | PRN

## 2015-08-04 MED ORDER — ONDANSETRON 8 MG PO TBDP
8.0000 mg | ORAL_TABLET | Freq: Once | ORAL | Status: AC
  Administered 2015-08-04: 8 mg via ORAL

## 2015-08-04 NOTE — ED Provider Notes (Signed)
HPI  SUBJECTIVE:  Deborah [name] is a 70 y.o. female who presents with months long throbbing, nonradiating gastric pain that she describes as intermittent, lasting hours. She reports nausea starting yesterday. She states pain does not radiate to her neck, through to back, up into her chest or down her arm. She denies vomiting, fevers. No abdominal distention, urinary complaints. Reports some chest pain, short of breath, but this is been present for months, and is unchanged today. There is no exertional component to her chest pain. Had a normal bowel movement this morning. No syncope. The abdominal pain is not affected with fasting, eating, movement. States that the car ride over here was not painful. Patient states that her symptoms have been better with "nerve pills" in the past, worse with stress. She has not tried anything for her nausea or abdominal pain. States that she has had an increased amount of stress. She gives a complex story of how she currently does not have a psychiatrist, but has follow-up with a psychiatrist in [address] on April 24. She states that she did not get any lorazepam when she picked up her medications 3 days ago. She is requesting a "shot for her nerves". Past medical history of bipolar, anxiety, GERD, syncope, gastritis. Chart review shows history of ulcer, IBS, asthma. She is a smoker. She denies NSAID or Tylenol use,  ETOH use. Past medical history negative for MI, coronary disease, arrhythmia, pancreatitis, gallbladder disease. Denies suicidal, homicidal ideation, auditory or visual hallucinations. She is on Lexapro, Seroquel, trazodone for insomnia.  Past Medical History  Diagnosis Date  . Asthma   . Ulcer   . Heart murmur   . Cystitis   . Arthritis   . Depression   . Spastic colon   . IBS (irritable bowel syndrome)   . Osteoporosis   . Anxiety     Past Surgical History  Procedure Laterality Date  . Cholecystectomy    . Spine surgery    . Colonoscopy  2002  .  Upper gi endoscopy  2011  . Arm surgery Left 2011    fracture repair  . Throat biopsy   2012  . Shoulder surgery    . Elbow surgery    . Breast biopsy      LCIS    Family History  Problem Relation Age of Onset  . Cancer Sister     ovarian  . Cancer Maternal Aunt     breast  . Cancer Maternal Aunt     breast  . Heart attack Mother     Social History  Substance Use Topics  . Smoking status: Current Every Day Smoker -- 1.00 packs/day for 20 years    Types: Cigarettes    Start date: 03/31/1977  . Smokeless tobacco: Never Used  . Alcohol Use: No     Comment: Ocassional    No current facility-administered medications for this encounter.  Current outpatient prescriptions:  .  donepezil (ARICEPT) 5 MG tablet, Take 5 mg by mouth daily., Disp: , Rfl:  .  escitalopram (LEXAPRO) 10 MG tablet, Take 10 mg by mouth daily., Disp: , Rfl:  .  QUEtiapine (SEROQUEL) 25 MG tablet, Take 1 tablet (25 mg total) by mouth 2 (two) times daily., Disp: 15 tablet, Rfl: 8 .  traZODone (DESYREL) 50 MG tablet, Take 1 tablet (50 mg total) by mouth at bedtime., Disp: 7 tablet, Rfl: 8 .  hydrOXYzine (ATARAX/VISTARIL) 50 MG tablet, Take 1 tablet (50 mg total) by mouth 3 (three)  times daily as needed for anxiety., Disp: 30 tablet, Rfl: 0 .  LORazepam (ATIVAN) 0.5 MG tablet, Take 0.5 mg by mouth at bedtime., Disp: , Rfl:  .  ondansetron (ZOFRAN) 4 MG tablet, Take 1 tablet (4 mg total) by mouth every 6 (six) hours., Disp: 20 tablet, Rfl: 0  Allergies  Allergen Reactions  . Prednisone Nausea And Vomiting     ROS  As noted in HPI.   Physical Exam  BP 116/72 mmHg  Temp(Src) 97.6 F (36.4 C) (Tympanic)  Resp 20  Ht  (1.626 m)  Wt 120 lb (54.432 kg)  BMI 20.59 kg/m2  SpO2 98%  Constitutional: Well developed, well nourished Eyes: PERRL, EOMI, conjunctiva normal bilaterally HENT: Normocephalic, atraumatic,mucus membranes moist Respiratory: Clear to auscultation bilaterally, no rales, no  wheezing, no rhonchi Cardiovascular: Normal rate and rhythm, no murmurs, no gallops, no rubs GI: Soft, nondistended, normal bowel sounds, subxiphoid/gastric tenderness no rebound, no guarding Back: no CVAT skin: No rash, skin intact Musculoskeletal: No edema, no tenderness, no deformities Neurologic: Alert & oriented x 3, CN II-XII grossly intact, no motor deficits, sensation grossly intact Psychiatric: Appears anxious, tremulous. Startles easily. No apparent auditory or visual hallucinations. Speech normal.   ED Course   Medications  gi cocktail (Maalox,Lidocaine,Donnatal) (30 mLs Oral Given 08/04/15 0920)  ondansetron (ZOFRAN-ODT) disintegrating tablet 8 mg (8 mg Oral Given 08/04/15 0919)    Orders Placed This Encounter  Procedures  . DG Abd 1 View    Standing Status: Standing     Number of Occurrences: 1     Standing Expiration Date:     Order Specific Question:  Reason for Exam (SYMPTOM  OR DIAGNOSIS REQUIRED)    Answer:  r/o free air  . CBC with Differential    Standing Status: Standing     Number of Occurrences: 1     Standing Expiration Date:    Results for orders placed or performed during the hospital encounter of 08/04/15 (from the past 24 hour(s))  CBC with Differential     Status: Abnormal   Collection Time: 08/04/15  9:31 AM  Result Value Ref Range   WBC 3.2 (L) 3.6 - 11.0 K/uL   RBC 4.32 3.80 - 5.20 MIL/uL   Hemoglobin 13.8 12.0 - 16.0 g/dL   HCT 65.7 84.6 - 96.2 %   MCV 97.2 80.0 - 100.0 fL   MCH 32.0 26.0 - 34.0 pg   MCHC 32.9 32.0 - 36.0 g/dL   RDW 95.2 (H) 84.1 - 32.4 %   Platelets 154 150 - 440 K/uL   Neutrophils Relative % 30 %   Neutro Abs 1.0 (L) 1.4 - 6.5 K/uL   Lymphocytes Relative 52 %   Lymphs Abs 1.7 1.0 - 3.6 K/uL   Monocytes Relative 14 %   Monocytes Absolute 0.4 0.2 - 0.9 K/uL   Eosinophils Relative 3 %   Eosinophils Absolute 0.1 0 - 0.7 K/uL   Basophils Relative 1 %   Basophils Absolute 0.0 0 - 0.1 K/uL   Dg Abd 1 View  08/04/2015   CLINICAL DATA:  70 year old female with epigastric pain since last night and nausea. Initial encounter. Query pneumoperitoneum. EXAM: ABDOMEN - 1 VIEW COMPARISON:  CT Abdomen and Pelvis 10/09/2008. FINDINGS: It is unclear whether either of these views is performed upright which would maximize sensitivity for free air. The lung bases are partially visible. No pneumoperitoneum identified. Non obstructed bowel gas pattern. Stable cholecystectomy clips. Stable epigastric surgical clips seen to be  about the stomach on the prior CT. Osteopenia. Chronic left lower lumbosacral fusion hardware. No acute osseous abnormality identified. No definite urologic calculus. IMPRESSION: Normal bowel gas pattern, no free air identified. Electronically Signed   By: Odessa FlemingH  Hall M.D.   On: 08/04/2015 09:57    ED Clinical Impression  Gastritis  Anxiety   ED Assessment/Plan  Records reviewed. Patient was seen in the ED on 3/8 for "nerves"/severe anxiety. She was evaluated by psych, was thought to be stable for outpatient treatment. It was recommended that she not receive any antianxiety medication prescriptions. She has had several ER evaluations for this over the past several months. Also had an ER evaluation 3/1 for chest pain, which was found to be negative.  [facility]. Patient picked up lorazepam 0.5 mg # 7 on 08/01/2015. She has 5 refills. This is prescribed to her by Dr. Garnetta BuddyFaheem, who has been prescribing this for at least the past 6 months.   Abdominal pain appears to be GI in origin. Doubt cardiac cause of her symptoms, Given the recent negative cardiac workup and no change in her chest pain. We will check upright axr  for free air, CBC given tenderness in the epigastric region to rule out perforation. GI cocktail with viscous lidocaine, Benadryl, Maalox. Also Zofran Doubt cardiac cause of her abdominal pain. We will send patient home with Vistaril.   Reviewed labs, imaging independently.  No free air on x-ray. See radiology report for details.   Slight leukopenia, CBC otherwise normal.  On reevaluation, patient states that she feels better. Appears less anxious.  Discussed labs, imaging, MDM, plan and followup with patient and family. Discussed sn/sx that should prompt return to the ED. Patient  agrees with plan.   *This clinic note was created using Dragon dictation software. Therefore, there may be occasional mistakes despite careful proofreading.  ?  Domenick GongAshley Tesla Bochicchio, MD 08/04/15 [phone]

## 2015-08-04 NOTE — ED Notes (Signed)
Patient states she became nauseated yesterday but no vomiting and states her "nerves are shot"

## 2015-08-04 NOTE — Discharge Instructions (Signed)
Try the Vistaril. Its may make you sleepy in conjunction with your other medications, so be careful with it. It is very useful for anxiety. Start some Pepcid to help calm your stomach. Zofran as needed for nausea. Go to the ER for fevers, change in your abdominal pain, or other concerns. Follow up with your psychiatrist regarding your benzodiazepine prescription. You will need to follow up with your primary care physician as well. Continue all of your medications.

## 2015-08-07 DIAGNOSIS — F331 Major depressive disorder, recurrent, moderate: Secondary | ICD-10-CM | POA: Diagnosis not present

## 2015-08-07 DIAGNOSIS — F0390 Unspecified dementia without behavioral disturbance: Secondary | ICD-10-CM | POA: Diagnosis not present

## 2015-08-08 ENCOUNTER — Emergency Department
Admission: EM | Admit: 2015-08-08 | Discharge: 2015-08-08 | Disposition: A | Discharge status: Home / Self Care | Payer: Commercial Managed Care - HMO | Attending: Emergency Medicine | Admitting: Emergency Medicine

## 2015-08-08 ENCOUNTER — Emergency Department: Payer: Commercial Managed Care - HMO

## 2015-08-08 ENCOUNTER — Encounter: Payer: Self-pay | Admitting: Emergency Medicine

## 2015-08-08 DIAGNOSIS — Z79899 Other long term (current) drug therapy: Secondary | ICD-10-CM | POA: Diagnosis not present

## 2015-08-08 DIAGNOSIS — I10 Essential (primary) hypertension: Secondary | ICD-10-CM | POA: Diagnosis not present

## 2015-08-08 DIAGNOSIS — Y998 Other external cause status: Secondary | ICD-10-CM | POA: Diagnosis not present

## 2015-08-08 DIAGNOSIS — R05 Cough: Secondary | ICD-10-CM | POA: Diagnosis not present

## 2015-08-08 DIAGNOSIS — S0990XA Unspecified injury of head, initial encounter: Secondary | ICD-10-CM | POA: Diagnosis not present

## 2015-08-08 DIAGNOSIS — Y9289 Other specified places as the place of occurrence of the external cause: Secondary | ICD-10-CM | POA: Diagnosis not present

## 2015-08-08 DIAGNOSIS — S199XXA Unspecified injury of neck, initial encounter: Secondary | ICD-10-CM | POA: Insufficient documentation

## 2015-08-08 DIAGNOSIS — J45909 Unspecified asthma, uncomplicated: Secondary | ICD-10-CM | POA: Diagnosis not present

## 2015-08-08 DIAGNOSIS — Y9389 Activity, other specified: Secondary | ICD-10-CM | POA: Insufficient documentation

## 2015-08-08 DIAGNOSIS — F17210 Nicotine dependence, cigarettes, uncomplicated: Secondary | ICD-10-CM | POA: Insufficient documentation

## 2015-08-08 DIAGNOSIS — S59911A Unspecified injury of right forearm, initial encounter: Secondary | ICD-10-CM | POA: Diagnosis present

## 2015-08-08 DIAGNOSIS — M542 Cervicalgia: Secondary | ICD-10-CM

## 2015-08-08 DIAGNOSIS — S50811A Abrasion of right forearm, initial encounter: Secondary | ICD-10-CM | POA: Insufficient documentation

## 2015-08-08 DIAGNOSIS — W06XXXA Fall from bed, initial encounter: Secondary | ICD-10-CM | POA: Insufficient documentation

## 2015-08-08 MED ORDER — CYCLOBENZAPRINE HCL 10 MG PO TABS
10.0000 mg | ORAL_TABLET | Freq: Once | ORAL | Status: AC
  Administered 2015-08-08: 10 mg via ORAL
  Filled 2015-08-08: qty 1

## 2015-08-08 MED ORDER — AZITHROMYCIN 500 MG PO TABS: 500.0000 mg | ORAL_TABLET | Freq: Every day | ORAL | Status: AC

## 2015-08-08 MED ORDER — CYCLOBENZAPRINE HCL 10 MG PO TABS: 10.0000 mg | ORAL_TABLET | Freq: Three times a day (TID) | ORAL | Status: DC | PRN

## 2015-08-08 NOTE — Discharge Instructions (Signed)
Abrasion An abrasion is a cut or scrape on the outer surface of your skin. An abrasion does not extend through all of the layers of your skin. It is important to care for your abrasion properly to prevent infection. CAUSES Most abrasions are caused by falling on or gliding across the ground or another surface. When your skin rubs on something, the outer and inner layer of skin rubs off.  SYMPTOMS A cut or scrape is the main symptom of this condition. The scrape may be bleeding, or it may appear red or pink. If there was an associated fall, there may be an underlying bruise. DIAGNOSIS An abrasion is diagnosed with a physical exam. TREATMENT Treatment for this condition depends on how large and deep the abrasion is. Usually, your abrasion will be cleaned with water and mild soap. This removes any dirt or debris that may be stuck. An antibiotic ointment may be applied to the abrasion to help prevent infection. A bandage (dressing) may be placed on the abrasion to keep it clean. You may also need a tetanus shot. HOME CARE INSTRUCTIONS Medicines  Take or apply medicines only as directed by your health care provider.  If you were prescribed an antibiotic ointment, finish all of it even if you start to feel better. Wound Care  Clean the wound with mild soap and water 2-3 times per day or as directed by your health care provider. Pat your wound dry with a clean towel. Do not rub it.  There are many different ways to close and cover a wound. Follow instructions from your health care provider about:  Wound care.  Dressing changes and removal.  Check your wound every day for signs of infection. Watch for:  Redness, swelling, or pain.  Fluid, blood, or pus. General Instructions  Keep the dressing dry as directed by your health care provider. Do not take baths, swim, use a hot tub, or do anything that would put your wound underwater until your health care provider approves.  If there is  swelling, raise (elevate) the injured area above the level of your heart while you are sitting or lying down.  Keep all follow-up visits as directed by your health care provider. This is important. SEEK MEDICAL CARE IF:  You received a tetanus shot and you have swelling, severe pain, redness, or bleeding at the injection site.  Your pain is not controlled with medicine.  You have increased redness, swelling, or pain at the site of your wound. SEEK IMMEDIATE MEDICAL CARE IF:  You have a red streak going away from your wound.  You have a fever.  You have fluid, blood, or pus coming from your wound.  You notice a bad smell coming from your wound or your dressing.   This information is not intended to replace advice given to you by your health care provider. Make sure you discuss any questions you have with your health care provider.   Document Released: 02/10/2005 Document Revised: 01/22/2015 Document Reviewed: 05/01/2014 Elsevier Interactive Patient Education 2016 [employer]  Musculoskeletal Pain Musculoskeletal pain is muscle and boney aches and pains. These pains can occur in any part of the body. Your caregiver may treat you without knowing the cause of the pain. They may treat you if blood or urine tests, X-rays, and other tests were normal.  CAUSES There is often not a definite cause or reason for these pains. These pains may be caused by a type of germ (virus). The discomfort may also  come from overuse. Overuse includes working out too hard when your body is not fit. Boney aches also come from weather changes. Bone is sensitive to atmospheric pressure changes. HOME CARE INSTRUCTIONS   Ask when your test results will be ready. Make sure you get your test results.  Only take over-the-counter or prescription medicines for pain, discomfort, or fever as directed by your caregiver. If you were given medications for your condition, do not drive, operate machinery or power tools, or  sign legal documents for 24 hours. Do not drink alcohol. Do not take sleeping pills or other medications that may interfere with treatment.  Continue all activities unless the activities cause more pain. When the pain lessens, slowly resume normal activities. Gradually increase the intensity and duration of the activities or exercise.  During periods of severe pain, bed rest may be helpful. Lay or sit in any position that is comfortable.  Putting ice on the injured area.  Put ice in a bag.  Place a towel between your skin and the bag.  Leave the ice on for 15 to 20 minutes, 3 to 4 times a day.  Follow up with your caregiver for continued problems and no reason can be found for the pain. If the pain becomes worse or does not go away, it may be necessary to repeat tests or do additional testing. Your caregiver may need to look further for a possible cause. SEEK IMMEDIATE MEDICAL CARE IF:  You have pain that is getting worse and is not relieved by medications.  You develop chest pain that is associated with shortness or breath, sweating, feeling sick to your stomach (nauseous), or throw up (vomit).  Your pain becomes localized to the abdomen.  You develop any new symptoms that seem different or that concern you. MAKE SURE YOU:   Understand these instructions.  Will watch your condition.  Will get help right away if you are not doing well or get worse.   This information is not intended to replace advice given to you by your health care provider. Make sure you discuss any questions you have with your health care provider.   Document Released: 05/03/2005 Document Revised: 07/26/2011 Document Reviewed: 01/05/2013 Elsevier Interactive Patient Education [employer]

## 2015-08-08 NOTE — ED Notes (Signed)
Pt rolled out of the bed and has skin tear to right forearm.

## 2015-08-08 NOTE — ED Notes (Signed)
C-collar applied

## 2015-08-08 NOTE — ED Provider Notes (Signed)
[facility] Emergency Department Provider Note  ____________________________________________  Time seen: 3:20 AM  I have reviewed the triage vital signs and the nursing notes.   HISTORY  Chief Complaint Abrasion      HPI Deborah [name] is a 70 y.o. female presents with history of axillary falling out of bed his morning with resultant right forearm abrasions. Patient states that she also hit the posterior portion of her head. Patient admits to currently 7 out of 10 left-sided neck pain. In addition the patient asked to be evaluated for sore throat and cough that she's had for approximately one week. Patient denies any fever. Patient admits to one pack a day cigarette smoking habit.    Past Medical History  Diagnosis Date  . Asthma   . Ulcer   . Heart murmur   . Cystitis   . Arthritis   . Depression   . Spastic colon   . IBS (irritable bowel syndrome)   . Osteoporosis   . Anxiety     Patient Active Problem List   Diagnosis Date Noted  . Anxiety as acute reaction to exceptional stress 07/23/2015  . Generalized anxiety disorder 07/23/2015  . Overdose 07/03/2015  . Mild dementia 05/29/2015  . 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 Depression, major, recurrent, moderate (HCC) 04/01/2015  . Bipolar 1 disorder, depressed, moderate (HCC)   . Clinical depression 12/24/2014  . H/O disease 09/08/2014  . History of migraine headaches 09/08/2014  . H/O Bell's palsy 09/08/2014  . H/O: HTN (hypertension) 09/08/2014  . Gastroesophageal reflux disease without esophagitis 09/08/2014  . CAFL (chronic airflow limitation) (HCC) 09/08/2014  . History of asthma 09/08/2014  . 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 Cholecystectomy    . Spine surgery    . Colonoscopy  2002  . Upper gi endoscopy  2011  . Arm surgery Left 2011    fracture repair   . Throat biopsy   2012  . Shoulder surgery    . Elbow surgery    . Breast biopsy      LCIS    Current Outpatient Rx  Name  Route  Sig  Dispense  Refill  . donepezil (ARICEPT) 5 MG tablet   Oral   Take 5 mg by mouth daily.         Marland Kitchen escitalopram (LEXAPRO) 10 MG tablet   Oral   Take 10 mg by mouth daily.         . hydrOXYzine (ATARAX/VISTARIL) 50 MG tablet   Oral   Take 1 tablet (50 mg total) by mouth 3 (three) times daily as needed for anxiety.   30 tablet   0   . LORazepam (ATIVAN) 0.5 MG tablet   Oral   Take 0.5 mg by mouth at bedtime.         . ondansetron (ZOFRAN) 4 MG tablet   Oral   Take 1 tablet (4 mg total) by mouth every 6 (six) hours.   20 tablet   0   . QUEtiapine (SEROQUEL) 25 MG tablet   Oral   Take 1 tablet (25 mg total) by mouth 2 (two) times daily.   15 tablet   8     Please dispense only 1 week supply at a time.   . traZODone (DESYREL) 50 MG tablet   Oral   Take 1 tablet (50 mg total) by mouth at  bedtime.   7 tablet   8     Please dispense 1 week supply at a time.     Allergies Prednisone  Family History  Problem Relation Age of Onset  . Cancer Sister     ovarian  . Cancer Maternal Aunt     breast  . Cancer Maternal Aunt     breast  . Heart attack Mother     Social History Social History  Substance Use Topics  . Smoking status: Current Every Day Smoker -- 1.00 packs/day for 20 years    Types: Cigarettes    Start date: 03/31/1977  . Smokeless tobacco: Never Used  . Alcohol Use: No     Comment: Ocassional    Review of Systems  Constitutional: Negative for fever. Eyes: Negative for visual changes. ENT: Negative for sore throat. Cardiovascular: Negative for chest pain. Respiratory: Negative for shortness of breath. Positive for cough Gastrointestinal: Negative for abdominal pain, vomiting and diarrhea. Genitourinary: Negative for dysuria. Musculoskeletal: Negative for back pain. Positive for neck pain Skin:  Negative for rash. Neurological: Negative for headaches, focal weakness or numbness.   10-point ROS otherwise negative.  ____________________________________________   PHYSICAL EXAM:  VITAL SIGNS: ED Triage Vitals  Enc Vitals Group     BP 08/08/15 0305 117/80 mmHg     Pulse Rate 08/08/15 0305 81     Resp 08/08/15 0305 18     Temp 08/08/15 0305 97.7 F (36.5 C)     Temp Source 08/08/15 0305 Oral     SpO2 08/08/15 0305 93 %     Weight 08/08/15 0305 120 lb (54.432 kg)     Height 08/08/15 0305  (1.575 m)     Head Cir --      Peak Flow --      Pain Score 08/08/15 0306 3     Pain Loc --      Pain Edu? --      Excl. in GC? --     Constitutional: Alert and oriented. Well appearing and in no distress. Eyes: Conjunctivae are normal. PERRL. Normal extraocular movements. ENT   Head: Normocephalic and atraumatic.   Nose: No congestion/rhinnorhea.   Mouth/Throat: Mucous membranes are moist.   Neck: No stridor. Hematological/Lymphatic/Immunilogical: No cervical lymphadenopathy. Cardiovascular: Normal rate, regular rhythm. Normal and symmetric distal pulses are present in all extremities. No murmurs, rubs, or gallops. Respiratory: Normal respiratory effort without tachypnea nor retractions. Breath sounds are clear and equal bilaterally. No wheezes/rales/rhonchi. Gastrointestinal: Soft and nontender. No distention. There is no CVA tenderness. Genitourinary: deferred Musculoskeletal: Nontender with normal range of motion in all extremities. No joint effusions.  No lower extremity tenderness nor edema. Neurologic:  Normal speech and language. No gross focal neurologic deficits are appreciated. Speech is normal.  Skin:  Skin is warm, dry and intact. No rash noted. Psychiatric: Mood and affect are normal. Speech and behavior are normal. Patient exhibits appropriate insight and judgment.      RADIOLOGY  CT Head Wo Contrast (Final result) Result time: 08/08/15 04:47:49    Final result by Rad Results In Interface (08/08/15 04:47:49)   Narrative:   CLINICAL DATA: 70 year old female with fall an occipital trauma.  EXAM: CT HEAD WITHOUT CONTRAST  CT CERVICAL SPINE WITHOUT CONTRAST  TECHNIQUE: Multidetector CT imaging of the head and cervical spine was performed following the standard protocol without intravenous contrast. Multiplanar CT image reconstructions of the cervical spine were also generated.  COMPARISON: Head CT dated 11/02/2009  FINDINGS: CT  HEAD FINDINGS  There is stable prominence of the ventricles and sulci compatible with age-related atrophy. Mild periventricular and deep white matter chronic microvascular ischemic changes noted. There is no acute intracranial hemorrhage. There is no mass effect or midline shift.  There is opacification of multiple ethmoid air cells. The remainder of the visualized paranasal sinuses and mastoid air cells are clear. The calvarium is intact.  CT CERVICAL SPINE FINDINGS  There is no acute fracture or subluxation of the cervical spine.There is multilevel degenerative changesThe odontoid and spinous processes are intact.There is normal anatomic alignment of the C1-C2 lateral masses. The visualized soft tissues appear unremarkable.  IMPRESSION: No acute intracranial hemorrhage.  Stable age-related atrophy and chronic microvascular ischemic changes.  No acute/traumatic cervical spine pathology.   Electronically Signed By: Elgie CollardArash Radparvar M.D. On: 08/08/2015 04:47          CT Cervical Spine Wo Contrast (Final result) Result time: 08/08/15 04:47:49   Final result by Rad Results In Interface (08/08/15 04:47:49)   Narrative:   CLINICAL DATA: 70 year old female with fall an occipital trauma.  EXAM: CT HEAD WITHOUT CONTRAST  CT CERVICAL SPINE WITHOUT CONTRAST  TECHNIQUE: Multidetector CT imaging of the head and cervical spine was performed following the standard protocol  without intravenous contrast. Multiplanar CT image reconstructions of the cervical spine were also generated.  COMPARISON: Head CT dated 11/02/2009  FINDINGS: CT HEAD FINDINGS  There is stable prominence of the ventricles and sulci compatible with age-related atrophy. Mild periventricular and deep white matter chronic microvascular ischemic changes noted. There is no acute intracranial hemorrhage. There is no mass effect or midline shift.  There is opacification of multiple ethmoid air cells. The remainder of the visualized paranasal sinuses and mastoid air cells are clear. The calvarium is intact.  CT CERVICAL SPINE FINDINGS  There is no acute fracture or subluxation of the cervical spine.There is multilevel degenerative changesThe odontoid and spinous processes are intact.There is normal anatomic alignment of the C1-C2 lateral masses. The visualized soft tissues appear unremarkable.  IMPRESSION: No acute intracranial hemorrhage.  Stable age-related atrophy and chronic microvascular ischemic changes.  No acute/traumatic cervical spine pathology.   Electronically Signed By: Elgie CollardArash Radparvar M.D. On: 08/08/2015 04:47          DG Chest 2 View (Final result) Result time: 08/08/15 04:30:55   Final result by Rad Results In Interface (08/08/15 04:30:55)   Narrative:   CLINICAL DATA: 70 year old female with cough. History of asthma.  EXAM: CHEST 2 VIEW  COMPARISON: Chest radiograph dated 07/16/2015  FINDINGS: The heart size and mediastinal contours are within normal limits. Both lungs are clear. The visualized skeletal structures are unremarkable.  IMPRESSION: No active cardiopulmonary disease.   Electronically Signed By: Elgie CollardArash Radparvar M.D. On: 08/08/2015 04:30              INITIAL IMPRESSION / ASSESSMENT AND PLAN / ED COURSE  Pertinent labs & imaging results that were available during my care of the patient were reviewed by  me and considered in my medical decision making (see chart for details).  She received Flexeril 10 mg tablet and emergency department will be prescribed the same at home. Abrasions were dressed here in the emergency department.  ____________________________________________   FINAL CLINICAL IMPRESSION(S) / ED DIAGNOSES  Final diagnoses:  Neck pain  Abrasion of right forearm, initial encounter      Darci Currentandolph N Brown, MD 08/08/15 [phone]

## 2015-08-11 ENCOUNTER — Other Ambulatory Visit: Payer: Self-pay | Admitting: Psychiatry

## 2015-08-12 DIAGNOSIS — E538 Deficiency of other specified B group vitamins: Secondary | ICD-10-CM | POA: Diagnosis not present

## 2015-08-14 DIAGNOSIS — F331 Major depressive disorder, recurrent, moderate: Secondary | ICD-10-CM | POA: Diagnosis not present

## 2015-08-27 DIAGNOSIS — F410 Panic disorder [episodic paroxysmal anxiety] without agoraphobia: Secondary | ICD-10-CM | POA: Diagnosis not present

## 2015-09-04 ENCOUNTER — Ambulatory Visit (INDEPENDENT_AMBULATORY_CARE_PROVIDER_SITE_OTHER): Payer: Commercial Managed Care - HMO | Admitting: Psychiatry

## 2015-09-04 VITALS — BP 112/70 | HR 90 | Ht 62.5 in | Wt 122.0 lb

## 2015-09-04 DIAGNOSIS — F3130 Bipolar disorder, current episode depressed, mild or moderate severity, unspecified: Secondary | ICD-10-CM | POA: Diagnosis not present

## 2015-09-04 MED ORDER — ESCITALOPRAM OXALATE 10 MG PO TABS: 10.0000 mg | ORAL_TABLET | Freq: Every day | ORAL | Status: DC

## 2015-09-04 MED ORDER — TRAZODONE HCL 100 MG PO TABS: ORAL_TABLET | ORAL | Status: DC

## 2015-09-04 MED ORDER — QUETIAPINE FUMARATE 25 MG PO TABS: 25.0000 mg | ORAL_TABLET | Freq: Two times a day (BID) | ORAL | Status: DC
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
## 2015-09-08 ENCOUNTER — Telehealth (HOSPITAL_COMMUNITY): Payer: Self-pay

## 2015-09-08 NOTE — Telephone Encounter (Signed)
Patient calling, she states she needs something for her nerves, she says her nerves are really bad. Patient was here for initial visit on 4/20 and has a follow up with you on 5/10. Please review and advise, thank you

## 2015-09-10 MED ORDER — QUETIAPINE FUMARATE 50 MG PO TABS: ORAL_TABLET | ORAL | Status: DC

## 2015-09-10 NOTE — Telephone Encounter (Signed)
Please let this patient regarding significant increase her Seroquel which used for anxiety and irritability. She was taking 25 mg twice a day going to double her dose to 50 mg pill and tell her to one in the morning and 2 at night. This will be the only change I'll make an tell her to be sure to keep her next appointment

## 2015-09-11 ENCOUNTER — Emergency Department: Payer: Commercial Managed Care - HMO

## 2015-09-11 ENCOUNTER — Emergency Department
Admission: EM | Admit: 2015-09-11 | Discharge: 2015-09-11 | Disposition: A | Discharge status: Home / Self Care | Payer: Commercial Managed Care - HMO | Attending: Emergency Medicine | Admitting: Emergency Medicine

## 2015-09-11 ENCOUNTER — Encounter: Payer: Self-pay | Admitting: Emergency Medicine

## 2015-09-11 ENCOUNTER — Ambulatory Visit (HOSPITAL_COMMUNITY): Payer: Commercial Managed Care - HMO | Admitting: Psychiatry

## 2015-09-11 DIAGNOSIS — F0281 Dementia in other diseases classified elsewhere with behavioral disturbance: Secondary | ICD-10-CM | POA: Diagnosis not present

## 2015-09-11 DIAGNOSIS — R109 Unspecified abdominal pain: Secondary | ICD-10-CM

## 2015-09-11 DIAGNOSIS — F329 Major depressive disorder, single episode, unspecified: Secondary | ICD-10-CM | POA: Insufficient documentation

## 2015-09-11 DIAGNOSIS — M1990 Unspecified osteoarthritis, unspecified site: Secondary | ICD-10-CM | POA: Diagnosis not present

## 2015-09-11 DIAGNOSIS — R1031 Right lower quadrant pain: Secondary | ICD-10-CM | POA: Diagnosis not present

## 2015-09-11 DIAGNOSIS — M810 Age-related osteoporosis without current pathological fracture: Secondary | ICD-10-CM | POA: Insufficient documentation

## 2015-09-11 DIAGNOSIS — E538 Deficiency of other specified B group vitamins: Secondary | ICD-10-CM | POA: Insufficient documentation

## 2015-09-11 DIAGNOSIS — F331 Major depressive disorder, recurrent, moderate: Secondary | ICD-10-CM | POA: Diagnosis not present

## 2015-09-11 DIAGNOSIS — G308 Other Alzheimer's disease: Secondary | ICD-10-CM | POA: Diagnosis not present

## 2015-09-11 DIAGNOSIS — R1011 Right upper quadrant pain: Secondary | ICD-10-CM | POA: Diagnosis not present

## 2015-09-11 DIAGNOSIS — K3189 Other diseases of stomach and duodenum: Secondary | ICD-10-CM | POA: Diagnosis not present

## 2015-09-11 DIAGNOSIS — F419 Anxiety disorder, unspecified: Secondary | ICD-10-CM | POA: Diagnosis not present

## 2015-09-11 DIAGNOSIS — F17210 Nicotine dependence, cigarettes, uncomplicated: Secondary | ICD-10-CM | POA: Insufficient documentation

## 2015-09-11 DIAGNOSIS — R1032 Left lower quadrant pain: Secondary | ICD-10-CM | POA: Diagnosis not present

## 2015-09-11 DIAGNOSIS — K2970 Gastritis, unspecified, without bleeding: Secondary | ICD-10-CM

## 2015-09-11 LAB — URINALYSIS COMPLETE WITH MICROSCOPIC (ARMC ONLY)
BILIRUBIN URINE: NEGATIVE
Bacteria, UA: NONE SEEN
Glucose, UA: NEGATIVE mg/dL
HGB URINE DIPSTICK: NEGATIVE
Ketones, ur: NEGATIVE mg/dL
LEUKOCYTES UA: NEGATIVE
Nitrite: NEGATIVE
PH: 7 (ref 5.0–8.0)
Protein, ur: NEGATIVE mg/dL
Specific Gravity, Urine: 1.011 (ref 1.005–1.030)
WBC UA: NONE SEEN WBC/hpf (ref 0–5)

## 2015-09-11 LAB — CBC
HEMATOCRIT: 39 % (ref 35.0–47.0)
HEMOGLOBIN: 13.4 g/dL (ref 12.0–16.0)
MCH: 32.3 pg (ref 26.0–34.0)
MCHC: 34.3 g/dL (ref 32.0–36.0)
MCV: 94 fL (ref 80.0–100.0)
PLATELETS: 141 10*3/uL — AB (ref 150–440)
RBC: 4.15 MIL/uL (ref 3.80–5.20)
RDW: 15 % — ABNORMAL HIGH (ref 11.5–14.5)
WBC: 7.9 10*3/uL (ref 3.6–11.0)

## 2015-09-11 LAB — COMPREHENSIVE METABOLIC PANEL
ALT: 15 U/L (ref 14–54)
ANION GAP: 9 (ref 5–15)
AST: 21 U/L (ref 15–41)
Albumin: 4.2 g/dL (ref 3.5–5.0)
Alkaline Phosphatase: 50 U/L (ref 38–126)
BUN: 23 mg/dL — ABNORMAL HIGH (ref 6–20)
CHLORIDE: 107 mmol/L (ref 101–111)
CO2: 22 mmol/L (ref 22–32)
CREATININE: 1.13 mg/dL — AB (ref 0.44–1.00)
Calcium: 9.2 mg/dL (ref 8.9–10.3)
GFR, EST AFRICAN AMERICAN: 57 mL/min — AB (ref >60)
GFR, EST NON AFRICAN AMERICAN: 49 mL/min — AB (ref >60)
Glucose, Bld: 111 mg/dL — ABNORMAL HIGH (ref 65–99)
POTASSIUM: 4 mmol/L (ref 3.5–5.1)
SODIUM: 138 mmol/L (ref 135–145)
Total Bilirubin: 1.1 mg/dL (ref 0.3–1.2)
Total Protein: 7.1 g/dL (ref 6.5–8.1)

## 2015-09-11 LAB — TROPONIN I: Troponin I: <0.03 ng/mL (ref <0.031)

## 2015-09-11 LAB — LIPASE, BLOOD: LIPASE: 165 U/L — AB (ref 11–51)

## 2015-09-11 MED ORDER — RANITIDINE HCL 150 MG PO TABS: 150.0000 mg | ORAL_TABLET | Freq: Every day | ORAL | Status: DC

## 2015-09-11 MED ORDER — MORPHINE SULFATE (PF) 2 MG/ML IV SOLN
INTRAVENOUS | Status: AC
  Administered 2015-09-11: 2 mg via INTRAVENOUS
  Filled 2015-09-11: qty 1

## 2015-09-11 MED ORDER — MORPHINE SULFATE (PF) 4 MG/ML IV SOLN
4.0000 mg | Freq: Once | INTRAVENOUS | Status: AC
  Administered 2015-09-11: 4 mg via INTRAVENOUS
  Filled 2015-09-11: qty 1

## 2015-09-11 MED ORDER — MORPHINE SULFATE (PF) 2 MG/ML IV SOLN
2.0000 mg | Freq: Once | INTRAVENOUS | Status: AC
  Administered 2015-09-11: 2 mg via INTRAVENOUS

## 2015-09-11 MED ORDER — IOPAMIDOL (ISOVUE-300) INJECTION 61%
75.0000 mL | Freq: Once | INTRAVENOUS | Status: AC | PRN
Start: 2015-09-11 — End: 2015-09-11
  Administered 2015-09-11: 75 mL via INTRAVENOUS

## 2015-09-11 MED ORDER — ONDANSETRON 4 MG PO TBDP: 4.0000 mg | ORAL_TABLET | Freq: Four times a day (QID) | ORAL | Status: DC | PRN

## 2015-09-11 MED ORDER — SODIUM CHLORIDE 0.9 % IV BOLUS (SEPSIS)
1000.0000 mL | Freq: Once | INTRAVENOUS | Status: AC
Start: 2015-09-11 — End: 2015-09-11
  Administered 2015-09-11: 1000 mL via INTRAVENOUS

## 2015-09-11 NOTE — ED Notes (Signed)
Pt reports pain under left ribs and right lower abdominal pain. Pt denies urinary symptoms.

## 2015-09-11 NOTE — ED Notes (Signed)
Patient to ER for c/o left upper quadrant/rib pain, as well as RLQ abdominal pain. Patient states symptoms began yesterday, but worsened through the night. Patient states pain is most severe at ribs.

## 2015-09-11 NOTE — ED Notes (Signed)
Discussed discharge instructions, prescriptions, and follow-up care with patient. No questions or concerns at this time. Pt stable at discharge.  

## 2015-09-11 NOTE — ED Provider Notes (Signed)
[facility] Emergency Department Provider Note  ____________________________________________  Time seen: Approximately 7:08 AM  I have reviewed the triage vital signs and the nursing notes.   HISTORY  Chief Complaint Abdominal Pain    HPI Deborah [name] is a 70 y.o. female previous history of depression, irritable bowel syndrome, cystitis,  Patient reports that yesterday she began having some pain in her abdomen. It comes and goes. Dislocated in the left upper abdomen and then also some in the right lower abdomen. She no reports no fevers chills nausea vomiting or trouble with bowel movements. No black or bloody stool. No vomiting up blood.  She reports the pain seems to come and go, nothing seems to make it better or worse, and will sometimes last for a few seconds, sometimes for 15 minutes.  She denies any chest pain, trouble breathing.  She is a smoker. She does not drink alcohol.  She's had a previous cholecystectomy.   Past Medical History  Diagnosis Date  . Asthma   . Ulcer   . Heart murmur   . Cystitis   . Arthritis   . Depression   . Spastic colon   . IBS (irritable bowel syndrome)   . Osteoporosis   . Anxiety     Patient Active Problem List   Diagnosis Date Noted  . Anxiety as acute reaction to exceptional stress 07/23/2015  . Generalized anxiety disorder 07/23/2015  . Overdose 07/03/2015  . Mild dementia 05/29/2015  . 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 Depression, major, recurrent, moderate (HCC) 04/01/2015  . Bipolar 1 disorder, depressed, moderate (HCC)   . Clinical depression 12/24/2014  . H/O disease 09/08/2014  . History of migraine headaches 09/08/2014  . H/O Bell's palsy 09/08/2014  . H/O: HTN (hypertension) 09/08/2014  . Gastroesophageal reflux disease without esophagitis 09/08/2014  . CAFL (chronic airflow limitation) (HCC) 09/08/2014  . History of  asthma 09/08/2014  . 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 Cholecystectomy    . Spine surgery    . Colonoscopy  2002  . Upper gi endoscopy  2011  . Arm surgery Left 2011    fracture repair  . Throat biopsy   2012  . Shoulder surgery    . Elbow surgery    . Breast biopsy      LCIS    Current Outpatient Rx  Name  Route  Sig  Dispense  Refill  . alendronate (FOSAMAX) 70 MG tablet   Oral   Take 1 tablet by mouth once a week.      5   . divalproex (DEPAKOTE) 250 MG DR tablet   Oral   Take 1 tablet by mouth 2 (two) times daily.      5   . donepezil (ARICEPT) 5 MG tablet   Oral   Take 5 mg by mouth daily.         Marland Kitchen escitalopram (LEXAPRO) 10 MG tablet   Oral   Take 1 tablet (10 mg total) by mouth daily.   30 tablet   3   . hydrOXYzine (ATARAX/VISTARIL) 50 MG tablet   Oral   Take 1 tablet (50 mg total) by mouth 3 (three) times daily as needed for anxiety.   30 tablet   0   . QUEtiapine (SEROQUEL) 25 MG tablet   Oral   Take 1 tablet by mouth 2 (two) times daily.  3   . traZODone (DESYREL) 100 MG tablet      1 qhs Patient taking differently: Take 100 mg by mouth at bedtime.    30 tablet   3     Please dispense 1 week supply at a time.   . ondansetron (ZOFRAN ODT) 4 MG disintegrating tablet   Oral   Take 1 tablet (4 mg total) by mouth every 6 (six) hours as needed for nausea or vomiting.   20 tablet   0   . ranitidine (ZANTAC) 150 MG tablet   Oral   Take 1 tablet (150 mg total) by mouth at bedtime.   30 tablet   0     Allergies Prednisone  Family History  Problem Relation Age of Onset  . Cancer Sister     ovarian  . Cancer Maternal Aunt     breast  . Cancer Maternal Aunt     breast  . Heart attack Mother     Social History Social History  Substance Use Topics  . Smoking status: Current Every Day Smoker -- 1.00 packs/day for 20 years    Types: Cigarettes    Start date:  03/31/1977  . Smokeless tobacco: Never Used  . Alcohol Use: No     Comment: Ocassional    Review of Systems Constitutional: No fever/chills.Patient reports she is very anxious that something could be wrong. Eyes: No visual changes. ENT: No sore throat. Cardiovascular: Denies chest pain. Respiratory: Denies shortness of breath. Gastrointestinal:   No nausea, no vomiting.  No diarrhea.  No constipation. Genitourinary: Negative for dysuria. Musculoskeletal: Negative for back pain. Skin: Negative for rash. Neurological: Negative for headaches, focal weakness or numbness.  Patient's husband reports that she is very anxious almost all the time. Anxiety is normal for her. She denies any hallucinations, thoughts or desires to harm herself or anyone else. Husband confirms that she is been acting in her normal self which is just very, very anxious.  10-point ROS otherwise negative.  ____________________________________________   PHYSICAL EXAM:  VITAL SIGNS: ED Triage Vitals  Enc Vitals Group     BP 09/11/15 0627 124/59 mmHg     Pulse Rate 09/11/15 0627 83     Resp --      Temp 09/11/15 0627 98.2 F (36.8 C)     Temp Source 09/11/15 0627 Oral     SpO2 --      Weight 09/11/15 0627 114 lb (51.71 kg)     Height 09/11/15 0627 5' 2.5" (1.588 m)     Head Cir --      Peak Flow --      Pain Score 09/11/15 0629 8     Pain Loc --      Pain Edu? --      Excl. in GC? --    Constitutional: Alert and oriented. Well appearing and in no acute distress, but fairly anxious. Eyes: Conjunctivae are normal. PERRL. EOMI. Head: Atraumatic. Nose: No congestion/rhinnorhea. Mouth/Throat: Mucous membranes are moist.  Oropharynx non-erythematous. Neck: No stridor.   Cardiovascular: Normal rate, regular rhythm. Grossly normal heart sounds.  Good peripheral circulation. Respiratory: Normal respiratory effort.  No retractions. Lungs CTAB. Gastrointestinal: Soft and nontender in the left lower abdomen,  and right upper quadrant however is moderate tenderness periumbilically, also some left upper quadrant, minimal discomfort in the right lower quadrant. No rebound or guarding.. No distention. No abdominal bruits. No CVA tenderness. Musculoskeletal: No lower extremity tenderness nor edema.  Neurologic:  Normal speech  and language. No gross focal neurologic deficits are appreciated. Skin:  Skin is warm, dry and intact. No rash noted. Psychiatric: Mood and affect are slightly elevated and somewhat anxious. Speech and behavior are normal.  ____________________________________________   LABS (all labs ordered are listed, but only abnormal results are displayed)  Labs Reviewed  LIPASE, BLOOD - Abnormal; Notable for the following:    Lipase 165 (*)    All other components within normal limits  COMPREHENSIVE METABOLIC PANEL - Abnormal; Notable for the following:    Glucose, Bld 111 (*)    BUN 23 (*)    Creatinine, Ser 1.13 (*)    GFR calc non Af Amer 49 (*)    GFR calc Af Amer 57 (*)    All other components within normal limits  CBC - Abnormal; Notable for the following:    RDW 15.0 (*)    Platelets 141 (*)    All other components within normal limits  URINALYSIS COMPLETEWITH MICROSCOPIC ([facility] ONLY) - Abnormal; Notable for the following:    Color, Urine STRAW (*)    APPearance CLEAR (*)    Squamous Epithelial / LPF 0-5 (*)    All other components within normal limits  TROPONIN I   ____________________________________________  EKG  Reviewed and interpreted by me at 6:35 AM Injury 5 PR 1:30 QRS 70 QTc 450   Some mild baseline artifact, but overall no significant abnormality is noted. No evidence of acute ischemic change noted. ____________________________________________  RADIOLOGY   CT Abdomen Pelvis W Contrast (Final result) Result time: 09/11/15 08:27:06   Final result by Rad Results In Interface (09/11/15 08:27:06)   Narrative:   CLINICAL DATA: Left-sided abdominal  pain for 2 days  EXAM: CT ABDOMEN AND PELVIS WITH CONTRAST  TECHNIQUE: Multidetector CT imaging of the abdomen and pelvis was performed using the standard protocol following bolus administration of intravenous contrast.  CONTRAST: 75mL ISOVUE-300 IOPAMIDOL (ISOVUE-300) INJECTION 61%  COMPARISON: None.  FINDINGS: Lung bases are free of acute infiltrate or sizable effusion.  Postsurgical changes are noted adjacent to the stomach. Stomach is somewhat hyperemic with wall thickening. This may represent some underlying gastritis.  The gallbladder has been surgically removed. The liver, spleen, adrenal glands and pancreas are within normal limits. The kidneys are well visualized bilaterally. No renal calculi or obstructive changes are noted. Normal excretion of contrast is noted on delayed images.  Aortoiliac calcifications are seen without aneurysmal dilatation. The bladder is well distended. The uterus and ovaries are within normal limits.  The appendix is well visualized and within normal limits. No significant diverticular change of the colon is noted. No free pelvic fluid is seen. The bony structures show postoperative change at L5-S1 on the left. Degenerative changes of the lumbar spine are seen.  IMPRESSION: Postsurgical changes.  Wall thickening and hyperemia within the stomach likely related to a component of gastritis.  No other focal abnormality is seen.   Electronically Signed By: Alcide CleverMark Lukens M.D. On: 09/11/2015 08:27    ____________________________________________   PROCEDURES  Procedure(s) performed: None  Critical Care performed: No  ____________________________________________   INITIAL IMPRESSION / ASSESSMENT AND PLAN / ED COURSE  Pertinent labs & imaging results that were available during my care of the patient were reviewed by me and considered in my medical decision making (see chart for details).  Differential diagnosis includes  but is not limited to, abdominal perforation, aortic dissection, cholecystitis, appendicitis, diverticulitis, colitis, esophagitis/gastritis, kidney stone, pyelonephritis, urinary tract infection, aortic aneurysm. All are  considered in decision and treatment plan. Based upon the patient's presentation and risk factors, we'll proceed with CT of the abdomen and pelvis. In addition she does not complain of any acute heart attack or pulmonary complaints, however she does have an element of pain in the left upper abdomen, and based on this I will send a troponin.  Denies any symptoms such as ripping or tearing pain to suggest dissection. No shortness of breath area no tachycardia, no hypoxia. No signs of symptoms indicate other causes such as pneumothorax, pulmonary embolism, or obvious acute coronary.  ----------------------------------------- 10:34 AM on 09/11/2015 -----------------------------------------  The patient reports her pain is better. She is ambulatory with normal gait. She was able to drink a cup of coffee without incident. She is awake alert in no distress. She is no longer having any abdominal pain at this time. She had a CT scan with scissors reassuring except for some possible gastritis-like changes. We'll discharge her to home, provide a prescription for Zantac and Zofran. The abdominal pain and return precautions and follow-up care discussed with the patient.  ____________________________________________   FINAL CLINICAL IMPRESSION(S) / ED DIAGNOSES  Final diagnoses:  Abdominal pain, unspecified abdominal location  Gastritis      Sharyn Creamer, MD 09/11/15 1035

## 2015-09-11 NOTE — ED Notes (Signed)
Patient transported to CT 

## 2015-09-11 NOTE — Discharge Instructions (Signed)
No driving today.  You were seen in the emergency room for abdominal pain. It is important that you follow up closely with your primary care doctor in the next couple of days.  If you're unable to see her primary care doctor you may return to the emergency room or go to the [facility] walk-in clinic in 1 or 2 days for reexam.  Please return to the emergency room right away if you are to develop a fever, severe nausea, your pain becomes severe or worsens, you are unable to keep food down, begin vomiting any dark or bloody fluid, you develop any dark or bloody stools, feel dehydrated, or other new concerns or symptoms arise.   Abdominal Pain, Adult Many things can cause abdominal pain. Usually, abdominal pain is not caused by a disease and will improve without treatment. It can often be observed and treated at home. Your health care provider will do a physical exam and possibly order blood tests and X-rays to help determine the seriousness of your pain. However, in many cases, more time must pass before a clear cause of the pain can be found. Before that point, your health care provider may not know if you need more testing or further treatment. HOME CARE INSTRUCTIONS Monitor your abdominal pain for any changes. The following actions may help to alleviate any discomfort you are experiencing:  Only take over-the-counter or prescription medicines as directed by your health care provider.  Do not take laxatives unless directed to do so by your health care provider.  Try a clear liquid diet (broth, tea, or water) as directed by your health care provider. Slowly move to a bland diet as tolerated. SEEK MEDICAL CARE IF:  You have unexplained abdominal pain.  You have abdominal pain associated with nausea or diarrhea.  You have pain when you urinate or have a bowel movement.  You experience abdominal pain that wakes you in the night.  You have abdominal pain that is worsened or improved by eating  food.  You have abdominal pain that is worsened with eating fatty foods.  You have a fever. SEEK IMMEDIATE MEDICAL CARE IF:  Your pain does not go away within 2 hours.  You keep throwing up (vomiting).  Your pain is felt only in portions of the abdomen, such as the right side or the left lower portion of the abdomen.  You pass bloody or black tarry stools. MAKE SURE YOU:  Understand these instructions.  Will watch your condition.  Will get help right away if you are not doing well or get worse.   This information is not intended to replace advice given to you by your health care provider. Make sure you discuss any questions you have with your health care provider.   Document Released: 02/10/2005 Document Revised: 01/22/2015 Document Reviewed: 01/10/2013 Elsevier Interactive Patient Education [employer]2016 [employer]vier Inc.

## 2015-09-14 ENCOUNTER — Emergency Department
Admission: EM | Admit: 2015-09-14 | Discharge: 2015-09-14 | Disposition: A | Discharge status: Home / Self Care | Payer: Commercial Managed Care - HMO | Attending: Emergency Medicine | Admitting: Emergency Medicine

## 2015-09-14 ENCOUNTER — Ambulatory Visit
Admission: EM | Admit: 2015-09-14 | Discharge: 2015-09-14 | Disposition: A | Discharge status: Home / Self Care | Payer: Commercial Managed Care - HMO | Attending: Family Medicine | Admitting: Family Medicine

## 2015-09-14 DIAGNOSIS — R109 Unspecified abdominal pain: Secondary | ICD-10-CM | POA: Insufficient documentation

## 2015-09-14 DIAGNOSIS — Z5321 Procedure and treatment not carried out due to patient leaving prior to being seen by health care provider: Secondary | ICD-10-CM | POA: Diagnosis not present

## 2015-09-14 DIAGNOSIS — R1013 Epigastric pain: Secondary | ICD-10-CM | POA: Diagnosis not present

## 2015-09-14 LAB — CBC WITH DIFFERENTIAL/PLATELET
BASOS PCT: 0 %
Basophils Absolute: 0 10*3/uL (ref 0–0.1)
EOS ABS: 0.3 10*3/uL (ref 0–0.7)
EOS PCT: 3 %
HCT: 36.6 % (ref 35.0–47.0)
Hemoglobin: 12.3 g/dL (ref 12.0–16.0)
LYMPHS PCT: 39 %
Lymphs Abs: 3.7 10*3/uL — ABNORMAL HIGH (ref 1.0–3.6)
MCH: 32 pg (ref 26.0–34.0)
MCHC: 33.6 g/dL (ref 32.0–36.0)
MCV: 95.5 fL (ref 80.0–100.0)
Monocytes Absolute: 0.8 10*3/uL (ref 0.2–0.9)
Monocytes Relative: 8 %
Neutro Abs: 4.8 10*3/uL (ref 1.4–6.5)
Neutrophils Relative %: 50 %
PLATELETS: 131 10*3/uL — AB (ref 150–440)
RBC: 3.84 MIL/uL (ref 3.80–5.20)
RDW: 15 % — ABNORMAL HIGH (ref 11.5–14.5)
WBC: 9.6 10*3/uL (ref 3.6–11.0)

## 2015-09-14 LAB — COMPREHENSIVE METABOLIC PANEL
ALBUMIN: 4.1 g/dL (ref 3.5–5.0)
ALT: 15 U/L (ref 14–54)
ALT: 15 U/L (ref 14–54)
ANION GAP: 7 (ref 5–15)
AST: 20 U/L (ref 15–41)
AST: 18 U/L (ref 15–41)
Albumin: 3.9 g/dL (ref 3.5–5.0)
Alkaline Phosphatase: 52 U/L (ref 38–126)
Alkaline Phosphatase: 48 U/L (ref 38–126)
Anion gap: 5 (ref 5–15)
BUN: 26 mg/dL — AB (ref 6–20)
BUN: 23 mg/dL — ABNORMAL HIGH (ref 6–20)
CHLORIDE: 108 mmol/L (ref 101–111)
CHLORIDE: 111 mmol/L (ref 101–111)
CO2: 24 mmol/L (ref 22–32)
CO2: 23 mmol/L (ref 22–32)
Calcium: 9 mg/dL (ref 8.9–10.3)
Calcium: 8.8 mg/dL — ABNORMAL LOW (ref 8.9–10.3)
Creatinine, Ser: 1.08 mg/dL — ABNORMAL HIGH (ref 0.44–1.00)
Creatinine, Ser: 1.13 mg/dL — ABNORMAL HIGH (ref 0.44–1.00)
GFR calc Af Amer: 60 mL/min — ABNORMAL LOW (ref >60)
GFR calc non Af Amer: 52 mL/min — ABNORMAL LOW (ref >60)
GFR calc non Af Amer: 49 mL/min — ABNORMAL LOW (ref >60)
GFR, EST AFRICAN AMERICAN: 57 mL/min — AB (ref >60)
GLUCOSE: 96 mg/dL (ref 65–99)
Glucose, Bld: 98 mg/dL (ref 65–99)
POTASSIUM: 4.2 mmol/L (ref 3.5–5.1)
POTASSIUM: 4.3 mmol/L (ref 3.5–5.1)
SODIUM: 137 mmol/L (ref 135–145)
SODIUM: 141 mmol/L (ref 135–145)
Total Bilirubin: 0.5 mg/dL (ref 0.3–1.2)
Total Bilirubin: 0.4 mg/dL (ref 0.3–1.2)
Total Protein: 6.9 g/dL (ref 6.5–8.1)
Total Protein: 6.5 g/dL (ref 6.5–8.1)

## 2015-09-14 LAB — TROPONIN I: Troponin I: <0.03 ng/mL (ref <0.031)

## 2015-09-14 LAB — URINALYSIS COMPLETE WITH MICROSCOPIC (ARMC ONLY)
Bacteria, UA: NONE SEEN
Bilirubin Urine: NEGATIVE
Glucose, UA: NEGATIVE mg/dL
Hgb urine dipstick: NEGATIVE
KETONES UR: NEGATIVE mg/dL
Leukocytes, UA: NEGATIVE
Nitrite: NEGATIVE
PH: 6 (ref 5.0–8.0)
PROTEIN: NEGATIVE mg/dL
Specific Gravity, Urine: 1.017 (ref 1.005–1.030)

## 2015-09-14 LAB — CBC
HEMATOCRIT: 36.3 % (ref 35.0–47.0)
HEMOGLOBIN: 12.2 g/dL (ref 12.0–16.0)
MCH: 32 pg (ref 26.0–34.0)
MCHC: 33.7 g/dL (ref 32.0–36.0)
MCV: 95 fL (ref 80.0–100.0)
Platelets: 133 10*3/uL — ABNORMAL LOW (ref 150–440)
RBC: 3.82 MIL/uL (ref 3.80–5.20)
RDW: 14.9 % — ABNORMAL HIGH (ref 11.5–14.5)
WBC: 8.8 10*3/uL (ref 3.6–11.0)

## 2015-09-14 LAB — LIPASE, BLOOD
LIPASE: 118 U/L — AB (ref 11–51)
LIPASE: 50 U/L (ref 11–51)

## 2015-09-14 MED ORDER — ONDANSETRON 8 MG PO TBDP
8.0000 mg | ORAL_TABLET | Freq: Once | ORAL | Status: AC
  Administered 2015-09-14: 8 mg via ORAL

## 2015-09-14 MED ORDER — HYDROCODONE-ACETAMINOPHEN 5-325 MG PO TABS: ORAL_TABLET | ORAL | Status: DC

## 2015-09-14 MED ORDER — FAMOTIDINE 20 MG PO TABS
20.0000 mg | ORAL_TABLET | Freq: Once | ORAL | Status: AC
  Administered 2015-09-14: 20 mg via ORAL

## 2015-09-14 NOTE — ED Provider Notes (Signed)
CSN: [id number]     Arrival date & time 09/14/15  1530 History   None    Chief Complaint  Patient presents with  . Abdominal Pain   (Consider location/radiation/quality/duration/timing/severity/associated sxs/prior Treatment) HPI Comments: 70 yo female with a complaint of abdominal pain since yesterday associated with nausea.  Denies diarrhea, fevers, chills. Recently seen in ED with similar and had an extensive work up.   The history is provided by the patient.    Past Medical History  Diagnosis Date  . Asthma   . Ulcer   . Heart murmur   . Cystitis   . Arthritis   . Depression   . Spastic colon   . IBS (irritable bowel syndrome)   . Osteoporosis   . Anxiety    Past Surgical History  Procedure Laterality Date  . Cholecystectomy    . Spine surgery    . Colonoscopy  2002  . Upper gi endoscopy  2011  . Arm surgery Left 2011    fracture repair  . Throat biopsy   2012  . Shoulder surgery    . Elbow surgery    . Breast biopsy      LCIS   Family History  Problem Relation Age of Onset  . Cancer Sister     ovarian  . Cancer Maternal Aunt     breast  . Cancer Maternal Aunt     breast  . Heart attack Mother    Social History  Substance Use Topics  . Smoking status: Current Every Day Smoker -- 1.00 packs/day for 20 years    Types: Cigarettes    Start date: 03/31/1977  . Smokeless tobacco: Never Used  . Alcohol Use: No     Comment: Ocassional   OB History    Gravida Para Term Preterm AB TAB SAB Ectopic Multiple Living   1         2      Obstetric Comments   Menstrual age: 66  Age 1st Pregnancy: 43     Review of Systems  Allergies  Dristan cold and Prednisone  Home Medications   Prior to Admission medications   Medication Sig Start Date End Date Taking? Authorizing Provider  donepezil (ARICEPT) 5 MG tablet Take 5 mg by mouth at bedtime.    Yes Historical Provider, MD  escitalopram (LEXAPRO) 10 MG tablet Take 1 tablet (10 mg total) by mouth daily.  09/04/15  Yes Archer Asa, MD  ondansetron (ZOFRAN ODT) 4 MG disintegrating tablet Take 1 tablet (4 mg total) by mouth every 6 (six) hours as needed for nausea or vomiting. 09/11/15  Yes Sharyn Creamer, MD  alendronate (FOSAMAX) 70 MG tablet Take 1 tablet by mouth once a week. 08/29/15   Historical Provider, MD  diazepam (VALIUM) 2 MG tablet Take 1 tablet (2 mg total) by mouth every 12 (twelve) hours as needed for anxiety. 09/17/15 09/16/16  Jami L Hagler, PA-C  divalproex (DEPAKOTE) 250 MG DR tablet Take 250-500 tablets by mouth 2 (two) times daily. Take  twice daily for 3 days, then take  in the morning and  at bedtime for 3 days, then take  twice daily 09/14/15   Historical Provider, MD  HYDROcodone-acetaminophen (NORCO/VICODIN) 5-325 MG tablet 1 tab po q 12 hours prn Patient taking differently: Take 1 tablet by mouth every 12 (twelve) hours as needed for severe pain.  09/14/15   Payton Mccallum, MD  hydrOXYzine (ATARAX/VISTARIL) 25 MG tablet Take 25 mg by mouth 3 (three) times  daily as needed for anxiety.     Historical Provider, MD  QUEtiapine (SEROQUEL) 50 MG tablet Take 50-100 mg by mouth 2 (two) times daily. Take 1 tablet (50mg ) in the morning and 2 tablets (100mg ) at bedtime    Historical Provider, MD  ranitidine (ZANTAC) 150 MG tablet Take 1 tablet (150 mg total) by mouth at bedtime. 09/11/15 09/10/16  Sharyn CreamerMark Quale, MD  traZODone (DESYREL) 100 MG tablet Take 100 mg by mouth at bedtime.    Historical Provider, MD   Meds Ordered and Administered this Visit   Medications  ondansetron (ZOFRAN-ODT) disintegrating tablet 8 mg (8 mg Oral Given 09/14/15 1611)  famotidine (PEPCID) tablet 20 mg (20 mg Oral Given 09/14/15 1644)    BP 122/73 mmHg  Pulse 88  Temp(Src) 98.4 F (36.9 C) (Oral)  Resp 16  Ht 5' 2.5" (1.588 m)  Wt 123 lb (55.792 kg)  BMI 22.12 kg/m2  SpO2 96% No data found.   Physical Exam  Constitutional: She appears well-developed.  Cardiovascular: Normal rate, regular  rhythm, normal heart sounds and intact distal pulses.   No murmur heard. Pulmonary/Chest: Effort normal and breath sounds normal. No respiratory distress. She has no wheezes. She has no rales.  Abdominal: Soft. Bowel sounds are normal. She exhibits no distension and no mass. There is tenderness (mild epigastric tenderness to palpation; no rebound or guarding). There is no rebound and no guarding.  Neurological: She is alert.  Skin: Skin is warm and dry. No rash noted. She is not diaphoretic. No erythema.  Nursing note and vitals reviewed.   ED Course  Procedures (including critical care time)  Labs Review Labs Reviewed  CBC WITH DIFFERENTIAL/PLATELET - Abnormal; Notable for the following:    RDW 15.0 (*)    Platelets 131 (*)    Lymphs Abs 3.7 (*)    All other components within normal limits  LIPASE, BLOOD - Abnormal; Notable for the following:    Lipase 118 (*)    All other components within normal limits  COMPREHENSIVE METABOLIC PANEL - Abnormal; Notable for the following:    BUN 26 (*)    Creatinine, Ser 1.08 (*)    GFR calc non Af Amer 52 (*)    GFR calc Af Amer 60 (*)    All other components within normal limits    Imaging Review No results found.   Visual Acuity Review  Right Eye Distance:   Left Eye Distance:   Bilateral Distance:    Right Eye Near:   Left Eye Near:    Bilateral Near:         MDM   1. Dyspepsia   2. Epigastric pain    Discharge Medication List as of 09/14/2015  4:53 PM    START taking these medications   Details  HYDROcodone-acetaminophen (NORCO/VICODIN) 5-325 MG tablet 1 tab po q 12 hours prn, Print       1. Lab results and diagnosis reviewed with patient; patient given zofran 8mg  odt x1 for nausea and pepcid 20mg  po x1 with improvement of symptoms  2. rx as per orders above; reviewed possible side effects, interactions, risks and benefits  3. Recommend supportive treatment with otc H2 blocker or PPI 4. Recommend follow-up with GI  for further evaluation (possible upper endoscopy) or prn if symptoms worsen or don't improve    Payton Mccallumrlando Hanya Guerin, MD 09/22/15 2226

## 2015-09-14 NOTE — ED Notes (Signed)
As per pt has abdominal pain onset yesterday no nausea or diarrhea, pt has lower back  Pain urgency to use bathroom.

## 2015-09-14 NOTE — ED Notes (Signed)
Pt states that she has been having abd pain for the past 4 days, pt states that in the past hour and half her pain became much worse the patient states that her sister has been calling her and cussing her out and causing her a lot of stress. Pt was [facility] at [facility] for same pain today, pt denied it, husband states that it is so, pt states it was yesterday, pt is very jumpy, eradic in her behavior at times

## 2015-09-16 ENCOUNTER — Ambulatory Visit
Admission: EM | Admit: 2015-09-16 | Discharge: 2015-09-16 | Disposition: A | Discharge status: Home / Self Care | Payer: Commercial Managed Care - HMO | Attending: Family Medicine | Admitting: Family Medicine

## 2015-09-16 DIAGNOSIS — R1013 Epigastric pain: Secondary | ICD-10-CM | POA: Diagnosis not present

## 2015-09-16 NOTE — ED Provider Notes (Signed)
[facility]  ____________________________________________  Time seen: Approximately 11:06 AM  I have reviewed the triage vital signs and the nursing notes.   HISTORY  Chief Complaint Abdominal Pain  HPI Deborah [name] is a 71 y.o. female presents with daughter at bedside for the complaints of continued abdominal pain. Patient has been seen several times in the last few days for the same complaint. Patient was seen on 09/11/15 in the emergency room with evaluation including CT scan that essentially was unremarkable except for gastritis. Patient then seen on 09/14/15 in urgent care as well as on 09/14/15 in the emergency room again. Patient presenting today for continued abdominal pain. Patient had an appointment with GI this morning but states over slept and missed her appointment. Patient states rescheduled her appointment to next Monday. patient presenting today for the complaints of request the pain medication as she is out of the pain medication given to her a few days ago.  Patient reports has had continued abdominal pain to the upper abdomen over the last 5 days. Patient states the pain is not constant but comes and goes without increasing severity. States today mild nausea but denies other accompanying symptoms . Patient reports continues to eat and drink well with a good appetite. Reports the last bowel movement was this morning and was normal. Denies any vomiting. Denies any abnormal stool, blood in stool, blood in toilet or blood with wiping. Denies chest pain or shortness breath, dizziness, weakness, dysuria or other complaints. Denies drug or alcohol use.   patient states initially that she has not been taking any medications for the same. In question if patient had been taking the Zantac and Zofran that she had received from the emergency room on 4/27, patient then states "I filled my prescriptions and of course of been taking them." States medications have not resolved her pain  complaints. Denies any worsening of her pain. States pain has continued at same baseline.    Past Medical History  Diagnosis Date  . Asthma   . Ulcer   . Heart murmur   . Cystitis   . Arthritis   . Depression   . Spastic colon   . IBS (irritable bowel syndrome)   . Osteoporosis   . Anxiety     Patient Active Problem List   Diagnosis Date Noted  . Anxiety as acute reaction to exceptional stress 07/23/2015  . Generalized anxiety disorder 07/23/2015  . Overdose 07/03/2015  . Mild dementia 05/29/2015  . 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 Depression, major, recurrent, moderate (HCC) 04/01/2015  . Bipolar 1 disorder, depressed, moderate (HCC)   . Clinical depression 12/24/2014  . H/O disease 09/08/2014  . History of migraine headaches 09/08/2014  . H/O Bell's palsy 09/08/2014  . H/O: HTN (hypertension) 09/08/2014  . Gastroesophageal reflux disease without esophagitis 09/08/2014  . CAFL (chronic airflow limitation) (HCC) 09/08/2014  . History of asthma 09/08/2014  . 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 Cholecystectomy    . Spine surgery    . Colonoscopy  2002  . Upper gi endoscopy  2011  . Arm surgery Left 2011    fracture repair  . Throat biopsy   2012  . Shoulder surgery    . Elbow surgery    . Breast biopsy      [facility]IS    Current Outpatient Rx  Name  Route  Sig  Dispense  Refill  . alendronate (FOSAMAX) 70 MG tablet   Oral   Take 1 tablet by mouth once a week.      5   . diazepam (VALIUM) 2 MG tablet   Oral   Take 1 tablet (2 mg total) by mouth every 12 (twelve) hours as needed for anxiety.   4 tablet   0   . divalproex (DEPAKOTE) 250 MG DR tablet   Oral   Take 250-500 tablets by mouth 2 (two) times daily. Take 250mg  twice daily for 3 days, then take 250mg  in the morning and 500mg  at bedtime for 3 days, then take 500mg  twice daily       5   . donepezil (ARICEPT) 5 MG tablet   Oral   Take 5 mg by mouth at bedtime.          Marland Kitchen escitalopram (LEXAPRO) 10 MG tablet   Oral   Take 1 tablet (10 mg total) by mouth daily.   30 tablet   3   . HYDROcodone-acetaminophen (NORCO/VICODIN) 5-325 MG tablet      1 tab po q 12 hours prn Patient taking differently: Take 1 tablet by mouth every 12 (twelve) hours as needed for severe pain.    5 tablet   0   . hydrOXYzine (ATARAX/VISTARIL) 25 MG tablet   Oral   Take 25 mg by mouth 3 (three) times daily as needed for anxiety.          . ondansetron (ZOFRAN ODT) 4 MG disintegrating tablet   Oral   Take 1 tablet (4 mg total) by mouth every 6 (six) hours as needed for nausea or vomiting.   20 tablet   0   . QUEtiapine (SEROQUEL) 50 MG tablet   Oral   Take 50-100 mg by mouth 2 (two) times daily. Take 1 tablet (50mg ) in the morning and 2 tablets (100mg ) at bedtime         . ranitidine (ZANTAC) 150 MG tablet   Oral   Take 1 tablet (150 mg total) by mouth at bedtime.   30 tablet   0   . traZODone (DESYREL) 100 MG tablet   Oral   Take 100 mg by mouth at bedtime.           Allergies Dristan cold and Prednisone  Family History  Problem Relation Age of Onset  . Cancer Sister     ovarian  . Cancer Maternal Aunt     breast  . Cancer Maternal Aunt     breast  . Heart attack Mother     Social History Social History  Substance Use Topics  . Smoking status: Current Every Day Smoker -- 1.00 packs/day for 20 years    Types: Cigarettes    Start date: 03/31/1977  . Smokeless tobacco: Never Used  . Alcohol Use: No     Comment: Ocassional    Review of Systems Constitutional: No fever/chills Eyes: No visual changes. ENT: No sore throat. Cardiovascular: Denies chest pain. Respiratory: Denies shortness of breath. Gastrointestinal: As above. no vomiting.  No diarrhea.  No constipation. Genitourinary: Negative for dysuria. Musculoskeletal: Negative for back  pain. Skin: Negative for rash. Neurological: Negative for headaches, focal weakness or numbness.  10-point ROS otherwise negative.  ____________________________________________   PHYSICAL EXAM:  VITAL SIGNS: ED Triage Vitals  Enc Vitals Group     BP 09/16/15 1016 118/70 mmHg     Pulse Rate 09/16/15 1016 76     Resp  09/16/15 1016 20     Temp 09/16/15 1016 97.4 F (36.3 C)     Temp Source 09/16/15 1016 Tympanic     SpO2 09/16/15 1016 98 %     Weight 09/16/15 1016 113 lb (51.256 kg)     Height 09/16/15 1016 5\' 2"  (1.575 m)     Head Cir --      Peak Flow --      Pain Score --      Pain Loc --      Pain Edu? --      Excl. in GC? --     Constitutional: Alert and oriented. Well appearing and in no acute distress. Eyes: Conjunctivae are normal. PERRL. EOMI. Head: Atraumatic.   Nose: No congestion/rhinnorhea.  Mouth/Throat: Mucous membranes are moist.  Oropharynx non-erythematous. Neck: No stridor.  No cervical spine tenderness to palpation. Hematological/Lymphatic/Immunilogical: No cervical lymphadenopathy. Cardiovascular: Normal rate, regular rhythm. Grossly normal heart sounds.  Good peripheral circulation. Respiratory: Normal respiratory effort.  No retractions. Lungs CTAB.No wheezes, rales or rhonchi.  Gastrointestinal: Soft. No distention. moderate epigastric and mild left lower quadrant abdominal pain. Patient goes from lying to sitting position without accessory use quickly and easily without discomfort noted. Normal Bowel sounds.No CVA tenderness. Musculoskeletal: No lower or upper extremity tenderness nor edema.   No cervical, thoracic or lumbar tenderness to palpation.  Neurologic:  Normal speech and language. No gross focal neurologic deficits are appreciated. No gait instability. Skin:  Skin is warm, dry and intact. No rash noted. Psychiatric: Mood and affect are normal. Speech and behavior are normal.  ____________________________________________    LABS (all  labs ordered are listed, but only abnormal results are displayed)  Labs Reviewed - No data to display  REFUSED labs  CT from 09/11/15 from ER obtained via Epic. CLINICAL DATA: Left-sided abdominal pain for 2 days  EXAM: CT ABDOMEN AND PELVIS WITH CONTRAST  TECHNIQUE: Multidetector CT imaging of the abdomen and pelvis was performed using the standard protocol following bolus administration of intravenous contrast.  CONTRAST: 75mL ISOVUE-300 IOPAMIDOL (ISOVUE-300) INJECTION 61%  COMPARISON: None.  FINDINGS: Lung bases are free of acute infiltrate or sizable effusion.  Postsurgical changes are noted adjacent to the stomach. Stomach is somewhat hyperemic with wall thickening. This may represent some underlying gastritis.  The gallbladder has been surgically removed. The liver, spleen, adrenal glands and pancreas are within normal limits. The kidneys are well visualized bilaterally. No renal calculi or obstructive changes are noted. Normal excretion of contrast is noted on delayed images.  Aortoiliac calcifications are seen without aneurysmal dilatation. The bladder is well distended. The uterus and ovaries are within normal limits.  The appendix is well visualized and within normal limits. No significant diverticular change of the colon is noted. No free pelvic fluid is seen. The bony structures show postoperative change at L5-S1 on the left. Degenerative changes of the lumbar spine are seen.  IMPRESSION: Postsurgical changes.  Wall thickening and hyperemia within the stomach likely related to a component of gastritis.  No other focal abnormality is seen.   Electronically Signed  By: Alcide CleverMark Lukens M.D.  On: 09/11/2015 08:27  INITIAL IMPRESSION / ASSESSMENT AND PLAN / ED COURSE  Pertinent labs & imaging results that were available during my care of the patient were reviewed by me and considered in my medical decision making (see chart for  details).  Labs and CT report reviewed from recent visits via Epic, see CT report from 09/11/2015 above.   overall very  well-appearing patient. Daughter at bedside. Presents for the complaints of continued epigastric abdominal pain. Patient with moderate epigastric and mild left lower quadrant abdominal pain. Patient goes from lying to sitting position without accessory use quickly and easily without discomfort noted. Patient drinking coffee in room. Patient requests pain medication at this time. In discussion with patient and daughter regarding patient need to have further evaluation or follow-up. Offered patient to evaluate labs to ensure no acute changes comparatively to Sundays, patient refused and states that that would "do no good." Offered GI cocktail as patient expresses concern that she feels that she has an ulcer. Patient refused and states that she cannot drink area however patient reports continued to eat and drink well at home. Patient states GI cocktail she's had before and makes her nauseated and states that she will not take it. Discussed with patient as patient states that she is continually having pain she needs to have further evaluation and follow-up to evaluate for the source of her pain. Patient requests a prescription for pain medication. Discussed with patient will not give further narcotic pain medication at this time without further evaluation as unclear source and do not want to cause further harm. Patient states "this is a waste of time and I am going to leave ". Patient and her daughter then left the exam room. Patient stable at the time of leaving urgent care.  Discussed follow up with Primary care physician this week. Discussed follow up and return parameters including no resolution or any worsening concerns. Patient verbalized understanding and agreed to plan.   ____________________________________________   FINAL CLINICAL IMPRESSION(S) / ED DIAGNOSES  Final diagnoses:   Epigastric pain      Note: This dictation was prepared with Dragon dictation along with smaller phrase technology. Any transcriptional errors that result from this process are unintentional.    Renford Dills, NP 09/22/15 2220

## 2015-09-16 NOTE — Discharge Instructions (Signed)
Rest. Drink plenty of fluids.   Follow up with your primary care physician this week as needed. Return to Urgent care or to ER for new or worsening concerns.     Abdominal Pain, Adult Many things can cause abdominal pain. Usually, abdominal pain is not caused by a disease and will improve without treatment. It can often be observed and treated at home. Your health care provider will do a physical exam and possibly order blood tests and X-rays to help determine the seriousness of your pain. However, in many cases, more time must pass before a clear cause of the pain can be found. Before that point, your health care provider may not know if you need more testing or further treatment. HOME CARE INSTRUCTIONS Monitor your abdominal pain for any changes. The following actions may help to alleviate any discomfort you are experiencing:  Only take over-the-counter or prescription medicines as directed by your health care provider.  Do not take laxatives unless directed to do so by your health care provider.  Try a clear liquid diet (broth, tea, or water) as directed by your health care provider. Slowly move to a bland diet as tolerated. SEEK MEDICAL CARE IF:  You have unexplained abdominal pain.  You have abdominal pain associated with nausea or diarrhea.  You have pain when you urinate or have a bowel movement.  You experience abdominal pain that wakes you in the night.  You have abdominal pain that is worsened or improved by eating food.  You have abdominal pain that is worsened with eating fatty foods.  You have a fever. SEEK IMMEDIATE MEDICAL CARE IF:  Your pain does not go away within 2 hours.  You keep throwing up (vomiting).  Your pain is felt only in portions of the abdomen, such as the right side or the left lower portion of the abdomen.  You pass bloody or black tarry stools. MAKE SURE YOU:  Understand these instructions.  Will watch your condition.  Will get help right  away if you are not doing well or get worse.   This information is not intended to replace advice given to you by your health care provider. Make sure you discuss any questions you have with your health care provider.   Document Released: 02/10/2005 Document Revised: 01/22/2015 Document Reviewed: 01/10/2013 Elsevier Interactive Patient Education [employer]2016 [employer]

## 2015-09-17 ENCOUNTER — Encounter: Payer: Self-pay | Admitting: *Deleted

## 2015-09-17 ENCOUNTER — Emergency Department: Payer: Commercial Managed Care - HMO

## 2015-09-17 ENCOUNTER — Ambulatory Visit (HOSPITAL_COMMUNITY): Payer: Self-pay | Admitting: Psychiatry

## 2015-09-17 ENCOUNTER — Ambulatory Visit (INDEPENDENT_AMBULATORY_CARE_PROVIDER_SITE_OTHER): Payer: Commercial Managed Care - HMO

## 2015-09-17 ENCOUNTER — Emergency Department
Admission: EM | Admit: 2015-09-17 | Discharge: 2015-09-17 | Disposition: A | Discharge status: Home / Self Care | Payer: Commercial Managed Care - HMO | Attending: Emergency Medicine | Admitting: Emergency Medicine

## 2015-09-17 ENCOUNTER — Ambulatory Visit
Admission: EM | Admit: 2015-09-17 | Discharge: 2015-09-17 | Disposition: A | Discharge status: Home / Self Care | Payer: Commercial Managed Care - HMO | Attending: Family Medicine | Admitting: Family Medicine

## 2015-09-17 DIAGNOSIS — F419 Anxiety disorder, unspecified: Secondary | ICD-10-CM | POA: Diagnosis not present

## 2015-09-17 DIAGNOSIS — M25572 Pain in left ankle and joints of left foot: Secondary | ICD-10-CM | POA: Diagnosis not present

## 2015-09-17 DIAGNOSIS — M545 Low back pain, unspecified: Secondary | ICD-10-CM

## 2015-09-17 DIAGNOSIS — F3132 Bipolar disorder, current episode depressed, moderate: Secondary | ICD-10-CM | POA: Insufficient documentation

## 2015-09-17 DIAGNOSIS — I10 Essential (primary) hypertension: Secondary | ICD-10-CM | POA: Insufficient documentation

## 2015-09-17 DIAGNOSIS — Y999 Unspecified external cause status: Secondary | ICD-10-CM | POA: Insufficient documentation

## 2015-09-17 DIAGNOSIS — F4310 Post-traumatic stress disorder, unspecified: Secondary | ICD-10-CM | POA: Diagnosis not present

## 2015-09-17 DIAGNOSIS — S93402A Sprain of unspecified ligament of left ankle, initial encounter: Secondary | ICD-10-CM | POA: Insufficient documentation

## 2015-09-17 DIAGNOSIS — S9002XA Contusion of left ankle, initial encounter: Secondary | ICD-10-CM | POA: Diagnosis not present

## 2015-09-17 DIAGNOSIS — M1990 Unspecified osteoarthritis, unspecified site: Secondary | ICD-10-CM | POA: Insufficient documentation

## 2015-09-17 DIAGNOSIS — M810 Age-related osteoporosis without current pathological fracture: Secondary | ICD-10-CM | POA: Diagnosis not present

## 2015-09-17 DIAGNOSIS — F17210 Nicotine dependence, cigarettes, uncomplicated: Secondary | ICD-10-CM | POA: Diagnosis not present

## 2015-09-17 DIAGNOSIS — Y939 Activity, unspecified: Secondary | ICD-10-CM | POA: Diagnosis not present

## 2015-09-17 DIAGNOSIS — J45909 Unspecified asthma, uncomplicated: Secondary | ICD-10-CM | POA: Insufficient documentation

## 2015-09-17 DIAGNOSIS — M549 Dorsalgia, unspecified: Secondary | ICD-10-CM | POA: Diagnosis not present

## 2015-09-17 DIAGNOSIS — Y929 Unspecified place or not applicable: Secondary | ICD-10-CM | POA: Insufficient documentation

## 2015-09-17 DIAGNOSIS — M5489 Other dorsalgia: Secondary | ICD-10-CM | POA: Diagnosis not present

## 2015-09-17 DIAGNOSIS — Z79899 Other long term (current) drug therapy: Secondary | ICD-10-CM | POA: Diagnosis not present

## 2015-09-17 DIAGNOSIS — S99912A Unspecified injury of left ankle, initial encounter: Secondary | ICD-10-CM | POA: Diagnosis not present

## 2015-09-17 DIAGNOSIS — S3992XA Unspecified injury of lower back, initial encounter: Secondary | ICD-10-CM | POA: Diagnosis not present

## 2015-09-17 DIAGNOSIS — S299XXA Unspecified injury of thorax, initial encounter: Secondary | ICD-10-CM | POA: Diagnosis not present

## 2015-09-17 MED ORDER — HYDROCODONE-ACETAMINOPHEN 5-325 MG PO TABS
1.0000 | ORAL_TABLET | Freq: Once | ORAL | Status: AC
  Administered 2015-09-17: 1 via ORAL
  Filled 2015-09-17: qty 1

## 2015-09-17 MED ORDER — DIAZEPAM 2 MG PO TABS: 2.0000 mg | ORAL_TABLET | Freq: Two times a day (BID) | ORAL | Status: DC | PRN

## 2015-09-17 MED ORDER — KETOROLAC TROMETHAMINE 60 MG/2ML IM SOLN
30.0000 mg | Freq: Once | INTRAMUSCULAR | Status: AC
  Administered 2015-09-17: 30 mg via INTRAMUSCULAR

## 2015-09-17 MED ORDER — DIAZEPAM 2 MG PO TABS
2.0000 mg | ORAL_TABLET | Freq: Once | ORAL | Status: AC
  Administered 2015-09-17: 2 mg via ORAL
  Filled 2015-09-17: qty 1

## 2015-09-17 NOTE — ED Notes (Signed)
[place] [employer] notified and they advised they were aware and involved.
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
## 2015-09-17 NOTE — Discharge Instructions (Signed)
Ankle Sprain An ankle sprain is an injury to the strong, fibrous tissues (ligaments) that hold your ankle bones together.  HOME CARE   Put ice on your ankle for 1-2 days or as told by your doctor.  Put ice in a plastic bag.  Place a towel between your skin and the bag.  Leave the ice on for 15-20 minutes at a time, every 2 hours while you are awake.  Only take medicine as told by your doctor.  Raise (elevate) your injured ankle above the level of your heart as much as possible for 2-3 days.  Use crutches if your doctor tells you to. Slowly put your own weight on the affected ankle. Use the crutches until you can walk without pain.  If you have a plaster splint:  Do not rest it on anything harder than a pillow for 24 hours.  Do not put weight on it.  Do not get it wet.  Take it off to shower or bathe.  If given, use an elastic wrap or support stocking for support. Take the wrap off if your toes lose feeling (numb), tingle, or turn cold or blue.  If you have an air splint:  Add or let out air to make it comfortable.  Take it off at night and to shower and bathe.  Wiggle your toes and move your ankle up and down often while you are wearing it. GET HELP IF:  You have rapidly increasing bruising or puffiness (swelling).  Your toes feel very cold.  You lose feeling in your foot.  Your medicine does not help your pain. GET HELP RIGHT AWAY IF:   Your toes lose feeling (numb) or turn blue.  You have severe pain that is increasing. MAKE SURE YOU:   Understand these instructions.  Will watch your condition.  Will get help right away if you are not doing well or get worse.   This information is not intended to replace advice given to you by your health care provider. Make sure you discuss any questions you have with your health care provider.   Document Released: 10/20/2007 Document Revised: 05/24/2014 Document Reviewed: 11/15/2011 Elsevier Interactive Patient  Education 2016 [employer]  Back Pain, Adult Back pain is very common. The pain often gets better over time. The cause of back pain is usually not dangerous. Most people can learn to manage their back pain on their own.  HOME CARE  Watch your back pain for any changes. The following actions may help to lessen any pain you are feeling:  Stay active. Start with short walks on flat ground if you can. Try to walk farther each day.  Exercise regularly as told by your doctor. Exercise helps your back heal faster. It also helps avoid future injury by keeping your muscles strong and flexible.  Do not sit, drive, or stand in one place for more than 30 minutes.  Do not stay in bed. Resting more than 1-2 days can slow down your recovery.  Be careful when you bend or lift an object. Use good form when lifting:  Bend at your knees.  Keep the object close to your body.  Do not twist.  Sleep on a firm mattress. Lie on your side, and bend your knees. If you lie on your back, put a pillow under your knees.  Take medicines only as told by your doctor.  Put ice on the injured area.  Put ice in a plastic bag.  Place a towel  between your skin and the bag.  Leave the ice on for 20 minutes, 2-3 times a day for the first 2-3 days. After that, you can switch between ice and heat packs.  Avoid feeling anxious or stressed. Find good ways to deal with stress, such as exercise.  Maintain a healthy weight. Extra weight puts stress on your back. GET HELP IF:   You have pain that does not go away with rest or medicine.  You have worsening pain that goes down into your legs or buttocks.  You have pain that does not get better in one week.  You have pain at night.  You lose weight.  You have a fever or chills. GET HELP RIGHT AWAY IF:   You cannot control when you poop (bowel movement) or pee (urinate).  Your arms or legs feel weak.  Your arms or legs lose feeling (numbness).  You feel  sick to your stomach (nauseous) or throw up (vomit).  You have belly (abdominal) pain.  You feel like you may pass out (faint).   This information is not intended to replace advice given to you by your health care provider. Make sure you discuss any questions you have with your health care provider.   Document Released: 10/20/2007 Document Revised: 05/24/2014 Document Reviewed: 09/04/2013 Elsevier Interactive Patient Education 2016 [employer]  Contusion A contusion is a deep bruise. Contusions are the result of a blunt injury to tissues and muscle fibers under the skin. The injury causes bleeding under the skin. The skin overlying the contusion may turn blue, purple, or yellow. Minor injuries will give you a painless contusion, but more severe contusions may stay painful and swollen for a few weeks.  CAUSES  This condition is usually caused by a blow, trauma, or direct force to an area of the body. SYMPTOMS  Symptoms of this condition include:  Swelling of the injured area.  Pain and tenderness in the injured area.  Discoloration. The area may have redness and then turn blue, purple, or yellow. DIAGNOSIS  This condition is diagnosed based on a physical exam and medical history. An X-ray, CT scan, or MRI may be needed to determine if there are any associated injuries, such as broken bones (fractures). TREATMENT  Specific treatment for this condition depends on what area of the body was injured. In general, the best treatment for a contusion is resting, icing, applying pressure to (compression), and elevating the injured area. This is often called the RICE strategy. Over-the-counter anti-inflammatory medicines may also be recommended for pain control.  HOME CARE INSTRUCTIONS   Rest the injured area.  If directed, apply ice to the injured area:  Put ice in a plastic bag.  Place a towel between your skin and the bag.  Leave the ice on for 20 minutes, 2-3 times per day.  If  directed, apply light compression to the injured area using an elastic bandage. Make sure the bandage is not wrapped too tightly. Remove and reapply the bandage as directed by your health care provider.  If possible, raise (elevate) the injured area above the level of your heart while you are sitting or lying down.  Take over-the-counter and prescription medicines only as told by your health care provider. SEEK MEDICAL CARE IF:  Your symptoms do not improve after several days of treatment.  Your symptoms get worse.  You have difficulty moving the injured area. SEEK IMMEDIATE MEDICAL CARE IF:   You have severe pain.  You have numbness  in a hand or foot.  Your hand or foot turns pale or cold.   This information is not intended to replace advice given to you by your health care provider. Make sure you discuss any questions you have with your health care provider.   Document Released: 02/10/2005 Document Revised: 01/22/2015 Document Reviewed: 09/18/2014 Elsevier Interactive Patient Education 2016 [employer]  Cryotherapy Cryotherapy is when you put ice on your injury. Ice helps lessen pain and puffiness (swelling) after an injury. Ice works the best when you start using it in the first 24 to 48 hours after an injury. HOME CARE  Put a dry or damp towel between the ice pack and your skin.  You may press gently on the ice pack.  Leave the ice on for no more than 10 to 20 minutes at a time.  Check your skin after 5 minutes to make sure your skin is okay.  Rest at least 20 minutes between ice pack uses.  Stop using ice when your skin loses feeling (numbness).  Do not use ice on someone who cannot tell you when it hurts. This includes small children and people with memory problems (dementia). GET HELP RIGHT AWAY IF:  You have white spots on your skin.  Your skin turns blue or pale.  Your skin feels waxy or hard.  Your puffiness gets worse. MAKE SURE YOU:   Understand  these instructions.  Will watch your condition.  Will get help right away if you are not doing well or get worse.   This information is not intended to replace advice given to you by your health care provider. Make sure you discuss any questions you have with your health care provider.   Document Released: 10/20/2007 Document Revised: 07/26/2011 Document Reviewed: 12/24/2010 Elsevier Interactive Patient Education 2016 [employer]  Musculoskeletal Pain Musculoskeletal pain is muscle and boney aches and pains. These pains can occur in any part of the body. Your caregiver may treat you without knowing the cause of the pain. They may treat you if blood or urine tests, X-rays, and other tests were normal.  CAUSES There is often not a definite cause or reason for these pains. These pains may be caused by a type of germ (virus). The discomfort may also come from overuse. Overuse includes working out too hard when your body is not fit. Boney aches also come from weather changes. Bone is sensitive to atmospheric pressure changes. HOME CARE INSTRUCTIONS   Ask when your test results will be ready. Make sure you get your test results.  Only take over-the-counter or prescription medicines for pain, discomfort, or fever as directed by your caregiver. If you were given medications for your condition, do not drive, operate machinery or power tools, or sign legal documents for 24 hours. Do not drink alcohol. Do not take sleeping pills or other medications that may interfere with treatment.  Continue all activities unless the activities cause more pain. When the pain lessens, slowly resume normal activities. Gradually increase the intensity and duration of the activities or exercise.  During periods of severe pain, bed rest may be helpful. Lay or sit in any position that is comfortable.  Putting ice on the injured area.  Put ice in a bag.  Place a towel between your skin and the bag.  Leave the ice  on for 15 to 20 minutes, 3 to 4 times a day.  Follow up with your caregiver for continued problems and no reason can be found  for the pain. If the pain becomes worse or does not go away, it may be necessary to repeat tests or do additional testing. Your caregiver may need to look further for a possible cause. SEEK IMMEDIATE MEDICAL CARE IF:  You have pain that is getting worse and is not relieved by medications.  You develop chest pain that is associated with shortness or breath, sweating, feeling sick to your stomach (nauseous), or throw up (vomit).  Your pain becomes localized to the abdomen.  You develop any new symptoms that seem different or that concern you. MAKE SURE YOU:   Understand these instructions.  Will watch your condition.  Will get help right away if you are not doing well or get worse.   This information is not intended to replace advice given to you by your health care provider. Make sure you discuss any questions you have with your health care provider.   Document Released: 05/03/2005 Document Revised: 07/26/2011 Document Reviewed: 01/05/2013 Elsevier Interactive Patient Education 2016 [employer].   Panic Attacks Panic attacks are sudden, short feelings of great fear or discomfort. You may have them for no reason when you are relaxed, when you are uneasy (anxious), or when you are sleeping.  HOME CARE  Take all your medicines as told.  Check with your doctor before starting new medicines.  Keep all doctor visits. GET HELP IF:  You are not able to take your medicines as told.  Your symptoms do not get better.  Your symptoms get worse. GET HELP RIGHT AWAY IF:  Your attacks seem different than your normal attacks.  You have thoughts about hurting yourself or others.  You take panic attack medicine and you have a side effect. MAKE SURE YOU:  Understand these instructions.  Will watch your condition.  Will get help right away if you are not  doing well or get worse.   This information is not intended to replace advice given to you by your health care provider. Make sure you discuss any questions you have with your health care provider.   Document Released: 06/05/2010 Document Revised: 02/21/2013 Document Reviewed: 12/15/2012 Elsevier Interactive Patient Education [employer]
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
## 2015-09-17 NOTE — ED Notes (Signed)
See triage   [facility] and sent here for further eval  Also having pain to left foot  Swelling and bruising noted
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
## 2015-09-19 ENCOUNTER — Emergency Department
Admission: EM | Admit: 2015-09-19 | Discharge: 2015-09-20 | Disposition: A | Discharge status: Home / Self Care | Payer: Commercial Managed Care - HMO | Attending: Emergency Medicine | Admitting: Emergency Medicine

## 2015-09-19 ENCOUNTER — Encounter: Payer: Self-pay | Admitting: Emergency Medicine

## 2015-09-19 DIAGNOSIS — F3132 Bipolar disorder, current episode depressed, moderate: Secondary | ICD-10-CM | POA: Diagnosis not present

## 2015-09-19 DIAGNOSIS — Z79899 Other long term (current) drug therapy: Secondary | ICD-10-CM | POA: Diagnosis not present

## 2015-09-19 DIAGNOSIS — F419 Anxiety disorder, unspecified: Secondary | ICD-10-CM

## 2015-09-19 DIAGNOSIS — Z853 Personal history of malignant neoplasm of breast: Secondary | ICD-10-CM | POA: Diagnosis not present

## 2015-09-19 DIAGNOSIS — F17210 Nicotine dependence, cigarettes, uncomplicated: Secondary | ICD-10-CM | POA: Diagnosis not present

## 2015-09-19 DIAGNOSIS — I10 Essential (primary) hypertension: Secondary | ICD-10-CM | POA: Insufficient documentation

## 2015-09-19 DIAGNOSIS — M1990 Unspecified osteoarthritis, unspecified site: Secondary | ICD-10-CM | POA: Diagnosis not present

## 2015-09-19 DIAGNOSIS — J45909 Unspecified asthma, uncomplicated: Secondary | ICD-10-CM | POA: Insufficient documentation

## 2015-09-19 NOTE — ED Notes (Signed)
Pt states "i need a shot for my nerves and then i can go home." ems states was called for "depression and panic attack". Pt denies HI and SI. Pt anxious states "i had a full blown attack tonight, i was panicking."

## 2015-09-20 MED ORDER — LORAZEPAM 2 MG/ML IJ SOLN
1.0000 mg | Freq: Once | INTRAMUSCULAR | Status: AC
  Administered 2015-09-20: 1 mg via INTRAMUSCULAR
  Filled 2015-09-20: qty 1

## 2015-09-20 NOTE — ED Notes (Signed)
Pt calmer, talking on telephone in no acute distress.
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
## 2015-09-20 NOTE — Discharge Instructions (Signed)
Please seek medical attention and help for any thoughts about wanting to harm herself, harm others, any concerning change in behavior, severe depression, inappropriate drug use or any other new or concerning symptoms. ° °

## 2015-09-22 ENCOUNTER — Emergency Department
Admission: EM | Admit: 2015-09-22 | Discharge: 2015-09-22 | Disposition: A | Discharge status: Home / Self Care | Payer: Commercial Managed Care - HMO | Attending: Emergency Medicine | Admitting: Emergency Medicine

## 2015-09-22 ENCOUNTER — Encounter: Payer: Self-pay | Admitting: Emergency Medicine

## 2015-09-22 DIAGNOSIS — M1990 Unspecified osteoarthritis, unspecified site: Secondary | ICD-10-CM | POA: Diagnosis not present

## 2015-09-22 DIAGNOSIS — J45909 Unspecified asthma, uncomplicated: Secondary | ICD-10-CM | POA: Insufficient documentation

## 2015-09-22 DIAGNOSIS — R45851 Suicidal ideations: Secondary | ICD-10-CM | POA: Insufficient documentation

## 2015-09-22 DIAGNOSIS — I10 Essential (primary) hypertension: Secondary | ICD-10-CM | POA: Diagnosis not present

## 2015-09-22 DIAGNOSIS — Z79899 Other long term (current) drug therapy: Secondary | ICD-10-CM | POA: Insufficient documentation

## 2015-09-22 DIAGNOSIS — F419 Anxiety disorder, unspecified: Secondary | ICD-10-CM

## 2015-09-22 DIAGNOSIS — F17210 Nicotine dependence, cigarettes, uncomplicated: Secondary | ICD-10-CM | POA: Insufficient documentation

## 2015-09-22 DIAGNOSIS — F319 Bipolar disorder, unspecified: Secondary | ICD-10-CM | POA: Insufficient documentation

## 2015-09-22 DIAGNOSIS — G8929 Other chronic pain: Secondary | ICD-10-CM | POA: Diagnosis not present

## 2015-09-22 LAB — COMPREHENSIVE METABOLIC PANEL
ALT: 17 U/L (ref 14–54)
AST: 24 U/L (ref 15–41)
Albumin: 4.6 g/dL (ref 3.5–5.0)
Alkaline Phosphatase: 46 U/L (ref 38–126)
Anion gap: 7 (ref 5–15)
BILIRUBIN TOTAL: 0.6 mg/dL (ref 0.3–1.2)
BUN: 26 mg/dL — AB (ref 6–20)
CALCIUM: 9.3 mg/dL (ref 8.9–10.3)
CO2: 28 mmol/L (ref 22–32)
CREATININE: 1.21 mg/dL — AB (ref 0.44–1.00)
Chloride: 105 mmol/L (ref 101–111)
GFR calc Af Amer: 52 mL/min — ABNORMAL LOW (ref >60)
GFR, EST NON AFRICAN AMERICAN: 45 mL/min — AB (ref >60)
Glucose, Bld: 105 mg/dL — ABNORMAL HIGH (ref 65–99)
POTASSIUM: 3.6 mmol/L (ref 3.5–5.1)
Sodium: 140 mmol/L (ref 135–145)
TOTAL PROTEIN: 7.9 g/dL (ref 6.5–8.1)

## 2015-09-22 LAB — CBC WITH DIFFERENTIAL/PLATELET
BASOS ABS: 0 10*3/uL (ref 0–0.1)
BASOS PCT: 1 %
EOS ABS: 0.1 10*3/uL (ref 0–0.7)
EOS PCT: 2 %
HCT: 42.6 % (ref 35.0–47.0)
Hemoglobin: 13.9 g/dL (ref 12.0–16.0)
Lymphocytes Relative: 38 %
Lymphs Abs: 2.3 10*3/uL (ref 1.0–3.6)
MCH: 32.1 pg (ref 26.0–34.0)
MCHC: 32.7 g/dL (ref 32.0–36.0)
MCV: 98 fL (ref 80.0–100.0)
MONO ABS: 0.4 10*3/uL (ref 0.2–0.9)
Monocytes Relative: 6 %
Neutro Abs: 3.2 10*3/uL (ref 1.4–6.5)
Neutrophils Relative %: 53 %
PLATELETS: 129 10*3/uL — AB (ref 150–440)
RBC: 4.35 MIL/uL (ref 3.80–5.20)
RDW: 15.1 % — AB (ref 11.5–14.5)
WBC: 6.1 10*3/uL (ref 3.6–11.0)

## 2015-09-22 LAB — ETHANOL

## 2015-09-22 LAB — SALICYLATE LEVEL: Salicylate Lvl: <4 mg/dL (ref 2.8–30.0)

## 2015-09-22 LAB — ACETAMINOPHEN LEVEL
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
## 2015-09-22 NOTE — Discharge Instructions (Signed)
You have been seen in the Emergency Department (ED) today for a psychiatric complaint.    Please return to the ED immediately if you have ANY thoughts of hurting yourself or anyone else, so that we may help you.  Please avoid alcohol and drug use.  Follow up with your doctor and/or therapist as soon as possible regarding today's ED visit.   Please follow up with "[facility]" as noted and as listed on the referral sheet you were given today.   Generalized Anxiety Disorder Generalized anxiety disorder (GAD) is a mental disorder. It interferes with life functions, including relationships, work, and school. GAD is different from normal anxiety, which everyone experiences at some point in their lives in response to specific life events and activities. Normal anxiety actually helps us prepare for and get through these life events and activities. Normal anxiety goes away after the event or activity is over.  GAD causes anxiety that is not necessarily related to specific events or activities. It also causes excess anxiety in proportion to specific events or activities. The anxiety associated with GAD is also difficult to control. GAD can vary from mild to severe. People with severe GAD can have intense waves of anxiety with physical symptoms (panic attacks).  SYMPTOMS The anxiety and worry associated with GAD are difficult to control. This anxiety and worry are related to many life events and activities and also occur more days than not for 6 months or longer. People with GAD also have three or more of the following symptoms (one or more in children):  Restlessness.   Fatigue.  Difficulty concentrating.   Irritability.  Muscle tension.  Difficulty sleeping or unsatisfying sleep. DIAGNOSIS GAD is diagnosed through an assessment by your health care provider. Your health care provider will ask you questions aboutyour mood,physical symptoms, and events in your life. Your health care provider may ask you  about your medical history and use of alcohol or drugs, including prescription medicines. Your health care provider may also do a physical exam and blood tests. Certain medical conditions and the use of certain substances can cause symptoms similar to those associated with GAD. Your health care provider may refer you to a mental health specialist for further evaluation. TREATMENT The following therapies are usually used to treat GAD:   Medication. Antidepressant medication usually is prescribed for long-term daily control. Antianxiety medicines may be added in severe cases, especially when panic attacks occur.   Talk therapy (psychotherapy). Certain types of talk therapy can be helpful in treating GAD by providing support, education, and guidance. A form of talk therapy called cognitive behavioral therapy can teach you healthy ways to think about and react to daily life events and activities.  Stress managementtechniques. These include yoga, meditation, and exercise and can be very helpful when they are practiced regularly. A mental health specialist can help determine which treatment is best for you. Some people see improvement with one therapy. However, other people require a combination of therapies.   This information is not intended to replace advice given to you by your health care provider. Make sure you discuss any questions you have with your health care provider.   Document Released: 08/28/2012 Document Revised: 05/24/2014 Document Reviewed: 08/28/2012 Elsevier Interactive Patient Education [employer]2016 [employer] Inc.

## 2015-09-22 NOTE — ED Notes (Signed)

## 2015-09-22 NOTE — ED Notes (Signed)
Meal tray given to pt. Pt constantly standing at doorway very anxious and jumpy every time someone talks to pt or is near pt.
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
## 2015-09-23 DIAGNOSIS — F410 Panic disorder [episodic paroxysmal anxiety] without agoraphobia: Secondary | ICD-10-CM | POA: Diagnosis not present

## 2015-09-23 DIAGNOSIS — F332 Major depressive disorder, recurrent severe without psychotic features: Secondary | ICD-10-CM | POA: Diagnosis not present

## 2015-09-23 DIAGNOSIS — F0391 Unspecified dementia with behavioral disturbance: Secondary | ICD-10-CM | POA: Diagnosis not present

## 2015-09-24 ENCOUNTER — Ambulatory Visit (INDEPENDENT_AMBULATORY_CARE_PROVIDER_SITE_OTHER): Payer: Commercial Managed Care - HMO | Admitting: Psychiatry

## 2015-09-24 VITALS — BP 132/80 | HR 82 | Ht 62.0 in | Wt 122.6 lb

## 2015-09-24 DIAGNOSIS — F339 Major depressive disorder, recurrent, unspecified: Secondary | ICD-10-CM

## 2015-09-24 MED ORDER — MIRTAZAPINE 30 MG PO TABS: 30.0000 mg | ORAL_TABLET | Freq: Every day | ORAL | Status: DC

## 2015-09-24 MED ORDER — QUETIAPINE FUMARATE 50 MG PO TABS: 50.0000 mg | ORAL_TABLET | Freq: Two times a day (BID) | ORAL | Status: DC

## 2015-09-24 MED ORDER — GABAPENTIN 100 MG PO CAPS: ORAL_CAPSULE | ORAL | Status: DC
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
## 2015-09-25 ENCOUNTER — Inpatient Hospital Stay
Admission: RE | Admit: 2015-09-25 | Discharge: 2015-09-26 | DRG: 882 | Disposition: A | Discharge status: Home / Self Care | Payer: Commercial Managed Care - HMO | Source: Intra-hospital | Attending: Psychiatry | Admitting: Psychiatry

## 2015-09-25 ENCOUNTER — Encounter: Payer: Self-pay | Admitting: Psychiatry

## 2015-09-25 ENCOUNTER — Emergency Department (EMERGENCY_DEPARTMENT_HOSPITAL)
Admission: EM | Admit: 2015-09-25 | Discharge: 2015-09-25 | Disposition: A | Discharge status: Other Acute Inpatient Hospital | Payer: Commercial Managed Care - HMO | Source: Home / Self Care | Attending: Emergency Medicine | Admitting: Emergency Medicine

## 2015-09-25 ENCOUNTER — Emergency Department
Admission: EM | Admit: 2015-09-25 | Discharge: 2015-09-25 | Disposition: A | Discharge status: Home / Self Care | Payer: Commercial Managed Care - HMO | Attending: Emergency Medicine | Admitting: Emergency Medicine

## 2015-09-25 ENCOUNTER — Encounter: Payer: Self-pay | Admitting: Emergency Medicine

## 2015-09-25 DIAGNOSIS — F4322 Adjustment disorder with anxiety: Principal | ICD-10-CM | POA: Diagnosis present

## 2015-09-25 DIAGNOSIS — Z818 Family history of other mental and behavioral disorders: Secondary | ICD-10-CM | POA: Diagnosis not present

## 2015-09-25 DIAGNOSIS — F319 Bipolar disorder, unspecified: Secondary | ICD-10-CM | POA: Diagnosis present

## 2015-09-25 DIAGNOSIS — M810 Age-related osteoporosis without current pathological fracture: Secondary | ICD-10-CM | POA: Diagnosis present

## 2015-09-25 DIAGNOSIS — I10 Essential (primary) hypertension: Secondary | ICD-10-CM

## 2015-09-25 DIAGNOSIS — J45909 Unspecified asthma, uncomplicated: Secondary | ICD-10-CM | POA: Insufficient documentation

## 2015-09-25 DIAGNOSIS — G510 Bell's palsy: Secondary | ICD-10-CM | POA: Diagnosis not present

## 2015-09-25 DIAGNOSIS — F419 Anxiety disorder, unspecified: Secondary | ICD-10-CM | POA: Diagnosis present

## 2015-09-25 DIAGNOSIS — Z9049 Acquired absence of other specified parts of digestive tract: Secondary | ICD-10-CM | POA: Diagnosis not present

## 2015-09-25 DIAGNOSIS — R45851 Suicidal ideations: Secondary | ICD-10-CM | POA: Insufficient documentation

## 2015-09-25 DIAGNOSIS — F411 Generalized anxiety disorder: Secondary | ICD-10-CM | POA: Diagnosis present

## 2015-09-25 DIAGNOSIS — F0390 Unspecified dementia without behavioral disturbance: Secondary | ICD-10-CM | POA: Diagnosis not present

## 2015-09-25 DIAGNOSIS — M1990 Unspecified osteoarthritis, unspecified site: Secondary | ICD-10-CM | POA: Insufficient documentation

## 2015-09-25 DIAGNOSIS — F3132 Bipolar disorder, current episode depressed, moderate: Secondary | ICD-10-CM

## 2015-09-25 DIAGNOSIS — F17210 Nicotine dependence, cigarettes, uncomplicated: Secondary | ICD-10-CM | POA: Insufficient documentation

## 2015-09-25 DIAGNOSIS — Z888 Allergy status to other drugs, medicaments and biological substances status: Secondary | ICD-10-CM

## 2015-09-25 DIAGNOSIS — Z809 Family history of malignant neoplasm, unspecified: Secondary | ICD-10-CM

## 2015-09-25 DIAGNOSIS — Z91411 Personal history of adult psychological abuse: Secondary | ICD-10-CM | POA: Diagnosis not present

## 2015-09-25 DIAGNOSIS — F17200 Nicotine dependence, unspecified, uncomplicated: Secondary | ICD-10-CM | POA: Diagnosis present

## 2015-09-25 DIAGNOSIS — Z79899 Other long term (current) drug therapy: Secondary | ICD-10-CM | POA: Insufficient documentation

## 2015-09-25 DIAGNOSIS — Z8249 Family history of ischemic heart disease and other diseases of the circulatory system: Secondary | ICD-10-CM

## 2015-09-25 DIAGNOSIS — Z8679 Personal history of other diseases of the circulatory system: Secondary | ICD-10-CM

## 2015-09-25 DIAGNOSIS — K219 Gastro-esophageal reflux disease without esophagitis: Secondary | ICD-10-CM | POA: Diagnosis present

## 2015-09-25 DIAGNOSIS — Z5181 Encounter for therapeutic drug level monitoring: Secondary | ICD-10-CM | POA: Insufficient documentation

## 2015-09-25 DIAGNOSIS — F418 Other specified anxiety disorders: Secondary | ICD-10-CM | POA: Insufficient documentation

## 2015-09-25 DIAGNOSIS — Z9889 Other specified postprocedural states: Secondary | ICD-10-CM | POA: Diagnosis not present

## 2015-09-25 LAB — BASIC METABOLIC PANEL
ANION GAP: 8 (ref 5–15)
BUN: 27 mg/dL — ABNORMAL HIGH (ref 6–20)
CALCIUM: 8.8 mg/dL — AB (ref 8.9–10.3)
CO2: 24 mmol/L (ref 22–32)
CREATININE: 1.28 mg/dL — AB (ref 0.44–1.00)
Chloride: 106 mmol/L (ref 101–111)
GFR calc non Af Amer: 42 mL/min — ABNORMAL LOW (ref >60)
GFR, EST AFRICAN AMERICAN: 49 mL/min — AB (ref >60)
Glucose, Bld: 87 mg/dL (ref 65–99)
Potassium: 4.2 mmol/L (ref 3.5–5.1)
SODIUM: 138 mmol/L (ref 135–145)

## 2015-09-25 LAB — URINALYSIS COMPLETE WITH MICROSCOPIC (ARMC ONLY)
BILIRUBIN URINE: NEGATIVE
Bacteria, UA: NONE SEEN
GLUCOSE, UA: NEGATIVE mg/dL
HGB URINE DIPSTICK: NEGATIVE
KETONES UR: NEGATIVE mg/dL
LEUKOCYTES UA: NEGATIVE
NITRITE: NEGATIVE
PH: 6 (ref 5.0–8.0)
Protein, ur: NEGATIVE mg/dL
SPECIFIC GRAVITY, URINE: 1.012 (ref 1.005–1.030)

## 2015-09-25 LAB — CBC
HEMATOCRIT: 44.3 % (ref 35.0–47.0)
HEMOGLOBIN: 14.7 g/dL (ref 12.0–16.0)
MCH: 31.5 pg (ref 26.0–34.0)
MCHC: 33.2 g/dL (ref 32.0–36.0)
MCV: 95 fL (ref 80.0–100.0)
Platelets: 120 10*3/uL — ABNORMAL LOW (ref 150–440)
RBC: 4.66 MIL/uL (ref 3.80–5.20)
RDW: 15.2 % — AB (ref 11.5–14.5)
WBC: 7.1 10*3/uL (ref 3.6–11.0)

## 2015-09-25 LAB — ETHANOL: Alcohol, Ethyl (B): <5 mg/dL (ref <5)

## 2015-09-25 MED ORDER — LORAZEPAM 1 MG PO TABS
ORAL_TABLET | ORAL | Status: AC
  Administered 2015-09-25: 1 mg via ORAL
  Filled 2015-09-25: qty 1

## 2015-09-25 MED ORDER — TRAZODONE HCL 100 MG PO TABS
100.0000 mg | ORAL_TABLET | Freq: Every evening | ORAL | Status: DC | PRN
  Administered 2015-09-25: 100 mg via ORAL
  Filled 2015-09-25: qty 1

## 2015-09-25 MED ORDER — QUETIAPINE FUMARATE 25 MG PO TABS: 50.0000 mg | ORAL_TABLET | Freq: Two times a day (BID) | ORAL | Status: DC

## 2015-09-25 MED ORDER — QUETIAPINE FUMARATE 25 MG PO TABS: 25.0000 mg | ORAL_TABLET | ORAL | Status: DC | PRN

## 2015-09-25 MED ORDER — GABAPENTIN 100 MG PO CAPS: 200.0000 mg | ORAL_CAPSULE | Freq: Two times a day (BID) | ORAL | Status: DC

## 2015-09-25 MED ORDER — DIVALPROEX SODIUM 500 MG PO DR TAB
500.0000 mg | DELAYED_RELEASE_TABLET | Freq: Two times a day (BID) | ORAL | Status: DC
  Administered 2015-09-25 – 2015-09-26 (×2): 500 mg via ORAL
  Filled 2015-09-25 (×2): qty 1

## 2015-09-25 MED ORDER — DIVALPROEX SODIUM 500 MG PO DR TAB: 500.0000 mg | DELAYED_RELEASE_TABLET | Freq: Two times a day (BID) | ORAL | Status: DC

## 2015-09-25 MED ORDER — NICOTINE 21 MG/24HR TD PT24: 21.0000 mg | MEDICATED_PATCH | Freq: Every day | TRANSDERMAL | Status: DC

## 2015-09-25 MED ORDER — PNEUMOCOCCAL VAC POLYVALENT 25 MCG/0.5ML IJ INJ: 0.5000 mL | INJECTION | INTRAMUSCULAR | Status: DC

## 2015-09-25 MED ORDER — QUETIAPINE FUMARATE 25 MG PO TABS
50.0000 mg | ORAL_TABLET | Freq: Two times a day (BID) | ORAL | Status: DC
  Administered 2015-09-25 – 2015-09-26 (×2): 50 mg via ORAL
  Filled 2015-09-25 (×2): qty 2

## 2015-09-25 MED ORDER — MAGNESIUM HYDROXIDE 400 MG/5ML PO SUSP: 30.0000 mL | Freq: Every day | ORAL | Status: DC | PRN

## 2015-09-25 MED ORDER — LORAZEPAM 1 MG PO TABS
1.0000 mg | ORAL_TABLET | Freq: Once | ORAL | Status: AC
  Administered 2015-09-25: 1 mg via ORAL

## 2015-09-25 MED ORDER — MIRTAZAPINE 15 MG PO TABS: 30.0000 mg | ORAL_TABLET | Freq: Every day | ORAL | Status: DC

## 2015-09-25 MED ORDER — MIRTAZAPINE 15 MG PO TABS
30.0000 mg | ORAL_TABLET | Freq: Every day | ORAL | Status: DC
  Administered 2015-09-25: 30 mg via ORAL
  Filled 2015-09-25: qty 2

## 2015-09-25 MED ORDER — GABAPENTIN 100 MG PO CAPS
200.0000 mg | ORAL_CAPSULE | Freq: Two times a day (BID) | ORAL | Status: DC
  Administered 2015-09-25 – 2015-09-26 (×2): 200 mg via ORAL
  Filled 2015-09-25 (×2): qty 2

## 2015-09-25 NOTE — ED Notes (Signed)
Her daughter Ferol LuzMary Ann Tucker is here to pick her up - pt belongings returned to her at this time and she verbalizes that she has received back all belongings that she came here with

## 2015-09-25 NOTE — ED Notes (Signed)
Daughter Elnoria Howard(Carolann) [phone] and husband [name](Dan) [phone] were asked to wait in the lobby while we called to see if TTS wanted to speak with them. Officer from the lobby gave this RN these phone numbers for the family and informed me that as soon as pt had been brought back daughter and husband left.

## 2015-09-25 NOTE — ED Provider Notes (Signed)
Patient was seen and evaluated this morning was here voluntarily. No IVC paperwork is been established. Patient has numerous visits here to the emergency department for generalized anxiety disorder. She has been told on multiple occasions that no benzodiazepines will be prescribed due to a previous history of overdose. Discussion at the bedside the patient's adamant that she is not suicidal, homicidal, or having hallucinations. She just feels anxious and had just seen her psychiatrist yesterday. Patient was advised to contact her psychiatrist later today to see if they would like to do any outpatient adjustments of her medication but I did not see a reason to hold the patient here against her will. She does have follow-up and is currently on medication and again denies any suicidal thoughts at this time.  Jennye MoccasinBrian S Magnolia Mattila, MD 09/25/15 [phone]
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
## 2015-09-25 NOTE — ED Notes (Signed)
She has been seen by [facility] and she will be admitted to inpt unit- [facility]  Bed 302
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
## 2015-09-25 NOTE — Discharge Instructions (Signed)
Generalized Anxiety Disorder Generalized anxiety disorder (GAD) is a mental disorder. It interferes with life functions, including relationships, work, and school. GAD is different from normal anxiety, which everyone experiences at some point in their lives in response to specific life events and activities. Normal anxiety actually helps us prepare for and get through these life events and activities. Normal anxiety goes away after the event or activity is over.  GAD causes anxiety that is not necessarily related to specific events or activities. It also causes excess anxiety in proportion to specific events or activities. The anxiety associated with GAD is also difficult to control. GAD can vary from mild to severe. People with severe GAD can have intense waves of anxiety with physical symptoms (panic attacks).  SYMPTOMS The anxiety and worry associated with GAD are difficult to control. This anxiety and worry are related to many life events and activities and also occur more days than not for 6 months or longer. People with GAD also have three or more of the following symptoms (one or more in children):  Restlessness.   Fatigue.  Difficulty concentrating.   Irritability.  Muscle tension.  Difficulty sleeping or unsatisfying sleep. DIAGNOSIS GAD is diagnosed through an assessment by your health care provider. Your health care provider will ask you questions aboutyour mood,physical symptoms, and events in your life. Your health care provider may ask you about your medical history and use of alcohol or drugs, including prescription medicines. Your health care provider may also do a physical exam and blood tests. Certain medical conditions and the use of certain substances can cause symptoms similar to those associated with GAD. Your health care provider may refer you to a mental health specialist for further evaluation. TREATMENT The following therapies are usually used to treat GAD:    Medication. Antidepressant medication usually is prescribed for long-term daily control. Antianxiety medicines may be added in severe cases, especially when panic attacks occur.   Talk therapy (psychotherapy). Certain types of talk therapy can be helpful in treating GAD by providing support, education, and guidance. A form of talk therapy called cognitive behavioral therapy can teach you healthy ways to think about and react to daily life events and activities.  Stress managementtechniques. These include yoga, meditation, and exercise and can be very helpful when they are practiced regularly. A mental health specialist can help determine which treatment is best for you. Some people see improvement with one therapy. However, other people require a combination of therapies.   This information is not intended to replace advice given to you by your health care provider. Make sure you discuss any questions you have with your health care provider.   Document Released: 08/28/2012 Document Revised: 05/24/2014 Document Reviewed: 08/28/2012 Elsevier Interactive Patient Education 2016 [employer].  

## 2015-09-25 NOTE — ED Notes (Signed)
BEHAVIORAL HEALTH ROUNDING Patient sleeping: No. Patient alert and oriented: yes Behavior appropriate: anxious.  ; If no, describe:  Nutrition and fluids offered: yes Toileting and hygiene offered: Yes  Sitter present: q15 minute observations and security monitoring Law enforcement present: Yes  ODS

## 2015-09-25 NOTE — ED Notes (Signed)
Pt on phone with family member. 

## 2015-09-25 NOTE — ED Notes (Signed)
She will be discharged to home once a safe ride arrives here to pick her up
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
## 2015-09-25 NOTE — ED Notes (Signed)
Patient observed lying in bed with eyes closed  Even, unlabored respirations observed   NAD pt appears to be sleeping  I will continue to monitor along with every 15 minute visual observations and ongoing security monitoring    

## 2015-09-25 NOTE — ED Notes (Signed)
She has been pacing from her bed to the door way  - informed her that the doctor will not be ordering any more anxiety medication that she had requested  - she screaches "Why not"  MD to the room for assessment

## 2015-09-25 NOTE — ED Notes (Signed)
She has been seen by the MD and she is well aware that no more Ativan will be ordered for her at this time   Pt states  "Just let me leave - I will leave AMA - just let me leave."
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
## 2015-09-25 NOTE — ED Notes (Signed)
ED BHU PLACEMENT JUSTIFICATION Is the patient under IVC or is there intent for IVC:  Is the patient medically cleared: Yes.   Is there vacancy in the ED BHU: Yes.   Is the population mix appropriate for patient: Yes.   Is the patient awaiting placement in inpatient or outpatient setting:   Has the patient had a psychiatric consult:  Consult pending    Survey of unit performed for contraband, proper placement and condition of furniture, tampering with fixtures in bathroom, shower, and each patient room: Yes.  ; Findings:  APPEARANCE/ Anxious- fearful NEURO ASSESSMENT Orientation: oriented x4  Denies pain Hallucinations: No.None noted (Hallucinations) Speech: Normal Gait: normal RESPIRATORY ASSESSMENT Even  Unlabored respirations  CARDIOVASCULAR ASSESSMENT Pulses equal   regular rate  Skin warm and dry   GASTROINTESTINAL ASSESSMENT no GI complaint EXTREMITIES Full ROM  PLAN OF CARE Provide calm/safe environment. Vital signs assessed twice daily. ED BHU Assessment once each 12-hour shift. Collaborate with intake RN daily or as condition indicates. Assure the ED provider has rounded once each shift. Provide and encourage hygiene. Provide redirection as needed. Assess for escalating behavior; address immediately and inform ED provider.  Assess family dynamic and appropriateness for visitation as needed: Yes.  ; If necessary, describe findings:  Educate the patient/family about BHU procedures/visitation: Yes.  ; If necessary, describe findings:

## 2015-09-25 NOTE — Progress Notes (Addendum)
[facility]. Toni Amendlapacs.  Attending Physician will be Dr. Jennet MaduroPucilowska.   Patient [facility] Marylu LundJanet.   Intake Paper Work has been signed and placed on patient chart.  ER staff is aware of the admission Carlisle Beers( [facility] ER Sect.; ER MD; Amy Patient's Nurse & Lowanda FosterBrittany  Patient Access).   09/25/2015 Cheryl FlashNicole Aster Screws, MS, NCC, LPCA Therapeutic Triage Specialist

## 2015-09-25 NOTE — ED Notes (Addendum)
BEHAVIORAL HEALTH ROUNDING Patient sleeping: No. Patient alert and oriented: yes Behavior appropriate:  anxious.  ; If no, describe:  Nutrition and fluids offered: yes Toileting and hygiene offered: Yes  Sitter present: q15 minute observations and security monitoring Law enforcement present: Yes  ODS  ENVIRONMENTAL ASSESSMENT Potentially harmful objects out of patient reach: Yes.   Personal belongings secured: Yes.   Patient dressed in hospital provided attire only: Yes.   Plastic bags out of patient reach: Yes.   Patient care equipment (cords, cables, call bells, lines, and drains) shortened, removed, or accounted for: Yes.   Equipment and supplies removed from bottom of stretcher: Yes.   Potentially toxic materials out of patient reach: Yes.   Sharps container removed or out of patient reach: Yes.
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
## 2015-09-25 NOTE — ED Notes (Signed)

## 2015-09-25 NOTE — ED Notes (Signed)
Pt here via police transport after suicide attempt thirty minutes ago. Pt tearful in triage, states she has had suicidal thoughts, however, "would never go through with it, because I have 2 beautiful daughters." Pt states, "I just want a shot to forget everything and go to sleep." Mildly agitated in triage, states, "I just want to go home."

## 2015-09-25 NOTE — Tx Team (Addendum)
Initial Interdisciplinary Treatment Plan   PATIENT STRESSORS: Financial difficulties Marital or family conflict   PATIENT STRENGTHS: Average or above average intelligence Communication skills Supportive family/friends   PROBLEM LIST: Problem List/Patient Goals Date to be addressed Date deferred Reason deferred Estimated date of resolution  Depression 09/25/15     Anxiety 09/25/15           "being happy" 09/25/15                                    DISCHARGE CRITERIA:  Adequate post-discharge living arrangements Improved stabilization in mood, thinking, and/or behavior Motivation to continue treatment in a less acute level of care Need for constant or close observation no longer present Reduction of life-threatening or endangering symptoms to within safe limits Verbal commitment to aftercare and medication compliance  PRELIMINARY DISCHARGE PLAN: Attend aftercare/continuing care group Outpatient therapy Participate in family therapy  PATIENT/FAMIILY INVOLVEMENT: This treatment plan has been presented to and reviewed with the patient, Deborah [name], and/or family member.  The patient and family have been given the opportunity to ask questions and make suggestions.  Stann MainlandMichelle L Parsells 09/25/2015, 11:23 PM

## 2015-09-25 NOTE — ED Notes (Signed)
Pt standing in the doorway

## 2015-09-25 NOTE — ED Provider Notes (Signed)
[facility] Emergency Department Provider Note  ____________________________________________  Time seen:   I have reviewed the triage vital signs and the nursing notes.   HISTORY  Chief Complaint Suicidal      HPI Deborah [name] is a 70 y.o. female with history of depression and anxiety presents via her husband and daughter who states that the patient for suicidal ideation to them. Patient denies any suicidal ideation however does admit to feeling anxious and is requesting "a shot for her anxiety". Patient states that all she needs is a shot for anxiety and that she wants to leave following receiving it.   Past Medical History  Diagnosis Date  . Asthma   . Ulcer   . Heart murmur   . Cystitis   . Arthritis   . Depression   . Spastic colon   . IBS (irritable bowel syndrome)   . Osteoporosis   . Anxiety     Patient Active Problem List   Diagnosis Date Noted  . Anxiety as acute reaction to exceptional stress 07/23/2015  . Generalized anxiety disorder 07/23/2015  . Overdose 07/03/2015  . Mild dementia 05/29/2015  . 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 Depression, major, recurrent, moderate (HCC) 04/01/2015  . Bipolar 1 disorder, depressed, moderate (HCC)   . Clinical depression 12/24/2014  . H/O disease 09/08/2014  . History of migraine headaches 09/08/2014  . H/O Bell's palsy 09/08/2014  . H/O: HTN (hypertension) 09/08/2014  . Gastroesophageal reflux disease without esophagitis 09/08/2014  . CAFL (chronic airflow limitation) (HCC) 09/08/2014  . History of asthma 09/08/2014  . 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 Cholecystectomy    . Spine surgery    . Colonoscopy  2002  . Upper gi endoscopy  2011  . Arm surgery Left 2011    fracture repair  . Throat biopsy   2012  . Shoulder surgery    . Elbow surgery    . Breast  biopsy      LCIS    Current Outpatient Rx  Name  Route  Sig  Dispense  Refill  . alendronate (FOSAMAX) 70 MG tablet   Oral   Take 1 tablet by mouth once a week.      5   . clonazePAM (KLONOPIN) 0.5 MG tablet   Oral   Take 0.5 mg by mouth daily as needed for anxiety.         . divalproex (DEPAKOTE) 250 MG DR tablet   Oral   Take 500 tablets by mouth 2 (two) times daily.       5   . donepezil (ARICEPT) 5 MG tablet   Oral   Take 5 mg by mouth at bedtime.          Marland Kitchen. escitalopram (LEXAPRO) 10 MG tablet   Oral   Take 10 mg by mouth daily.         Marland Kitchen. gabapentin (NEURONTIN) 100 MG capsule      1 bid  For 3 days then 2   Bid Patient taking differently: Take 100-200 mg by mouth 2 (two) times daily. 1 bid  For 3 days then 2   Bid   120 capsule   2   . hydrOXYzine (ATARAX/VISTARIL) 25 MG tablet   Oral   Take 25 mg by mouth 3 (three) times daily as needed for anxiety or itching.          .Marland Kitchen  mirtazapine (REMERON) 15 MG tablet   Oral   Take 15 mg by mouth at bedtime.         . mirtazapine (REMERON) 30 MG tablet   Oral   Take 1 tablet (30 mg total) by mouth at bedtime.   30 tablet   3   . ondansetron (ZOFRAN ODT) 4 MG disintegrating tablet   Oral   Take 1 tablet (4 mg total) by mouth every 6 (six) hours as needed for nausea or vomiting.   20 tablet   0   . ranitidine (ZANTAC) 150 MG tablet   Oral   Take 1 tablet (150 mg total) by mouth at bedtime.   30 tablet   0   . traZODone (DESYREL) 100 MG tablet   Oral   Take 100 mg by mouth at bedtime.         Marland Kitchen QUEtiapine (SEROQUEL) 50 MG tablet   Oral   Take 1-2 tablets (50-100 mg total) by mouth 2 (two) times daily. Take 1 tablet ( ) in the morning and 2 tablets ( ) at bedtime   60 tablet   3     Allergies Dristan cold and Prednisone  Family History  Problem Relation Age of Onset  . Cancer Sister     ovarian  . Cancer Maternal Aunt     breast  . Cancer Maternal Aunt     breast  . Heart  attack Mother     Social History Social History  Substance Use Topics  . Smoking status: Current Every Day Smoker -- 1.00 packs/day for 20 years    Types: Cigarettes    Start date: 03/31/1977  . Smokeless tobacco: Never Used  . Alcohol Use: No     Comment: Ocassional    Review of Systems  Constitutional: Negative for fever. Eyes: Negative for visual changes. ENT: Negative for sore throat. Cardiovascular: Negative for chest pain. Respiratory: Negative for shortness of breath. Gastrointestinal: Negative for abdominal pain, vomiting and diarrhea. Genitourinary: Negative for dysuria. Musculoskeletal: Negative for back pain. Skin: Negative for rash. Neurological: Negative for headaches, focal weakness or numbness. Psychiatric: Positive for anxiety  10-point ROS otherwise negative.  ____________________________________________   PHYSICAL EXAM:  VITAL SIGNS: ED Triage Vitals  Enc Vitals Group     BP 09/25/15 0541 127/59 mmHg     Pulse Rate 09/25/15 0541 85     Resp 09/25/15 0541 18     Temp 09/25/15 0541 97.5 F (36.4 C)     Temp Source 09/25/15 0541 Oral     SpO2 09/25/15 0541 97 %     Weight 09/25/15 0541 123 lb (55.792 kg)     Height 09/25/15 0541  (1.575 m)     Head Cir --      Peak Flow --      Pain Score 09/25/15 0542 0     Pain Loc --      Pain Edu? --      Excl. in GC? --     Constitutional: Alert and oriented. Well appearing and in no distress. Eyes: Conjunctivae are normal. PERRL. Normal extraocular movements. ENT   Head: Normocephalic and atraumatic.   Nose: No congestion/rhinnorhea.   Mouth/Throat: Mucous membranes are moist.   Neck: No stridor. Hematological/Lymphatic/Immunilogical: No cervical lymphadenopathy. Cardiovascular: Normal rate, regular rhythm. Normal and symmetric distal pulses are present in all extremities. No murmurs, rubs, or gallops. Respiratory: Normal respiratory effort without tachypnea nor retractions. Breath  sounds are clear and equal bilaterally. No  wheezes/rales/rhonchi. Gastrointestinal: Soft and nontender. No distention. There is no CVA tenderness. Genitourinary: deferred Musculoskeletal: Nontender with normal range of motion in all extremities. No joint effusions.  No lower extremity tenderness nor edema. Neurologic:  Normal speech and language. No gross focal neurologic deficits are appreciated. Speech is normal.  Skin:  Skin is warm, dry and intact. No rash noted. Psychiatric: Anxious affect. Speech and behavior are normal. Patient exhibits appropriate insight and judgment.  ____________________________________________    LABS (pertinent positives/negatives)  Labs Reviewed  CBC - Abnormal; Notable for the following:    RDW 15.2 (*)    Platelets 120 (*)    All other components within normal limits  BASIC METABOLIC PANEL - Abnormal; Notable for the following:    BUN 27 (*)    Creatinine, Ser 1.28 (*)    Calcium 8.8 (*)    GFR calc non Af Amer 42 (*)    GFR calc Af Amer 49 (*)    All other components within normal limits  ETHANOL  URINALYSIS COMPLETEWITH MICROSCOPIC ([facility] ONLY)           INITIAL IMPRESSION / ASSESSMENT AND PLAN / ED COURSE  Pertinent labs & imaging results that were available during my care of the patient were reviewed by me and considered in my medical decision making (see chart for details).  Awaiting psychiatry consultation  ____________________________________________   FINAL CLINICAL IMPRESSION(S) / ED DIAGNOSES  Final diagnoses:  Generalized anxiety disorder  Suicidal ideation      Darci Current, MD 09/25/15 [phone]

## 2015-09-25 NOTE — ED Notes (Signed)
Pt cooperative, calm at this time.

## 2015-09-25 NOTE — ED Notes (Signed)
Pt in with co feeling suicidal per family family states has been here for the same in the past, hx of dementia.
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
## 2015-09-25 NOTE — ED Notes (Signed)
Pt alert and oriented X4, active, cooperative, pt in NAD. RR even and unlabored, color WNL.  Pt informed to return if any life threatening symptoms occur.   

## 2015-09-25 NOTE — ED Notes (Signed)
Received a call from pts daughter Ferol LuzMary Ann Tucker  [phone]  Informed her that pt is still waiting to consult with doctor    Advised of visiting hours and phone times  Continue to monitor

## 2015-09-26 DIAGNOSIS — F4322 Adjustment disorder with anxiety: Principal | ICD-10-CM

## 2015-09-26 LAB — VALPROIC ACID LEVEL: Valproic Acid Lvl: 73 ug/mL (ref 50.0–100.0)

## 2015-09-26 LAB — LIPID PANEL
CHOLESTEROL: 197 mg/dL (ref 0–200)
HDL: 47 mg/dL (ref >40)
LDL Cholesterol: 130 mg/dL — ABNORMAL HIGH (ref 0–99)
TRIGLYCERIDES: 102 mg/dL (ref <150)
Total CHOL/HDL Ratio: 4.2 RATIO
VLDL: 20 mg/dL (ref 0–40)

## 2015-09-26 LAB — HEMOGLOBIN A1C: Hgb A1c MFr Bld: 5.1 % (ref 4.0–6.0)

## 2015-09-26 LAB — TSH: TSH: 3.931 u[IU]/mL (ref 0.350–4.500)
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
## 2015-09-26 NOTE — [facility] Suicide Risk Assessment (Signed)
[facility] INPATIENT:  Family/Significant Other Suicide Prevention Education  Suicide Prevention Education:  Patient Refusal for Family/Significant Other Suicide Prevention Education: The patient Deborah [name] has refused to provide written consent for family/significant other to be provided Family/Significant Other Suicide Prevention Education during admission and/or prior to discharge.  Physician notified. Pt refused SPE from the CSW.  Deborah [name] 09/26/2015, 2:00 PM

## 2015-09-26 NOTE — Progress Notes (Signed)
Patient denies SI/HI, denies A/V hallucinations. Patient verbalizes understanding of discharge instructions, follow up care and prescriptions. Patient given all belongings from  locker. Patient escorted out by staff, transported by family. 

## 2015-09-26 NOTE — [facility] Group Notes (Addendum)
[facility] LCSW Group Therapy  09/26/2015 10:38 AM  Type of Therapy:  Group Therapy  Participation Level:  Active  Participation Quality:  Drowsy, Intrusive and Redirectable  Affect:  Irritable and Labile  Cognitive:  Disorganized  Insight:  Lacking  Engagement in Therapy:  Lacking  Modes of Intervention:  Socialization and Support  Summary of Progress/Problems: Patient attended and participated in group discussion but was disorganized and intrusive and was redirected several times. Patient introduced herself during an introductory exercise shared 3 things she would take [place] "my 2 girls and some food".Patient was complaining about not being able to get coffee during group and then became dizzy and facilitator called staff to escort patient to her room after 20 minutes of group as patient appeared to be about to faint.   Lulu RidingIngle, Nima Kemppainen T, MSW, LCSW 09/26/2015, 10:38 AM

## 2015-09-26 NOTE — [facility] Suicide Risk Assessment (Signed)
[facility]   Principal Problem: Adjustment disorder with anxious mood Discharge Diagnoses:  Patient Active Problem List   Diagnosis Date Noted  . Adjustment disorder with anxious mood [F43.22] 09/26/2015  . Bipolar 1 disorder, depressed (HCC) [F31.9] 09/25/2015  . Tobacco use disorder [F17.200] 09/25/2015  . Anxiety as acute reaction to exceptional stress [F41.9] 07/23/2015  . Mild dementia [F03.90] 05/29/2015  . History of migraine headaches [Z86.69] 09/08/2014  . H/O Bell's palsy [Z86.69] 09/08/2014  . H/O: HTN (hypertension) [Z86.79] 09/08/2014  . Gastroesophageal reflux disease without esophagitis [K21.9] 09/08/2014  . History of asthma [Z87.09] 09/08/2014  . Breast neoplasm, Tis (LCIS) [D05.00] 01/23/2013    Total Time spent with patient: 1 hour  Musculoskeletal: Strength & Muscle Tone: within normal limits Gait & Station: normal Patient leans: N/A  Psychiatric Specialty Exam: Review of Systems  Gastrointestinal: Positive for heartburn and nausea.  Psychiatric/Behavioral: The patient is nervous/anxious.   All other systems reviewed and are negative.   Blood pressure 107/64, pulse 83, temperature 98.4 F (36.9 C), temperature source Oral, resp. rate 16, height 5' 3.78" (1.62 m), weight 54.885 kg (121 lb), SpO2 95 %.Body mass index is 20.91 kg/(m^2).   See SRA.                                                     Mental Status Per Nursing Assessment::   On Admission:  NA  Demographic Factors:  Age 70 or older and Caucasian  Loss Factors: NA  Historical Factors: Family history of mental illness or substance abuse and Impulsivity  Risk Reduction Factors:   Sense of responsibility to family, Living with another person, especially a relative, Positive social support and Positive therapeutic relationship  Continued Clinical Symptoms:  Severe Anxiety and/or Agitation Bipolar Disorder:   Depressive  phase  Cognitive Features That Contribute To Risk:  None    Suicide Risk:  Minimal: No identifiable suicidal ideation.  Patients presenting with no risk factors but with morbid ruminations; may be classified as minimal risk based on the severity of the depressive symptoms    Plan Of Care/Follow-up recommendations:  Activity:  as tolerated. Diet:  low sodium heart healthy. Other:  keep follow up appointments.  Kristine LineaJolanta Rebeckah Masih, MD 09/26/2015, 1:20 PM

## 2015-09-26 NOTE — Progress Notes (Signed)
Patient ID: Matthias HughsBetty S Ante, female   DOB: Dec 02, 1945, 70 y.o.   MRN: [id number] Pt was admitted and discharged within a 24 hour period and after the previous tx team mtg and before the next scheduled tx team mtg and thus, a PSa and a TX Team note are not needed.  Dorothe PeaJonathan F. Glenola Wheat, LCSWA, LCAS  09/26/15

## 2015-09-27 LAB — PROLACTIN: PROLACTIN: 18.5 ng/mL (ref 4.8–23.3)

## 2015-10-01 ENCOUNTER — Telehealth (HOSPITAL_COMMUNITY): Payer: Self-pay

## 2015-10-01 ENCOUNTER — Ambulatory Visit (HOSPITAL_COMMUNITY): Payer: Self-pay | Admitting: Psychiatry

## 2015-10-01 DIAGNOSIS — F332 Major depressive disorder, recurrent severe without psychotic features: Secondary | ICD-10-CM | POA: Diagnosis not present

## 2015-10-01 DIAGNOSIS — K279 Peptic ulcer, site unspecified, unspecified as acute or chronic, without hemorrhage or perforation: Secondary | ICD-10-CM | POA: Diagnosis not present

## 2015-10-01 DIAGNOSIS — F410 Panic disorder [episodic paroxysmal anxiety] without agoraphobia: Secondary | ICD-10-CM | POA: Diagnosis not present

## 2015-10-02 ENCOUNTER — Ambulatory Visit (HOSPITAL_COMMUNITY): Payer: Self-pay | Admitting: Clinical

## 2015-10-02 DIAGNOSIS — F39 Unspecified mood [affective] disorder: Secondary | ICD-10-CM | POA: Diagnosis not present

## 2015-10-02 DIAGNOSIS — E538 Deficiency of other specified B group vitamins: Secondary | ICD-10-CM | POA: Insufficient documentation

## 2015-10-02 DIAGNOSIS — F331 Major depressive disorder, recurrent, moderate: Secondary | ICD-10-CM | POA: Diagnosis not present

## 2015-10-05 ENCOUNTER — Encounter: Payer: Self-pay | Admitting: Gynecology

## 2015-10-05 ENCOUNTER — Ambulatory Visit (INDEPENDENT_AMBULATORY_CARE_PROVIDER_SITE_OTHER): Payer: Commercial Managed Care - HMO

## 2015-10-05 ENCOUNTER — Ambulatory Visit
Admission: EM | Admit: 2015-10-05 | Discharge: 2015-10-05 | Disposition: A | Discharge status: Home / Self Care | Payer: Commercial Managed Care - HMO | Attending: Family Medicine | Admitting: Family Medicine

## 2015-10-05 DIAGNOSIS — R1011 Right upper quadrant pain: Secondary | ICD-10-CM

## 2015-10-05 DIAGNOSIS — R0781 Pleurodynia: Secondary | ICD-10-CM | POA: Diagnosis not present

## 2015-10-05 LAB — COMPREHENSIVE METABOLIC PANEL
ALBUMIN: 4 g/dL (ref 3.5–5.0)
ALT: 12 U/L — ABNORMAL LOW (ref 14–54)
ANION GAP: 7 (ref 5–15)
AST: 16 U/L (ref 15–41)
Alkaline Phosphatase: 51 U/L (ref 38–126)
BILIRUBIN TOTAL: 0.6 mg/dL (ref 0.3–1.2)
BUN: 19 mg/dL (ref 6–20)
CHLORIDE: 106 mmol/L (ref 101–111)
CO2: 23 mmol/L (ref 22–32)
Calcium: 8.8 mg/dL — ABNORMAL LOW (ref 8.9–10.3)
Creatinine, Ser: 1.09 mg/dL — ABNORMAL HIGH (ref 0.44–1.00)
GFR calc Af Amer: 59 mL/min — ABNORMAL LOW (ref >60)
GFR calc non Af Amer: 51 mL/min — ABNORMAL LOW (ref >60)
GLUCOSE: 89 mg/dL (ref 65–99)
POTASSIUM: 4.4 mmol/L (ref 3.5–5.1)
SODIUM: 136 mmol/L (ref 135–145)
TOTAL PROTEIN: 6.9 g/dL (ref 6.5–8.1)

## 2015-10-05 LAB — URINALYSIS COMPLETE WITH MICROSCOPIC (ARMC ONLY)
BILIRUBIN URINE: NEGATIVE
Glucose, UA: NEGATIVE mg/dL
Ketones, ur: NEGATIVE mg/dL
LEUKOCYTES UA: NEGATIVE
NITRITE: NEGATIVE
PH: 7 (ref 5.0–8.0)
PROTEIN: 30 mg/dL — AB
Specific Gravity, Urine: 1.02 (ref 1.005–1.030)

## 2015-10-05 MED ORDER — IBUPROFEN 400 MG PO TABS
400.0000 mg | ORAL_TABLET | Freq: Once | ORAL | Status: AC
  Administered 2015-10-05: 400 mg via ORAL

## 2015-10-05 NOTE — ED Notes (Signed)
Pt told nurse "I don't feel good and I want to go home, when is the doctor coming?". Informed her we are waiting for the radiologist to read her xrays, Anette RiedelLaurie Lee PA working on it. Requested pt to lie down on stretcher and she went back to room. Daughter in room arguing with mother telling pt "you are the problem".

## 2015-10-05 NOTE — ED Notes (Signed)
Pt provided crackers and peanut butter with ibuprofen and warm water compress as she now reports neck pain.

## 2015-10-05 NOTE — Discharge Instructions (Signed)
Lab studies were essentially normal and Xray was negative for and bone injury of the ribs. Please advance your diet as tolerated and report to Emergency Room if discomfort returns or symptoms increase Please call your Doctor to be seen in the office in the next 2 days    Abdominal Pain, Adult Many things can cause abdominal pain. Usually, abdominal pain is not caused by a disease and will improve without treatment. It can often be observed and treated at home. Your health care provider will do a physical exam and possibly order blood tests and X-rays to help determine the seriousness of your pain. However, in many cases, more time must pass before a clear cause of the pain can be found. Before that point, your health care provider may not know if you need more testing or further treatment. HOME CARE INSTRUCTIONS Monitor your abdominal pain for any changes. The following actions may help to alleviate any discomfort you are experiencing:  Only take over-the-counter or prescription medicines as directed by your health care provider.  Do not take laxatives unless directed to do so by your health care provider.  Try a clear liquid diet (broth, tea, or water) as directed by your health care provider. Slowly move to a bland diet as tolerated. SEEK MEDICAL CARE IF:  You have unexplained abdominal pain.  You have abdominal pain associated with nausea or diarrhea.  You have pain when you urinate or have a bowel movement.  You experience abdominal pain that wakes you in the night.  You have abdominal pain that is worsened or improved by eating food.  You have abdominal pain that is worsened with eating fatty foods.  You have a fever. SEEK IMMEDIATE MEDICAL CARE IF:  Your pain does not go away within 2 hours.  You keep throwing up (vomiting).  Your pain is felt only in portions of the abdomen, such as the right side or the left lower portion of the abdomen.  You pass bloody or black tarry  stools. MAKE SURE YOU:  Understand these instructions.  Will watch your condition.  Will get help right away if you are not doing well or get worse.   This information is not intended to replace advice given to you by your health care provider. Make sure you discuss any questions you have with your health care provider.   Document Released: 02/10/2005 Document Revised: 01/22/2015 Document Reviewed: 01/10/2013 Elsevier Interactive Patient Education [employer]2016 [employer]
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
## 2015-10-05 NOTE — ED Notes (Addendum)
Pt walked to bathroom but then appeared weak and unsteady, pt taken from bathroom back to TR by w/c and provided a cup of water. Pt anxious and easily startled. Pt then reported to nurse she was having Right rib pain and asked when the doctor was going to come in and see her. Informed pt and spouse that the doctor had a few patients before her and we will be waiting for her urine test results.   Pt looks disheveled, wearing nightgown, bathrobe and slippers. Spouse at bedside.

## 2015-10-05 NOTE — ED Provider Notes (Signed)
CSN: [id number]     Arrival date & time 10/05/15  4098 History   First MD Initiated Contact with Patient 10/05/15 [phone]     Chief Complaint  Patient presents with  . Flank Pain   (Consider location/radiation/quality/duration/timing/severity/associated sxs/prior Treatment) HPI  70 yo F attended by husband and daughter with whom she lives. Daughter reports she has dementia diagnosis and  states her behavior yesterday and today is unchanged from her usual. She ate and drank normally yesterday. No NVD reported yesterday. Appeared to have had  a quiet night. Patient states she has been up for many many hours but daughter states mother awakened about 8 am (currently 10:30) Mother states she has intermittent right sided abdominal pain under her ribs . She also states she had nausea and vomiting this morning- not witnessed. Daughter reports and patient confirms that she has not had abdominal or rib trauma.  One PPD smoker Medications reviewed with patient and  family    Past Medical History  Diagnosis Date  . Asthma   . Ulcer   . Heart murmur   . Cystitis   . Arthritis   . Depression   . Spastic colon   . IBS (irritable bowel syndrome)   . Osteoporosis   . Anxiety    Past Surgical History  Procedure Laterality Date  . Cholecystectomy    . Spine surgery    . Colonoscopy  2002  . Upper gi endoscopy  2011  . Arm surgery Left 2011    fracture repair  . Throat biopsy   2012  . Shoulder surgery    . Elbow surgery    . Breast biopsy      LCIS   Family History  Problem Relation Age of Onset  . Cancer Sister     ovarian  . Cancer Maternal Aunt     breast  . Cancer Maternal Aunt     breast  . Heart attack Mother    Social History  Substance Use Topics  . Smoking status: Current Every Day Smoker -- 1.00 packs/day for 20 years    Types: Cigarettes    Start date: 03/31/1977  . Smokeless tobacco: Never Used  . Alcohol Use: No     Comment: Ocassional   OB History    Gravida Para  Term Preterm AB TAB SAB Ectopic Multiple Living   1         2      Obstetric Comments   Menstrual age: 57  Age 1st Pregnancy: 85     Review of Systems Constitutional: No fever. No headache. Eyes: No visual changes. ENT:No sore throat. Cardiovascular:Negative for chest pain/palpitations Respiratory: Negative for shortness of breath Gastrointestinal: RUQ intermittent discomfort. Report nausea,vomiting ( not witnessed), but denies  diarrhea Genitourinary: Negative for dysuria. Normal urination. Musculoskeletal: Negative for back pain. FROM extremities without pain Skin: Negative for rash Neurological: Negative for headache, focal weakness or numbness, ambulatory   Allergies  Dristan cold and Prednisone  Home Medications   Prior to Admission medications   Medication Sig Start Date End Date Taking? Authorizing Provider  alendronate (FOSAMAX) 70 MG tablet Take 1 tablet by mouth once a week. Reported on 09/25/2015 08/29/15   Historical Provider, MD  divalproex (DEPAKOTE) 250 MG DR tablet Take 500 tablets by mouth 2 (two) times daily.  09/14/15   Historical Provider, MD  donepezil (ARICEPT) 5 MG tablet Take 5 mg by mouth at bedtime.     Historical Provider, MD  escitalopram Judye Bos)  10 MG tablet Take 10 mg by mouth daily.    Historical Provider, MD  gabapentin (NEURONTIN) 100 MG capsule 1 bid  For 3 days then 2   Bid Patient taking differently: Take 100-200 mg by mouth 2 (two) times daily. 1 bid  For 3 days then 2   Bid 09/24/15   Deborah AsaGerald Plovsky, MD  mirtazapine (REMERON) 30 MG tablet Take 1 tablet (30 mg total) by mouth at bedtime. 09/24/15   Deborah AsaGerald Plovsky, MD  ondansetron (ZOFRAN ODT) 4 MG disintegrating tablet Take 1 tablet (4 mg total) by mouth every 8 (eight) hours as needed for nausea or vomiting. 10/06/15   Deborah MenghiniAnne-Caroline Norman, MD  QUEtiapine (SEROQUEL) 50 MG tablet Take 1-2 tablets (50-100 mg total) by mouth 2 (two) times daily. Take 1 tablet (50mg ) in the morning and 2 tablets  (100mg ) at bedtime 09/24/15   Deborah AsaGerald Plovsky, MD  ranitidine (ZANTAC) 150 MG tablet Take 1 tablet (150 mg total) by mouth at bedtime. 09/11/15 09/10/16  Deborah CreamerMark Quale, MD  traZODone (DESYREL) 100 MG tablet Take 100 mg by mouth at bedtime.    Historical Provider, MD   Meds Ordered and Administered this Visit   Medications  ibuprofen (ADVIL,MOTRIN) tablet 400 mg (400 mg Oral Given 10/05/15 1144)  well tolerated  BP 123/76 mmHg  Pulse 80  Temp(Src) 98.4 F (36.9 C) (Oral)  Resp 16  Ht 5\' 3"  (1.6 m)  Wt 115 lb (52.164 kg)  BMI 20.38 kg/m2  SpO2 98% No data found.   Physical Exam   Constitutional -alert , interactive, disheveled, unkempt appeatring.Deborah [name].Dressed in her pajamas and bathrobe.  Reporting  Right sided abdominal pain/distress. Stretched out supine on stretcher, resting quietly. She startles very easily with entry in to room or movement about the bed. Later in the department is noted to very jumpy when individuals move around in her vicinity. She does communicate , appears to understand and answers questions appropriately General: VSS afebrile , complains of feeling weak-ambulated into department and to bathroom with elbow support only, later moved about the department unsupported. Head-atraumatic, normocephalic Eyes- conjunctiva normal, EOMI ,conjugate gaze Ears: grossly normal hearing Nose- no congestion or rhinorrhea Mouth/throat- mucous membranes moist ,oropharynx non-erythematous Neck- supple without glandular enlargement CV- regular rate and rhythmn Resp-no distress, normal respiratory effort,clear to auscultation bilaterally; no discomfort with inspiration /expirations, fields clear GI- soft all quadrants; hyperactive bowel sounds noted through out ( patient denies diarrhea)- patient gets anxious and jumps about expressing discomfort before the examining hand has contact with skin--encouraged To place her hand on mine allowed a general palpation with no evidence of mass. This is now  accomplished. No mass identified. No localized pain Tenderness reported inconsistently but toward RUQ focus when presented GU- not examined MSK- non tender, normal ROM, ambulatory with  Minimal assistance, no gait instability,on /off table solo; rib pressure is not painful,nor is there excess mobility or any indication of fracture Neuro- normal speech and language, no gross focal neurological deficit appreciated,  Skin-warm,dry ,intact; no evidence of bruising or wound abdomen or right ribs Psych-mood and affect grossly normal; speech and behavior grossly normal  ED Course  Procedures (including critical care time)  Labs Review Labs Reviewed  URINE CULTURE - Abnormal; Notable for the following:    Culture MULTIPLE SPECIES PRESENT, SUGGEST RECOLLECTION (*)    All other components within normal limits  URINALYSIS COMPLETEWITH MICROSCOPIC ([facility] ONLY) - Abnormal; Notable for the following:    Hgb urine dipstick TRACE (*)  Protein, ur 30 (*)    Bacteria, UA RARE (*)    Squamous Epithelial / LPF 6-30 (*)    All other components within normal limits  COMPREHENSIVE METABOLIC PANEL - Abnormal; Notable for the following:    Creatinine, Ser 1.09 (*)    Calcium 8.8 (*)    ALT 12 (*)    GFR calc non Af Amer 51 (*)    GFR calc Af Amer 59 (*)    All other components within normal limits    Imaging Review No results found.  Labs were reviewed with Dr Allena Katz - non contributory.non-diagnostic Ms Smolinski had  requests for pain medications, or nerve medications or stomach medications. But could not elaborate at any time what she needed help with. Her exam was unchanged. She was given ibuprofen po and was comforted by it. She was in and out of the bed, moved about the room and halls, and to the bathroom generally unassisted Given her return to c/o RUQ discomfort -xray ordered and  reiewed- no evidence of fracture She was asked if she felt in danger or threatened and she denied. Was asked if she  was fearful of any one in her house which she denies Has been ambulatory in department . Drinking 7 up and now 11:45 hungry for peanut butter and crackers. Ate without difficulty  Patient concerns, exam and labs reviewed with Dr Allena Katz . It is felt patient is stable for discharge. The family is counseled to return to ER of choice or [facility] should She develop increased concerns or findings, develop a fever or other distress. It is shared with them that she may exhibit some diarrhea in the near future as her bowel sounds were hyperactive.  MDM   1. Right upper quadrant pain    Plan: Test/x-ray results and diagnosis reviewed with patient/ family Routine medications as previously ordered.  Recommend supportive treatment with cyclic tylenol and ibuprofen Seek additional medical care if symptoms worsen or are not improving     Rae Halsted, PA-C 10/09/15 2329

## 2015-10-06 ENCOUNTER — Emergency Department
Admission: EM | Admit: 2015-10-06 | Discharge: 2015-10-06 | Disposition: A | Discharge status: Home / Self Care | Payer: Commercial Managed Care - HMO | Attending: Emergency Medicine | Admitting: Emergency Medicine

## 2015-10-06 ENCOUNTER — Encounter: Payer: Self-pay | Admitting: Emergency Medicine

## 2015-10-06 DIAGNOSIS — J45909 Unspecified asthma, uncomplicated: Secondary | ICD-10-CM | POA: Insufficient documentation

## 2015-10-06 DIAGNOSIS — R112 Nausea with vomiting, unspecified: Secondary | ICD-10-CM | POA: Diagnosis not present

## 2015-10-06 DIAGNOSIS — Z79899 Other long term (current) drug therapy: Secondary | ICD-10-CM | POA: Diagnosis not present

## 2015-10-06 DIAGNOSIS — K859 Acute pancreatitis without necrosis or infection, unspecified: Secondary | ICD-10-CM | POA: Insufficient documentation

## 2015-10-06 DIAGNOSIS — M1990 Unspecified osteoarthritis, unspecified site: Secondary | ICD-10-CM | POA: Diagnosis not present

## 2015-10-06 DIAGNOSIS — R1011 Right upper quadrant pain: Secondary | ICD-10-CM | POA: Diagnosis not present

## 2015-10-06 DIAGNOSIS — F17210 Nicotine dependence, cigarettes, uncomplicated: Secondary | ICD-10-CM | POA: Diagnosis not present

## 2015-10-06 DIAGNOSIS — F329 Major depressive disorder, single episode, unspecified: Secondary | ICD-10-CM | POA: Diagnosis not present

## 2015-10-06 DIAGNOSIS — F419 Anxiety disorder, unspecified: Secondary | ICD-10-CM | POA: Diagnosis not present

## 2015-10-06 DIAGNOSIS — R1010 Upper abdominal pain, unspecified: Secondary | ICD-10-CM | POA: Diagnosis present

## 2015-10-06 LAB — URINALYSIS COMPLETE WITH MICROSCOPIC (ARMC ONLY)
BACTERIA UA: NONE SEEN
BILIRUBIN URINE: NEGATIVE
GLUCOSE, UA: NEGATIVE mg/dL
KETONES UR: NEGATIVE mg/dL
LEUKOCYTES UA: NEGATIVE
Nitrite: NEGATIVE
PH: 6 (ref 5.0–8.0)
Protein, ur: NEGATIVE mg/dL
Specific Gravity, Urine: 1.013 (ref 1.005–1.030)

## 2015-10-06 LAB — COMPREHENSIVE METABOLIC PANEL
ALK PHOS: 48 U/L (ref 38–126)
ALT: 13 U/L — ABNORMAL LOW (ref 14–54)
ANION GAP: 6 (ref 5–15)
AST: 20 U/L (ref 15–41)
Albumin: 4.2 g/dL (ref 3.5–5.0)
BUN: 18 mg/dL (ref 6–20)
CALCIUM: 9.1 mg/dL (ref 8.9–10.3)
CO2: 23 mmol/L (ref 22–32)
Chloride: 108 mmol/L (ref 101–111)
Creatinine, Ser: 1.04 mg/dL — ABNORMAL HIGH (ref 0.44–1.00)
GFR calc non Af Amer: 54 mL/min — ABNORMAL LOW (ref >60)
Glucose, Bld: 111 mg/dL — ABNORMAL HIGH (ref 65–99)
POTASSIUM: 4.3 mmol/L (ref 3.5–5.1)
SODIUM: 137 mmol/L (ref 135–145)
TOTAL PROTEIN: 7.2 g/dL (ref 6.5–8.1)
Total Bilirubin: 0.6 mg/dL (ref 0.3–1.2)

## 2015-10-06 LAB — CBC
HCT: 39 % (ref 35.0–47.0)
HEMOGLOBIN: 13.2 g/dL (ref 12.0–16.0)
MCH: 32.6 pg (ref 26.0–34.0)
MCHC: 33.9 g/dL (ref 32.0–36.0)
MCV: 95.9 fL (ref 80.0–100.0)
PLATELETS: 170 10*3/uL (ref 150–440)
RBC: 4.07 MIL/uL (ref 3.80–5.20)
RDW: 15.2 % — AB (ref 11.5–14.5)
WBC: 7.2 10*3/uL (ref 3.6–11.0)

## 2015-10-06 LAB — LIPASE, BLOOD: Lipase: 173 U/L — ABNORMAL HIGH (ref 11–51)

## 2015-10-06 MED ORDER — ONDANSETRON 4 MG PO TBDP: 4.0000 mg | ORAL_TABLET | Freq: Three times a day (TID) | ORAL | Status: DC | PRN

## 2015-10-06 MED ORDER — SODIUM CHLORIDE 0.9 % IV BOLUS (SEPSIS)
1000.0000 mL | Freq: Once | INTRAVENOUS | Status: AC
  Administered 2015-10-06: 1000 mL via INTRAVENOUS

## 2015-10-06 MED ORDER — HYDROMORPHONE HCL 1 MG/ML IJ SOLN
0.5000 mg | Freq: Once | INTRAMUSCULAR | Status: AC
  Administered 2015-10-06: 0.5 mg via INTRAVENOUS
  Filled 2015-10-06: qty 1

## 2015-10-06 MED ORDER — ONDANSETRON HCL 4 MG/2ML IJ SOLN
4.0000 mg | Freq: Once | INTRAMUSCULAR | Status: AC
  Administered 2015-10-06: 4 mg via INTRAVENOUS
  Filled 2015-10-06: qty 2

## 2015-10-06 NOTE — ED Notes (Signed)
Pt presents with abd pain and nausea started last night. No acute distress noted in triage.

## 2015-10-06 NOTE — ED Provider Notes (Signed)
[facility] Emergency Department Provider Note  ____________________________________________  Time seen: Approximately 8:13 AM  I have reviewed the triage vital signs and the nursing notes.   HISTORY  Chief Complaint Abdominal Pain    HPI Deborah [name] is a 70 y.o. female s/p remote chole with a history of PUD, IBS, and multiple ED visits for anxiety presenting with abdominal pain, nausea and vomiting. Patient reports that for the past 3 days she has been having an upper abdominal pain that is "sharp and dull." This was associated with 2 episodes of vomiting that occurred yesterday morning, and she has had persistent nausea but no additional vomiting. She denies any diarrhea or constipation, fever or chills, urinary symptoms. Today she has an elevated lipase, and she has had an elevated lipase in the past but states that she has never been told she has pancreatitis. She denies any significant alcohol intake.   Past Medical History  Diagnosis Date  . Asthma   . Ulcer   . Heart murmur   . Cystitis   . Arthritis   . Depression   . Spastic colon   . IBS (irritable bowel syndrome)   . Osteoporosis   . Anxiety     Patient Active Problem List   Diagnosis Date Noted  . Adjustment disorder with anxious mood 09/26/2015  . Bipolar 1 disorder, depressed (HCC) 09/25/2015  . Tobacco use disorder 09/25/2015  . Anxiety as acute reaction to exceptional stress 07/23/2015  . Mild dementia 05/29/2015  . History of migraine headaches 09/08/2014  . H/O Bell's palsy 09/08/2014  . H/O: HTN (hypertension) 09/08/2014  . Gastroesophageal reflux disease without esophagitis 09/08/2014  . History of asthma 09/08/2014  . 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 Cholecystectomy    . Spine surgery    . Colonoscopy  2002  . Upper gi endoscopy  2011  . Arm surgery Left 2011    fracture repair  . Throat biopsy   2012   . Shoulder surgery    . Elbow surgery    . Breast biopsy      LCIS    Current Outpatient Rx  Name  Route  Sig  Dispense  Refill  . alendronate (FOSAMAX) 70 MG tablet   Oral   Take 1 tablet by mouth once a week. Reported on 09/25/2015      5   . divalproex (DEPAKOTE) 250 MG DR tablet   Oral   Take 500 tablets by mouth 2 (two) times daily.       5   . donepezil (ARICEPT) 5 MG tablet   Oral   Take 5 mg by mouth at bedtime.          Marland Kitchen escitalopram (LEXAPRO) 10 MG tablet   Oral   Take 10 mg by mouth daily.         Marland Kitchen gabapentin (NEURONTIN) 100 MG capsule      1 bid  For 3 days then 2   Bid Patient taking differently: Take 100-200 mg by mouth 2 (two) times daily. 1 bid  For 3 days then 2   Bid   120 capsule   2   . mirtazapine (REMERON) 30 MG tablet   Oral   Take 1 tablet (30 mg total) by mouth at bedtime.   30 tablet   3   . ondansetron (ZOFRAN ODT) 4 MG disintegrating tablet   Oral   Take  1 tablet (4 mg total) by mouth every 8 (eight) hours as needed for nausea or vomiting.   20 tablet   0   . QUEtiapine (SEROQUEL) 50 MG tablet   Oral   Take 1-2 tablets (50-100 mg total) by mouth 2 (two) times daily. Take 1 tablet (50mg ) in the morning and 2 tablets (100mg ) at bedtime   60 tablet   3   . ranitidine (ZANTAC) 150 MG tablet   Oral   Take 1 tablet (150 mg total) by mouth at bedtime.   30 tablet   0   . traZODone (DESYREL) 100 MG tablet   Oral   Take 100 mg by mouth at bedtime.           Allergies Dristan cold and Prednisone  Family History  Problem Relation Age of Onset  . Cancer Sister     ovarian  . Cancer Maternal Aunt     breast  . Cancer Maternal Aunt     breast  . Heart attack Mother     Social History Social History  Substance Use Topics  . Smoking status: Current Every Day Smoker -- 1.00 packs/day for 20 years    Types: Cigarettes    Start date: 03/31/1977  . Smokeless tobacco: Never Used  . Alcohol Use: No     Comment:  Ocassional    Review of Systems Constitutional: No fever/chills.No lightheadedness or syncope. Eyes: No visual changes. ENT: No sore throat. No congestion or rhinorrhea. Cardiovascular: Denies chest pain. Denies palpitations. Respiratory: Denies shortness of breath.  No cough. Gastrointestinal: Positive upper abdominal pain.  Positive nausea, positive vomiting.  No diarrhea.  No constipation. Genitourinary: Negative for dysuria. Musculoskeletal: Negative for back pain. Skin: Negative for rash. Neurological: Negative for headaches. No focal numbness, tingling or weakness.  Psychiatric:No acute psychiatric complaints today. 10-point ROS otherwise negative.  ____________________________________________   PHYSICAL EXAM:  VITAL SIGNS: ED Triage Vitals  Enc Vitals Group     BP 10/06/15 0713 122/64 mmHg     Pulse Rate 10/06/15 0713 82     Resp 10/06/15 0713 16     Temp 10/06/15 0713 98.4 F (36.9 C)     Temp Source 10/06/15 0713 Oral     SpO2 10/06/15 0713 97 %     Weight 10/06/15 0713 115 lb (52.164 kg)     Height 10/06/15 0713 5\' 2"  (1.575 m)     Head Cir --      Peak Flow --      Pain Score 10/06/15 0713 9     Pain Loc --      Pain Edu? --      Excl. in GC? --     Constitutional: Alert and oriented. Well appearing and in no acute distress. Answers questions appropriately. Eyes: Conjunctivae are normal.  EOMI. No scleral icterus. Head: Atraumatic. Nose: No congestion/rhinnorhea. Mouth/Throat: Mucous membranes are Dry.  Neck: No stridor.  Supple.  No JVD. No meningismus. Cardiovascular: Normal rate, regular rhythm. No murmurs, rubs or gallops.  Respiratory: Normal respiratory effort.  No accessory muscle use or retractions. Lungs CTAB.  No wheezes, rales or ronchi. Gastrointestinal: Abdomen is soft and nondistended. The patient has tenderness to palpation in the epigastrium and right upper quadrant with no Murphy sign.  No guarding or rebound.  No peritoneal  signs. Musculoskeletal: No LE edema. No ttp in the calves or palpable cords.  Negative Homan's sign. Neurologic:  A&Ox3.  Speech is clear.  Face and smile are  symmetric.  EOMI.  Moves all extremities well. Skin:  Skin is warm, dry and intact. No rash noted. Psychiatric: Mood is normal with a mildly anxious affect.  ____________________________________________   LABS (all labs ordered are listed, but only abnormal results are displayed)  Labs Reviewed  LIPASE, BLOOD - Abnormal; Notable for the following:    Lipase 173 (*)    All other components within normal limits  COMPREHENSIVE METABOLIC PANEL - Abnormal; Notable for the following:    Glucose, Bld 111 (*)    Creatinine, Ser 1.04 (*)    ALT 13 (*)    GFR calc non Af Amer 54 (*)    All other components within normal limits  CBC - Abnormal; Notable for the following:    RDW 15.2 (*)    All other components within normal limits  URINALYSIS COMPLETEWITH MICROSCOPIC ([facility] ONLY) - Abnormal; Notable for the following:    Color, Urine YELLOW (*)    APPearance CLEAR (*)    Hgb urine dipstick 1+ (*)    Squamous Epithelial / LPF 0-5 (*)    All other components within normal limits   ____________________________________________  EKG  ED ECG REPORT I, Rockne MenghiniNorman, Anne-Caroline, the attending physician, personally viewed and interpreted this ECG.   Date: 10/06/2015  EKG Time: 837  Rate: 79  Rhythm: normal sinus rhythm  Axis: normal  Intervals:none  ST&T Change: No STEMI  ____________________________________________  RADIOLOGY  No results found.  ____________________________________________   PROCEDURES  Procedure(s) performed: None  Critical Care performed: No ____________________________________________   INITIAL IMPRESSION / ASSESSMENT AND PLAN / ED COURSE  Pertinent labs & imaging results that were available during my care of the patient were reviewed by me and considered in my medical decision making (see chart  for details).  70 y.o. female presenting with 3 days of progressively worsening upper abdominal pain, nausea with now resolved vomiting, and an elevated lipase. The patient does not appear to be an alcohol abuser, and she does not have a gallbladder, so she may have idiopathic pancreatitis although she has never carried this diagnosis before. I would also consider polypharmacy or some of her medications as a possible cause for pancreatitis. At this time, the patient is afebrile, well-appearing, with mild symptoms so additional imaging is not warranted, but we will give her fluids and symptomatic treatment and reevaluate the patient for final disposition.  ____________________________________________  FINAL CLINICAL IMPRESSION(S) / ED DIAGNOSES  Final diagnoses:  Acute pancreatitis, unspecified pancreatitis type  Non-intractable vomiting with nausea, vomiting of unspecified type  Anxiety      NEW MEDICATIONS STARTED DURING THIS VISIT:  Discharge Medication List as of 10/06/2015  9:43 AM       Rockne MenghiniAnne-Caroline Kenzlee Fishburn, MD 10/06/15 1554

## 2015-10-06 NOTE — Discharge Instructions (Signed)
Please make a follow-up appointment with your primary care physician for reevaluation in the next 1-2 days.  Drink plenty of fluid to stay well-hydrated. Please take a clear liquid diet for the next 3 days, then advance your diet to a bland BRAT diet as tolerated.  Immediately return to the emergency department if you develop severe pain, fever, inability to keep down fluids, lightheadedness or fainting, or any other symptoms concerning to you.  Acute Pancreatitis Acute pancreatitis is a disease in which the pancreas becomes suddenly inflamed. The pancreas is a large gland located behind your stomach. The pancreas produces enzymes that help digest food. The pancreas also releases the hormones glucagon and insulin that help regulate blood sugar. Damage to the pancreas occurs when the digestive enzymes from the pancreas are activated and begin attacking the pancreas before being released into the intestine. Most acute attacks last a couple of days and can cause serious complications. Some people become dehydrated and develop low blood pressure. In severe cases, bleeding into the pancreas can lead to shock and can be life-threatening. The lungs, heart, and kidneys may fail. CAUSES  Pancreatitis can happen to anyone. In some cases, the cause is unknown. Most cases are caused by:  Alcohol abuse.  Gallstones. Other less common causes are:  Certain medicines.  Exposure to certain chemicals.  Infection.  Damage caused by an accident (trauma).  Abdominal surgery. SYMPTOMS   Pain in the upper abdomen that may radiate to the back.  Tenderness and swelling of the abdomen.  Nausea and vomiting. DIAGNOSIS  Your caregiver will perform a physical exam. Blood and stool tests may be done to confirm the diagnosis. Imaging tests may also be done, such as X-rays, CT scans, or an ultrasound of the abdomen. TREATMENT  Treatment usually requires a stay in the hospital. Treatment may include:  Pain  medicine.  Fluid replacement through an intravenous line (IV).  Placing a tube in the stomach to remove stomach contents and control vomiting.  Not eating for 3 or 4 days. This gives your pancreas a rest, because enzymes are not being produced that can cause further damage.  Antibiotic medicines if your condition is caused by an infection.  Surgery of the pancreas or gallbladder. HOME CARE INSTRUCTIONS   Follow the diet advised by your caregiver. This may involve avoiding alcohol and decreasing the amount of fat in your diet.  Eat smaller, more frequent meals. This reduces the amount of digestive juices the pancreas produces.  Drink enough fluids to keep your urine clear or pale yellow.  Only take over-the-counter or prescription medicines as directed by your caregiver.  Avoid drinking alcohol if it caused your condition.  Do not smoke.  Get plenty of rest.  Check your blood sugar at home as directed by your caregiver.  Keep all follow-up appointments as directed by your caregiver. SEEK MEDICAL CARE IF:   You do not recover as quickly as expected.  You develop new or worsening symptoms.  You have persistent pain, weakness, or nausea.  You recover and then have another episode of pain. SEEK IMMEDIATE MEDICAL CARE IF:   You are unable to eat or keep fluids down.  Your pain becomes severe.  You have a fever or persistent symptoms for more than 2 to 3 days.  You have a fever and your symptoms suddenly get worse.  Your skin or the white part of your eyes turn yellow (jaundice).  You develop vomiting.  You feel dizzy, or you faint.  Your blood sugar is high (over 300 mg/dL). MAKE SURE YOU:   Understand these instructions.  Will watch your condition.  Will get help right away if you are not doing well or get worse.   This information is not intended to replace advice given to you by your health care provider. Make sure you discuss any questions you have with  your health care provider.   Document Released: 05/03/2005 Document Revised: 11/02/2011 Document Reviewed: 08/12/2011 Elsevier Interactive Patient Education [employer]

## 2015-10-06 NOTE — ED Notes (Signed)
Pt verbalized understanding of discharge instructions and denied questions. E-signature box not working. 

## 2015-10-07 ENCOUNTER — Emergency Department
Admission: EM | Admit: 2015-10-07 | Discharge: 2015-10-07 | Disposition: A | Discharge status: Home / Self Care | Payer: Commercial Managed Care - HMO

## 2015-10-07 LAB — URINE CULTURE

## 2015-10-08 ENCOUNTER — Ambulatory Visit (HOSPITAL_COMMUNITY): Payer: Self-pay | Admitting: Psychiatry

## 2015-10-09 DIAGNOSIS — J449 Chronic obstructive pulmonary disease, unspecified: Secondary | ICD-10-CM | POA: Diagnosis not present

## 2015-10-09 DIAGNOSIS — F411 Generalized anxiety disorder: Secondary | ICD-10-CM | POA: Diagnosis not present

## 2015-10-10 ENCOUNTER — Ambulatory Visit
Admission: EM | Admit: 2015-10-10 | Discharge: 2015-10-10 | Disposition: A | Discharge status: Home / Self Care | Payer: Commercial Managed Care - HMO | Attending: Internal Medicine | Admitting: Internal Medicine

## 2015-10-10 ENCOUNTER — Encounter: Payer: Self-pay | Admitting: *Deleted

## 2015-10-10 ENCOUNTER — Ambulatory Visit (INDEPENDENT_AMBULATORY_CARE_PROVIDER_SITE_OTHER): Payer: Commercial Managed Care - HMO | Admitting: Psychiatry

## 2015-10-10 ENCOUNTER — Encounter (HOSPITAL_COMMUNITY): Payer: Self-pay | Admitting: Psychiatry

## 2015-10-10 VITALS — BP 110/68 | HR 95 | Ht 63.0 in | Wt 118.4 lb

## 2015-10-10 DIAGNOSIS — R450 Nervousness: Secondary | ICD-10-CM | POA: Diagnosis not present

## 2015-10-10 DIAGNOSIS — F3341 Major depressive disorder, recurrent, in partial remission: Secondary | ICD-10-CM

## 2015-10-10 DIAGNOSIS — IMO0001 Reserved for inherently not codable concepts without codable children: Secondary | ICD-10-CM

## 2015-10-10 NOTE — ED Notes (Signed)
"  My nerves are shot". Pt is very anxious and repeatedly startles and jumps. Denies any pain.
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
## 2015-10-10 NOTE — Discharge Instructions (Signed)
followup with behavioral health as per visit note from 10/10/15.
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
## 2015-10-15 ENCOUNTER — Encounter: Payer: Self-pay | Admitting: *Deleted

## 2015-10-15 ENCOUNTER — Ambulatory Visit
Admission: EM | Admit: 2015-10-15 | Discharge: 2015-10-15 | Disposition: A | Discharge status: Home / Self Care | Payer: Commercial Managed Care - HMO | Attending: Family Medicine | Admitting: Family Medicine

## 2015-10-15 DIAGNOSIS — S50812A Abrasion of left forearm, initial encounter: Secondary | ICD-10-CM | POA: Diagnosis not present

## 2015-10-15 DIAGNOSIS — Z23 Encounter for immunization: Secondary | ICD-10-CM

## 2015-10-15 DIAGNOSIS — W548XXA Other contact with dog, initial encounter: Secondary | ICD-10-CM

## 2015-10-15 MED ORDER — TETANUS-DIPHTH-ACELL PERTUSSIS 5-2.5-18.5 LF-MCG/0.5 IM SUSP
0.5000 mL | Freq: Once | INTRAMUSCULAR | Status: AC
  Administered 2015-10-15: 0.5 mL via INTRAMUSCULAR

## 2015-10-15 NOTE — ED Notes (Signed)
Patient's left lower forearm was scratched accidentally by her dog 1 hour before arrival to [facility]. Skin abrasion is visible on her left forearm.

## 2015-10-15 NOTE — Discharge Instructions (Signed)
Wound Care Taking care of your wound properly can help to prevent pain and infection. It can also help your wound to heal more quickly.  HOW TO CARE FOR YOUR WOUND  Take or apply over-the-counter and prescription medicines only as told by your health care provider.  If you were prescribed antibiotic medicine, take or apply it as told by your health care provider. Do not stop using the antibiotic even if your condition improves.  Clean the wound each day or as told by your health care provider.  Wash the wound with mild soap and water.  Rinse the wound with water to remove all soap.  Pat the wound dry with a clean towel. Do not rub it.  There are many different ways to close and cover a wound. For example, a wound can be covered with stitches (sutures), skin glue, or adhesive strips. Follow instructions from your health care provider about:  How to take care of your wound.  When and how you should change your bandage (dressing).  When you should remove your dressing.  Removing whatever was used to close your wound.  Check your wound every day for signs of infection. Watch for:  Redness, swelling, or pain.  Fluid, blood, or pus.  Keep the dressing dry until your health care provider says it can be removed. Do not take baths, swim, use a hot tub, or do anything that would put your wound underwater until your health care provider approves.  Raise (elevate) the injured area above the level of your heart while you are sitting or lying down.  Do not scratch or pick at the wound.  Keep all follow-up visits as told by your health care provider. This is important. SEEK MEDICAL CARE IF:  You received a tetanus shot and you have swelling, severe pain, redness, or bleeding at the injection site.  You have a fever.  Your pain is not controlled with medicine.  You have increased redness, swelling, or pain at the site of your wound.  You have fluid, blood, or pus coming from your  wound.  You notice a bad smell coming from your wound or your dressing. SEEK IMMEDIATE MEDICAL CARE IF:  You have a red streak going away from your wound.   This information is not intended to replace advice given to you by your health care provider. Make sure you discuss any questions you have with your health care provider.   Document Released: 02/10/2008 Document Revised: 09/17/2014 Document Reviewed: 04/29/2014 Elsevier Interactive Patient Education 2016 [employer]  Abrasion An abrasion is a cut or scrape on the outer surface of your skin. An abrasion does not extend through all of the layers of your skin. It is important to care for your abrasion properly to prevent infection. CAUSES Most abrasions are caused by falling on or gliding across the ground or another surface. When your skin rubs on something, the outer and inner layer of skin rubs off.  SYMPTOMS A cut or scrape is the main symptom of this condition. The scrape may be bleeding, or it may appear red or pink. If there was an associated fall, there may be an underlying bruise. DIAGNOSIS An abrasion is diagnosed with a physical exam. TREATMENT Treatment for this condition depends on how large and deep the abrasion is. Usually, your abrasion will be cleaned with water and mild soap. This removes any dirt or debris that may be stuck. An antibiotic ointment may be applied to the abrasion to help  prevent infection. A bandage (dressing) may be placed on the abrasion to keep it clean. You may also need a tetanus shot. HOME CARE INSTRUCTIONS Medicines  Take or apply medicines only as directed by your health care provider.  If you were prescribed an antibiotic ointment, finish all of it even if you start to feel better. Wound Care  Clean the wound with mild soap and water 2-3 times per day or as directed by your health care provider. Pat your wound dry with a clean towel. Do not rub it.  There are many different ways to close  and cover a wound. Follow instructions from your health care provider about:  Wound care.  Dressing changes and removal.  Check your wound every day for signs of infection. Watch for:  Redness, swelling, or pain.  Fluid, blood, or pus. General Instructions  Keep the dressing dry as directed by your health care provider. Do not take baths, swim, use a hot tub, or do anything that would put your wound underwater until your health care provider approves.  If there is swelling, raise (elevate) the injured area above the level of your heart while you are sitting or lying down.  Keep all follow-up visits as directed by your health care provider. This is important. SEEK MEDICAL CARE IF:  You received a tetanus shot and you have swelling, severe pain, redness, or bleeding at the injection site.  Your pain is not controlled with medicine.  You have increased redness, swelling, or pain at the site of your wound.     SEEK IMMEDIATE MEDICAL CARE IF:  You have a red streak going away from your wound.  You have a fever.  You have fluid, blood, or pus coming from your wound.  You notice a bad smell coming from your wound or your dressing.   This information is not intended to replace advice given to you by your health care provider. Make sure you discuss any questions you have with your health care provider.   Document Released: 02/10/2005 Document Revised: 01/22/2015 Document Reviewed: 05/01/2014 Elsevier Interactive Patient Education 2016 [employer]    USE TRIPLE ANTIBIOTIC OINTMENT TWICE DAILY   FOLLOW UP IF SIGNS OF INFECTION DEVELOP

## 2015-10-16 DIAGNOSIS — F410 Panic disorder [episodic paroxysmal anxiety] without agoraphobia: Secondary | ICD-10-CM | POA: Diagnosis not present

## 2015-10-16 DIAGNOSIS — F332 Major depressive disorder, recurrent severe without psychotic features: Secondary | ICD-10-CM | POA: Diagnosis not present

## 2015-10-16 DIAGNOSIS — S40812A Abrasion of left upper arm, initial encounter: Secondary | ICD-10-CM | POA: Diagnosis not present

## 2015-10-17 DIAGNOSIS — F332 Major depressive disorder, recurrent severe without psychotic features: Secondary | ICD-10-CM | POA: Diagnosis not present

## 2015-10-17 DIAGNOSIS — F410 Panic disorder [episodic paroxysmal anxiety] without agoraphobia: Secondary | ICD-10-CM | POA: Diagnosis not present

## 2015-10-20 ENCOUNTER — Emergency Department
Admission: EM | Admit: 2015-10-20 | Discharge: 2015-10-20 | Discharge status: Against Medical Advice | Payer: Commercial Managed Care - HMO | Attending: Emergency Medicine | Admitting: Emergency Medicine

## 2015-10-20 DIAGNOSIS — F0281 Dementia in other diseases classified elsewhere with behavioral disturbance: Secondary | ICD-10-CM | POA: Diagnosis not present

## 2015-10-20 DIAGNOSIS — R112 Nausea with vomiting, unspecified: Secondary | ICD-10-CM | POA: Insufficient documentation

## 2015-10-20 DIAGNOSIS — F3131 Bipolar disorder, current episode depressed, mild: Secondary | ICD-10-CM | POA: Diagnosis not present

## 2015-10-20 DIAGNOSIS — I10 Essential (primary) hypertension: Secondary | ICD-10-CM | POA: Insufficient documentation

## 2015-10-20 DIAGNOSIS — G510 Bell's palsy: Secondary | ICD-10-CM | POA: Diagnosis not present

## 2015-10-20 DIAGNOSIS — F331 Major depressive disorder, recurrent, moderate: Secondary | ICD-10-CM | POA: Diagnosis not present

## 2015-10-20 DIAGNOSIS — J45909 Unspecified asthma, uncomplicated: Secondary | ICD-10-CM | POA: Diagnosis not present

## 2015-10-20 DIAGNOSIS — Z853 Personal history of malignant neoplasm of breast: Secondary | ICD-10-CM | POA: Insufficient documentation

## 2015-10-20 DIAGNOSIS — R55 Syncope and collapse: Secondary | ICD-10-CM

## 2015-10-20 DIAGNOSIS — M1990 Unspecified osteoarthritis, unspecified site: Secondary | ICD-10-CM | POA: Insufficient documentation

## 2015-10-20 DIAGNOSIS — M810 Age-related osteoporosis without current pathological fracture: Secondary | ICD-10-CM | POA: Insufficient documentation

## 2015-10-20 DIAGNOSIS — R6884 Jaw pain: Secondary | ICD-10-CM | POA: Diagnosis not present

## 2015-10-20 DIAGNOSIS — Z79899 Other long term (current) drug therapy: Secondary | ICD-10-CM | POA: Diagnosis not present

## 2015-10-20 DIAGNOSIS — F419 Anxiety disorder, unspecified: Secondary | ICD-10-CM | POA: Diagnosis not present

## 2015-10-20 DIAGNOSIS — F17210 Nicotine dependence, cigarettes, uncomplicated: Secondary | ICD-10-CM | POA: Insufficient documentation

## 2015-10-20 DIAGNOSIS — G308 Other Alzheimer's disease: Secondary | ICD-10-CM | POA: Diagnosis not present

## 2015-10-20 DIAGNOSIS — Z9114 Patient's other noncompliance with medication regimen: Secondary | ICD-10-CM | POA: Diagnosis not present

## 2015-10-20 MED ORDER — HYDROXYZINE HCL 25 MG PO TABS
25.0000 mg | ORAL_TABLET | Freq: Once | ORAL | Status: AC
  Administered 2015-10-20: 25 mg via ORAL
  Filled 2015-10-20: qty 1

## 2015-10-20 MED ORDER — GI COCKTAIL ~~LOC~~
30.0000 mL | Freq: Once | ORAL | Status: DC
  Filled 2015-10-20: qty 30

## 2015-10-20 MED ORDER — ONDANSETRON 4 MG PO TBDP
4.0000 mg | ORAL_TABLET | Freq: Once | ORAL | Status: AC
  Administered 2015-10-20: 4 mg via ORAL
  Filled 2015-10-20: qty 1

## 2015-10-20 MED ORDER — CLINDAMYCIN HCL 150 MG PO CAPS
300.0000 mg | ORAL_CAPSULE | Freq: Once | ORAL | Status: DC
  Filled 2015-10-20: qty 2
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
## 2015-10-20 NOTE — ED Notes (Signed)
Pt left AMA.  Pt states that she feels better and just wants to go home and get some sleep.  Pt states that she will follow up with her primary doctor to get something stronger for her nerves.  Pt's daughter with pt and driving patient home.  Pt in no acute distress upon leaving.
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
## 2015-10-22 ENCOUNTER — Ambulatory Visit
Admission: EM | Admit: 2015-10-22 | Discharge: 2015-10-22 | Disposition: A | Discharge status: Hospice | Payer: Commercial Managed Care - HMO | Attending: Family Medicine | Admitting: Family Medicine

## 2015-10-22 DIAGNOSIS — R6884 Jaw pain: Secondary | ICD-10-CM

## 2015-10-22 NOTE — ED Notes (Signed)
When asked about her daily medications, pt states she doesn't take them all the time.
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
## 2015-10-23 DIAGNOSIS — R6884 Jaw pain: Secondary | ICD-10-CM | POA: Diagnosis not present

## 2015-10-23 DIAGNOSIS — F411 Generalized anxiety disorder: Secondary | ICD-10-CM | POA: Diagnosis not present

## 2015-10-29 ENCOUNTER — Ambulatory Visit (HOSPITAL_COMMUNITY): Payer: Self-pay | Admitting: Psychiatry

## 2015-10-30 ENCOUNTER — Emergency Department: Payer: Commercial Managed Care - HMO

## 2015-10-30 ENCOUNTER — Encounter: Payer: Self-pay | Admitting: Emergency Medicine

## 2015-10-30 ENCOUNTER — Emergency Department
Admission: EM | Admit: 2015-10-30 | Discharge: 2015-10-30 | Disposition: A | Discharge status: Home / Self Care | Payer: Commercial Managed Care - HMO | Attending: Emergency Medicine | Admitting: Emergency Medicine

## 2015-10-30 DIAGNOSIS — R110 Nausea: Secondary | ICD-10-CM | POA: Diagnosis not present

## 2015-10-30 DIAGNOSIS — M810 Age-related osteoporosis without current pathological fracture: Secondary | ICD-10-CM | POA: Diagnosis not present

## 2015-10-30 DIAGNOSIS — M1990 Unspecified osteoarthritis, unspecified site: Secondary | ICD-10-CM | POA: Insufficient documentation

## 2015-10-30 DIAGNOSIS — F17210 Nicotine dependence, cigarettes, uncomplicated: Secondary | ICD-10-CM | POA: Diagnosis not present

## 2015-10-30 DIAGNOSIS — Z79899 Other long term (current) drug therapy: Secondary | ICD-10-CM | POA: Insufficient documentation

## 2015-10-30 DIAGNOSIS — G8929 Other chronic pain: Secondary | ICD-10-CM | POA: Diagnosis not present

## 2015-10-30 DIAGNOSIS — R1011 Right upper quadrant pain: Secondary | ICD-10-CM | POA: Diagnosis not present

## 2015-10-30 DIAGNOSIS — J45909 Unspecified asthma, uncomplicated: Secondary | ICD-10-CM | POA: Insufficient documentation

## 2015-10-30 DIAGNOSIS — R1010 Upper abdominal pain, unspecified: Secondary | ICD-10-CM

## 2015-10-30 LAB — COMPREHENSIVE METABOLIC PANEL
ALT: 13 U/L — AB (ref 14–54)
AST: 15 U/L (ref 15–41)
Albumin: 3.7 g/dL (ref 3.5–5.0)
Alkaline Phosphatase: 43 U/L (ref 38–126)
Anion gap: 7 (ref 5–15)
BUN: 26 mg/dL — AB (ref 6–20)
CHLORIDE: 110 mmol/L (ref 101–111)
CO2: 23 mmol/L (ref 22–32)
CREATININE: 1.15 mg/dL — AB (ref 0.44–1.00)
Calcium: 8.5 mg/dL — ABNORMAL LOW (ref 8.9–10.3)
GFR calc Af Amer: 55 mL/min — ABNORMAL LOW (ref >60)
GFR, EST NON AFRICAN AMERICAN: 47 mL/min — AB (ref >60)
Glucose, Bld: 101 mg/dL — ABNORMAL HIGH (ref 65–99)
Potassium: 4.3 mmol/L (ref 3.5–5.1)
Sodium: 140 mmol/L (ref 135–145)
Total Bilirubin: 0.5 mg/dL (ref 0.3–1.2)
Total Protein: 6.6 g/dL (ref 6.5–8.1)

## 2015-10-30 LAB — CBC
HCT: 37 % (ref 35.0–47.0)
Hemoglobin: 12.5 g/dL (ref 12.0–16.0)
MCH: 31.9 pg (ref 26.0–34.0)
MCHC: 33.7 g/dL (ref 32.0–36.0)
MCV: 94.7 fL (ref 80.0–100.0)
PLATELETS: 148 10*3/uL — AB (ref 150–440)
RBC: 3.91 MIL/uL (ref 3.80–5.20)
RDW: 14.9 % — AB (ref 11.5–14.5)
WBC: 8.5 10*3/uL (ref 3.6–11.0)

## 2015-10-30 LAB — TROPONIN I: Troponin I: <0.03 ng/mL (ref <0.031)

## 2015-10-30 LAB — LIPASE, BLOOD: LIPASE: 39 U/L (ref 11–51)

## 2015-10-30 MED ORDER — METOCLOPRAMIDE HCL 10 MG PO TABS: 10.0000 mg | ORAL_TABLET | Freq: Four times a day (QID) | ORAL | Status: DC | PRN

## 2015-10-30 MED ORDER — GI COCKTAIL ~~LOC~~
30.0000 mL | Freq: Once | ORAL | Status: AC
  Administered 2015-10-30: 30 mL via ORAL
  Filled 2015-10-30: qty 30

## 2015-10-30 MED ORDER — OMEPRAZOLE 40 MG PO CPDR: 40.0000 mg | DELAYED_RELEASE_CAPSULE | Freq: Every day | ORAL | Status: DC

## 2015-10-30 MED ORDER — ONDANSETRON HCL 4 MG/2ML IJ SOLN
4.0000 mg | Freq: Once | INTRAMUSCULAR | Status: AC
  Administered 2015-10-30: 4 mg via INTRAVENOUS
  Filled 2015-10-30: qty 2

## 2015-10-30 NOTE — ED Provider Notes (Signed)
[facility] Emergency Department Provider Note   ____________________________________________  Time seen: Approximately 420 AM  I have reviewed the triage vital signs and the nursing notes.   HISTORY  Chief Complaint Abdominal Pain   HPI Deborah [name] is a 70 y.o. female with a history of IBS and chronic upper abdominal pain was presented to the emergency department today with upper abdominal pain which she describes a sharp and a 10 out of 10. She said it started yesterday at about 5 PM and has been steadily worsening. She describes nausea but no vomiting. No diarrhea. Denies drinking. Denies any radiation of pain. Denies shortness breath or chest pain. Patient says that she is supposed be on Prilosec and just started taking it again yesterday. Did not provide any relief.   Past Medical History  Diagnosis Date  . Asthma   . Ulcer   . Heart murmur   . Cystitis   . Arthritis   . Depression   . Spastic colon   . IBS (irritable bowel syndrome)   . Osteoporosis   . Anxiety     Patient Active Problem List   Diagnosis Date Noted  . Adjustment disorder with anxious mood 09/26/2015  . Bipolar 1 disorder, depressed (HCC) 09/25/2015  . Tobacco use disorder 09/25/2015  . Anxiety as acute reaction to exceptional stress 07/23/2015  . Mild dementia 05/29/2015  . History of migraine headaches 09/08/2014  . H/O Bell's palsy 09/08/2014  . H/O: HTN (hypertension) 09/08/2014  . Gastroesophageal reflux disease without esophagitis 09/08/2014  . History of asthma 09/08/2014  . 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 Cholecystectomy    . Spine surgery    . Colonoscopy  2002  . Upper gi endoscopy  2011  . Arm surgery Left 2011    fracture repair  . Throat biopsy   2012  . Shoulder surgery    . Elbow surgery    . Breast biopsy      LCIS    Current Outpatient Rx  Name  Route  Sig  Dispense   Refill  . alendronate (FOSAMAX) 70 MG tablet   Oral   Take 1 tablet by mouth once a week. Reported on 09/25/2015      5   . divalproex (DEPAKOTE) 250 MG DR tablet   Oral   Take 500 tablets by mouth 2 (two) times daily.       5   . donepezil (ARICEPT) 5 MG tablet   Oral   Take 5 mg by mouth at bedtime.          Marland Kitchen. escitalopram (LEXAPRO) 10 MG tablet   Oral   Take 10 mg by mouth daily.         Marland Kitchen. gabapentin (NEURONTIN) 100 MG capsule      1 bid  For 3 days then 2   Bid Patient taking differently: Take 100-200 mg by mouth 2 (two) times daily. 1 bid  For 3 days then 2   Bid   120 capsule   2   . mirtazapine (REMERON) 30 MG tablet   Oral   Take 1 tablet (30 mg total) by mouth at bedtime.   30 tablet   3   . ondansetron (ZOFRAN ODT) 4 MG disintegrating tablet   Oral   Take 1 tablet (4 mg total) by mouth every 8 (eight) hours as needed for nausea or vomiting.   20  tablet   0   . QUEtiapine (SEROQUEL) 50 MG tablet   Oral   Take 1-2 tablets (50-100 mg total) by mouth 2 (two) times daily. Take 1 tablet (50mg ) in the morning and 2 tablets (100mg ) at bedtime   60 tablet   3   . ranitidine (ZANTAC) 150 MG tablet   Oral   Take 1 tablet (150 mg total) by mouth at bedtime.   30 tablet   0   . traZODone (DESYREL) 100 MG tablet   Oral   Take 100 mg by mouth at bedtime.           Allergies Dristan cold and Prednisone  Family History  Problem Relation Age of Onset  . Cancer Sister     ovarian  . Cancer Maternal Aunt     breast  . Cancer Maternal Aunt     breast  . Heart attack Mother     Social History Social History  Substance Use Topics  . Smoking status: Current Every Day Smoker -- 1.00 packs/day for 20 years    Types: Cigarettes    Start date: 03/31/1977  . Smokeless tobacco: Never Used  . Alcohol Use: No     Comment: Ocassional    Review of Systems Constitutional: No fever/chills Eyes: No visual changes. ENT: No sore throat. Cardiovascular:  Denies chest pain. Respiratory: Denies shortness of breath. Gastrointestinal:  no vomiting.  No diarrhea.  No constipation. Genitourinary: Negative for dysuria. Musculoskeletal: Negative for back pain. Skin: Negative for rash. Neurological: Negative for headaches, focal weakness or numbness.  10-point ROS otherwise negative.  ____________________________________________   PHYSICAL EXAM:  VITAL SIGNS: ED Triage Vitals  Enc Vitals Group     BP 10/30/15 0321 90/48 mmHg     Pulse Rate 10/30/15 0321 72     Resp 10/30/15 0321 16     Temp 10/30/15 0321 98.2 F (36.8 C)     Temp Source 10/30/15 0321 Oral     SpO2 10/30/15 0321 97 %     Weight 10/30/15 0321 115 lb (52.164 kg)     Height 10/30/15 0321 5\' 3"  (1.6 m)     Head Cir --      Peak Flow --      Pain Score 10/30/15 0322 10     Pain Loc --      Pain Edu? --      Excl. in GC? --     Constitutional: Alert and oriented. Well appearing and in no acute distress. Eyes: Conjunctivae are normal. PERRL. EOMI. Head: Atraumatic. Nose: No congestion/rhinnorhea. Mouth/Throat: Mucous membranes are moist.   Neck: No stridor.   Cardiovascular: Normal rate, regular rhythm. Grossly normal heart sounds.  Good peripheral circulation. Respiratory: Normal respiratory effort.  No retractions. Lungs CTAB. Gastrointestinal: Soft With moderate right upper quadrant as well as epigastric tenderness palpation. Tenderness is distractible and when I'm able to change the subject of conversation and still palpate the abdomen the patient does not appear in any pain. No distention. No CVA tenderness. Musculoskeletal: No lower extremity tenderness nor edema.  No joint effusions. Neurologic:  Normal speech and language. No gross focal neurologic deficits are appreciated.  Skin:  Skin is warm, dry and intact. No rash noted. Psychiatric: Mood and affect are normal. Speech and behavior are normal.  ____________________________________________   LABS (all  labs ordered are listed, but only abnormal results are displayed)  Labs Reviewed  COMPREHENSIVE METABOLIC PANEL - Abnormal; Notable for the following:  Glucose, Bld 101 (*)    BUN 26 (*)    Creatinine, Ser 1.15 (*)    Calcium 8.5 (*)    ALT 13 (*)    GFR calc non Af Amer 47 (*)    GFR calc Af Amer 55 (*)    All other components within normal limits  CBC - Abnormal; Notable for the following:    RDW 14.9 (*)    Platelets 148 (*)    All other components within normal limits  LIPASE, BLOOD  TROPONIN I  URINALYSIS COMPLETEWITH MICROSCOPIC ([facility] ONLY)   ____________________________________________  EKG  ED ECG REPORT I, Arelia Longest, the attending physician, personally viewed and interpreted this ECG.   Date: 10/30/2015  EKG Time: 409  Rate: 68  Rhythm: normal sinus rhythm  Axis: Normal axis  Intervals:none  ST&T Change: No ST segment elevation or depression. No abnormal T-wave inversion.  ____________________________________________  RADIOLOGY  DG Chest 1 View (Final result) Result time: 10/30/15 05:06:55   Final result by Rad Results In Interface (10/30/15 05:06:55)   Narrative:   CLINICAL DATA: Right upper quadrant abdominal pain and nausea since yesterday.  EXAM: CHEST 1 VIEW  COMPARISON: 10/05/2015  FINDINGS: Normal heart size and pulmonary vascularity. Emphysematous changes in the upper lungs. No focal airspace disease or consolidation. No blunting of costophrenic angles. No pneumothorax. Calcification of the aorta. Postoperative changes in the right humerus and left shoulder.  IMPRESSION: Emphysematous changes in the lungs. No evidence of active pulmonary disease.   Electronically Signed By: Burman Nieves M.D. On: 10/30/2015 05:06    ____________________________________________   PROCEDURES  ____________________________________________   INITIAL IMPRESSION / ASSESSMENT AND PLAN / ED COURSE  Pertinent labs & imaging  results that were available during my care of the patient were reviewed by me and considered in my medical decision making (see chart for details).  ----------------------------------------- 5:54 AM on 10/30/2015 -----------------------------------------  Patient resting comfortably at this time. No episodes of vomiting throughout the emergency department stay. Her labs are very reassuring. She told the nurse that she urinated already during her stay but "did not catch it." I believe that her abdominal pain is likely an ongoing chronic issue. She is very reassuring vital signs and is in no distress. She says she does not have her primary care doctor and she had a gastroenterologist who is now retired. [facility]'ll [facility]. She understands the plan and is willing to comply. ____________________________________________   FINAL CLINICAL IMPRESSION(S) / ED DIAGNOSES  Acute on chronic upper abdominal pain. Nausea.    NEW MEDICATIONS STARTED DURING THIS VISIT:  New Prescriptions   No medications on file     Note:  This document was prepared using Dragon voice recognition software and may include unintentional dictation errors.    Myrna Blazer, MD 10/30/15 [phone]

## 2015-10-30 NOTE — ED Notes (Signed)
Sashay Felling RN helped the Pt ambulate to the bedside commode.

## 2015-10-30 NOTE — ED Notes (Signed)
Patient present to ED with RUQ abdominal pain and nausea since 5 pm yesterday. Pain has been increasing throughtout the night. Denies vomiting, diarrhea. Patient leaning over in pain.

## 2015-10-30 NOTE — Discharge Instructions (Signed)

## 2015-11-06 DIAGNOSIS — F419 Anxiety disorder, unspecified: Secondary | ICD-10-CM | POA: Diagnosis not present

## 2015-11-09 ENCOUNTER — Emergency Department
Admission: EM | Admit: 2015-11-09 | Discharge: 2015-11-09 | Disposition: A | Discharge status: Home / Self Care | Payer: Commercial Managed Care - HMO | Attending: Emergency Medicine | Admitting: Emergency Medicine

## 2015-11-09 ENCOUNTER — Encounter: Payer: Self-pay | Admitting: Emergency Medicine

## 2015-11-09 DIAGNOSIS — M1990 Unspecified osteoarthritis, unspecified site: Secondary | ICD-10-CM | POA: Diagnosis not present

## 2015-11-09 DIAGNOSIS — M810 Age-related osteoporosis without current pathological fracture: Secondary | ICD-10-CM | POA: Insufficient documentation

## 2015-11-09 DIAGNOSIS — I10 Essential (primary) hypertension: Secondary | ICD-10-CM | POA: Insufficient documentation

## 2015-11-09 DIAGNOSIS — R45851 Suicidal ideations: Secondary | ICD-10-CM | POA: Diagnosis not present

## 2015-11-09 DIAGNOSIS — F419 Anxiety disorder, unspecified: Secondary | ICD-10-CM

## 2015-11-09 DIAGNOSIS — J45909 Unspecified asthma, uncomplicated: Secondary | ICD-10-CM | POA: Diagnosis not present

## 2015-11-09 DIAGNOSIS — Z79899 Other long term (current) drug therapy: Secondary | ICD-10-CM | POA: Diagnosis not present

## 2015-11-09 DIAGNOSIS — F17210 Nicotine dependence, cigarettes, uncomplicated: Secondary | ICD-10-CM | POA: Diagnosis not present

## 2015-11-09 DIAGNOSIS — G510 Bell's palsy: Secondary | ICD-10-CM | POA: Diagnosis not present

## 2015-11-09 DIAGNOSIS — Z853 Personal history of malignant neoplasm of breast: Secondary | ICD-10-CM | POA: Insufficient documentation

## 2015-11-09 DIAGNOSIS — F418 Other specified anxiety disorders: Secondary | ICD-10-CM | POA: Diagnosis not present

## 2015-11-09 LAB — CBC
HCT: 40.4 % (ref 35.0–47.0)
Hemoglobin: 13.7 g/dL (ref 12.0–16.0)
MCH: 32.5 pg (ref 26.0–34.0)
MCHC: 33.9 g/dL (ref 32.0–36.0)
MCV: 95.9 fL (ref 80.0–100.0)
PLATELETS: 180 10*3/uL (ref 150–440)
RBC: 4.21 MIL/uL (ref 3.80–5.20)
RDW: 15.5 % — AB (ref 11.5–14.5)
WBC: 7.3 10*3/uL (ref 3.6–11.0)

## 2015-11-09 LAB — ETHANOL: Alcohol, Ethyl (B): <5 mg/dL (ref <5)

## 2015-11-09 LAB — COMPREHENSIVE METABOLIC PANEL
ALBUMIN: 4.3 g/dL (ref 3.5–5.0)
ALT: 11 U/L — AB (ref 14–54)
AST: 16 U/L (ref 15–41)
Alkaline Phosphatase: 44 U/L (ref 38–126)
Anion gap: 6 (ref 5–15)
BUN: 18 mg/dL (ref 6–20)
CHLORIDE: 111 mmol/L (ref 101–111)
CO2: 23 mmol/L (ref 22–32)
CREATININE: 0.96 mg/dL (ref 0.44–1.00)
Calcium: 9.1 mg/dL (ref 8.9–10.3)
GFR calc non Af Amer: 59 mL/min — ABNORMAL LOW (ref >60)
GLUCOSE: 100 mg/dL — AB (ref 65–99)
Potassium: 4.4 mmol/L (ref 3.5–5.1)
SODIUM: 140 mmol/L (ref 135–145)
Total Bilirubin: 0.6 mg/dL (ref 0.3–1.2)
Total Protein: 7.5 g/dL (ref 6.5–8.1)

## 2015-11-09 LAB — URINE DRUG SCREEN, QUALITATIVE (ARMC ONLY)
Amphetamines, Ur Screen: NOT DETECTED
BARBITURATES, UR SCREEN: POSITIVE — AB
BENZODIAZEPINE, UR SCRN: NOT DETECTED
Cannabinoid 50 Ng, Ur ~~LOC~~: NOT DETECTED
Cocaine Metabolite,Ur ~~LOC~~: NOT DETECTED
MDMA (Ecstasy)Ur Screen: NOT DETECTED
METHADONE SCREEN, URINE: NOT DETECTED
OPIATE, UR SCREEN: NOT DETECTED
Phencyclidine (PCP) Ur S: NOT DETECTED
TRICYCLIC, UR SCREEN: NOT DETECTED

## 2015-11-09 LAB — ACETAMINOPHEN LEVEL

## 2015-11-09 LAB — SALICYLATE LEVEL

## 2015-11-09 MED ORDER — LORAZEPAM 1 MG PO TABS
ORAL_TABLET | ORAL | Status: AC
  Administered 2015-11-09: 1 mg via ORAL
  Filled 2015-11-09: qty 1

## 2015-11-09 MED ORDER — LORAZEPAM 1 MG PO TABS
1.0000 mg | ORAL_TABLET | Freq: Once | ORAL | Status: AC
  Administered 2015-11-09: 1 mg via ORAL
  Filled 2015-11-09: qty 1

## 2015-11-09 MED ORDER — LORAZEPAM 1 MG PO TABS
1.0000 mg | ORAL_TABLET | Freq: Once | ORAL | Status: AC
  Administered 2015-11-09: 1 mg via ORAL
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
## 2015-11-09 NOTE — ED Notes (Signed)
Patient discharge and follow up information reviewed with patient by ED nursing staff and patient given the opportunity to ask questions pertaining to ED visit and discharge plan of care. Patient advised that should symptoms not continue to improve, resolve entirely, or should new symptoms develop then a follow up visit with their PCP or a return visit to the ED may be warranted. Patient verbalized consent and understanding of discharge plan of care including potential need for further evaluation. Patient being discharged in stable condition per attending ED physician on duty. Pt going home with her husband.

## 2015-11-09 NOTE — ED Notes (Signed)
Spoke with Max Fickleina Reese from [facility] who reports they have an open case on the patient and she feels the patient should not be released unless with her daughter. Ms. Pecola LeisureReese will call the daughter and speak with her about this. Ms. Pecola LeisureReese will return call after speaking to the daughter.
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
## 2015-11-09 NOTE — ED Notes (Signed)
BEHAVIORAL HEALTH ROUNDING  Patient sleeping: No.  Patient alert and oriented: yes  Behavior appropriate: Yes. ; If no, describe:  Nutrition and fluids offered: Yes  Toileting and hygiene offered: Yes  Sitter present: not applicable, Q 15 min safety rounds and observation.  Law enforcement present: Yes ODS  
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
## 2015-11-09 NOTE — ED Notes (Signed)
Adult protective services notified, awaiting call [facility]

## 2015-12-02 ENCOUNTER — Ambulatory Visit
Admission: EM | Admit: 2015-12-02 | Discharge: 2015-12-02 | Disposition: A | Discharge status: Home / Self Care | Payer: Commercial Managed Care - HMO | Attending: Family Medicine | Admitting: Family Medicine

## 2015-12-02 DIAGNOSIS — F419 Anxiety disorder, unspecified: Secondary | ICD-10-CM | POA: Diagnosis not present

## 2015-12-02 NOTE — ED Provider Notes (Signed)
CSN: [id number]     Arrival date & time 12/02/15  4098 History   First MD Initiated Contact with Patient 12/02/15 1045    Nurses notes were reviewed. Chief Complaint  Patient presents with  . Anxiety   [name] 70 year old white female who states that she needs something for anxiety and nerves. She states that her husband had left and her daughter states that he has left her many times before but the patient reiterated that this time he's packed his back and took his stuff. She states she needs something for nerves. She denies wanting to hurt herself but once again needs something for her nerves.  I've explained to her that she has a note from her psychiatrist's end of May that locked which I cannot access was not absolutely have to but as long as she is under psychiatric care and this is a chronic problem I cannot give her any medication by prescription to take as an outpatient. She states that she needs something now and I've explained to her as a total before we no longer have access at the urgent care for any type of nerve or narcotic medication.  Her daughter suggested that she try some Benadryl. And her daughter basically states that she told her the same things that I told her earlier. Once again it should be noted the patient denies wanting to hurt herself. There was a note in the ED where they talked about doing involuntary commitment apparently she was able to change her story well enough that they did not follow through with the involuntary commitment.  No pertinent family medical history to today's visit. She has history of asthma heart murmur depression and irritable bowel. As well as anxiety. She's had cholecystectomy colonoscopy and throat biopsy. He still smokes.  (Consider location/radiation/quality/duration/timing/severity/associated sxs/prior Treatment) Patient is a 70 y.o. female presenting with anxiety. The history is provided by the patient and a relative. No language  interpreter was used.  Anxiety This is a chronic problem. The problem occurs constantly. The problem has been gradually worsening. Pertinent negatives include no chest pain, no abdominal pain, no headaches and no shortness of breath. Nothing aggravates the symptoms. Nothing relieves the symptoms. She has tried nothing for the symptoms. The treatment provided no relief.    Past Medical History  Diagnosis Date  . Asthma   . Ulcer   . Heart murmur   . Cystitis   . Arthritis   . Depression   . Spastic colon   . IBS (irritable bowel syndrome)   . Osteoporosis   . Anxiety    Past Surgical History  Procedure Laterality Date  . Cholecystectomy    . Spine surgery    . Colonoscopy  2002  . Upper gi endoscopy  2011  . Arm surgery Left 2011    fracture repair  . Throat biopsy   2012  . Shoulder surgery    . Elbow surgery    . Breast biopsy      LCIS   Family History  Problem Relation Age of Onset  . Cancer Sister     ovarian  . Cancer Maternal Aunt     breast  . Cancer Maternal Aunt     breast  . Heart attack Mother    Social History  Substance Use Topics  . Smoking status: Current Every Day Smoker -- 1.00 packs/day for 20 years    Types: Cigarettes    Start date: 03/31/1977  . Smokeless tobacco: Never Used  .  Alcohol Use: No     Comment: Ocassional   OB History    Gravida Para Term Preterm AB TAB SAB Ectopic Multiple Living   1         2      Obstetric Comments   Menstrual age: [age]  Age 1st Pregnancy: [age]     Review of Systems  Respiratory: Negative for shortness of breath.   Cardiovascular: Negative for chest pain.  Gastrointestinal: Negative for abdominal pain.  Neurological: Negative for headaches.  All other systems reviewed and are negative.   Allergies  Dristan cold and Prednisone  Home Medications   Prior to Admission medications   Medication Sig Start Date End Date Taking? Authorizing Provider  alendronate (FOSAMAX) 70 MG tablet Take 1 tablet by  mouth once a week. Reported on 09/25/2015 08/29/15   Historical Provider, MD  divalproex (DEPAKOTE) 250 MG DR tablet Take 500 tablets by mouth 2 (two) times daily.  09/14/15   Historical Provider, MD  donepezil (ARICEPT) 5 MG tablet Take 5 mg by mouth at bedtime.     Historical Provider, MD  escitalopram (LEXAPRO) 10 MG tablet Take 10 mg by mouth daily.    Historical Provider, MD  gabapentin (NEURONTIN) 100 MG capsule 1 bid  For 3 days then 2   Bid Patient taking differently: Take 100-200 mg by mouth 2 (two) times daily. 1 bid  For 3 days then 2   Bid 09/24/15   Archer AsaGerald Plovsky, MD  metoCLOPramide (REGLAN) 10 MG tablet Take 1 tablet (10 mg total) by mouth every 6 (six) hours as needed. 10/30/15   Myrna Blazeravid Matthew Schaevitz, MD  mirtazapine (REMERON) 30 MG tablet Take 1 tablet (30 mg total) by mouth at bedtime. 09/24/15   Archer AsaGerald Plovsky, MD  omeprazole (PRILOSEC) 40 MG capsule Take 1 capsule (40 mg total) by mouth daily. 10/30/15 10/29/16  Myrna Blazeravid Matthew Schaevitz, MD  ondansetron (ZOFRAN ODT) 4 MG disintegrating tablet Take 1 tablet (4 mg total) by mouth every 8 (eight) hours as needed for nausea or vomiting. 10/06/15   Rockne MenghiniAnne-Caroline Norman, MD  QUEtiapine (SEROQUEL) 50 MG tablet Take 1-2 tablets (50-100 mg total) by mouth 2 (two) times daily. Take 1 tablet (50mg ) in the morning and 2 tablets (100mg ) at bedtime 09/24/15   Archer AsaGerald Plovsky, MD  ranitidine (ZANTAC) 150 MG tablet Take 1 tablet (150 mg total) by mouth at bedtime. 09/11/15 09/10/16  Sharyn CreamerMark Quale, MD  traZODone (DESYREL) 100 MG tablet Take 100 mg by mouth at bedtime.    Historical Provider, MD   Meds Ordered and Administered this Visit  Medications - No data to display  BP 109/79 mmHg  Pulse 92  Temp(Src) 98.4 F (36.9 C) (Tympanic)  Resp 17  Ht 5' 2.5" (1.588 m)  Wt 113 lb (51.256 kg)  BMI 20.33 kg/m2  SpO2 100% No data found.   Physical Exam  Constitutional: She is oriented to person, place, and time. She appears well-developed and  well-nourished.  HENT:  Head: Normocephalic and atraumatic.  Eyes: Conjunctivae are normal. Pupils are equal, round, and reactive to light.  Neck: Normal range of motion.  Musculoskeletal: Normal range of motion.  Neurological: She is alert and oriented to person, place, and time.  Skin: Skin is warm. No erythema.  Psychiatric: Her mood appears anxious. Her speech is rapid and/or pressured. She is agitated. She expresses inappropriate judgment.  Vitals reviewed.   ED Course  Procedures (including critical care time)  Labs Review Labs Reviewed - No data  to display  Imaging Review No results found.   Visual Acuity Review  Right Eye Distance:   Left Eye Distance:   Bilateral Distance:    Right Eye Near:   Left Eye Near:    Bilateral Near:         MDM   1. Anxiety       As per daughter's comments unable to give her medication here and because she is under psychiatric care on a regular basis will not be able to give her benzodiazepines diazepam either. Recommend she really feels she needs a benzosdiazepam at this time that she go to the ED of her choice.  Skin patient denies wanting to hurt herself.  Hassan Rowan, MD 12/02/15 [phone]

## 2015-12-02 NOTE — Discharge Instructions (Signed)
Panic Attacks °Panic attacks are sudden, short feelings of great fear or discomfort. You may have them for no reason when you are relaxed, when you are uneasy (anxious), or when you are sleeping.  °HOME CARE °· Take all your medicines as told. °· Check with your doctor before starting new medicines. °· Keep all doctor visits. °GET HELP IF: °· You are not able to take your medicines as told. °· Your symptoms do not get better. °· Your symptoms get worse. °GET HELP RIGHT AWAY IF: °· Your attacks seem different than your normal attacks. °· You have thoughts about hurting yourself or others. °· You take panic attack medicine and you have a side effect. °MAKE SURE YOU: °· Understand these instructions. °· Will watch your condition. °· Will get help right away if you are not doing well or get worse. °  °This information is not intended to replace advice given to you by your health care provider. Make sure you discuss any questions you have with your health care provider. °  °Document Released: 06/05/2010 Document Revised: 02/21/2013 Document Reviewed: 12/15/2012 °Elsevier Interactive Patient Education ©2016 [employer]. ° °

## 2015-12-02 NOTE — ED Notes (Signed)
Patient complains of anxiety/panic attacks tonight. Patient states that her husband left her again and she is very anxious. Patient also complains of a headache.

## 2015-12-05 NOTE — ED Provider Notes (Addendum)
CSN: [id number]     Arrival date & time 10/15/15  1616 History   First MD Initiated Contact with Patient 10/15/15 1651     Chief Complaint  Patient presents with  . Extremity Laceration   (Consider location/radiation/quality/duration/timing/severity/associated sxs/prior Treatment) HPI Comments: 70 yo female with a complaint of left lower forearm abrasion after she was scratched her dog earlier today. Denies any dog bite. States she cleaned the area with soap and water. Does not recall her last tetanus vaccine.   The history is provided by the patient.    Past Medical History  Diagnosis Date  . Asthma   . Ulcer   . Heart murmur   . Cystitis   . Arthritis   . Depression   . Spastic colon   . IBS (irritable bowel syndrome)   . Osteoporosis   . Anxiety    Past Surgical History  Procedure Laterality Date  . Cholecystectomy    . Spine surgery    . Colonoscopy  2002  . Upper gi endoscopy  2011  . Arm surgery Left 2011    fracture repair  . Throat biopsy   2012  . Shoulder surgery    . Elbow surgery    . Breast biopsy      LCIS   Family History  Problem Relation Age of Onset  . Cancer Sister     ovarian  . Cancer Maternal Aunt     breast  . Cancer Maternal Aunt     breast  . Heart attack Mother    Social History  Substance Use Topics  . Smoking status: Current Every Day Smoker -- 1.00 packs/day for 20 years    Types: Cigarettes    Start date: 03/31/1977  . Smokeless tobacco: Never Used  . Alcohol Use: No     Comment: Ocassional   OB History    Gravida Para Term Preterm AB TAB SAB Ectopic Multiple Living   1         2      Obstetric Comments   Menstrual age: [age]  Age 1st Pregnancy: [age]     Review of Systems  Allergies  Dristan cold and Prednisone  Home Medications   Prior to Admission medications   Medication Sig Start Date End Date Taking? Authorizing Provider  divalproex (DEPAKOTE) 250 MG DR tablet Take 500 tablets by mouth 2 (two) times daily.   09/14/15  Yes Historical Provider, MD  donepezil (ARICEPT) 5 MG tablet Take 5 mg by mouth at bedtime.    Yes Historical Provider, MD  escitalopram (LEXAPRO) 10 MG tablet Take 10 mg by mouth daily.   Yes Historical Provider, MD  mirtazapine (REMERON) 30 MG tablet Take 1 tablet (30 mg total) by mouth at bedtime. 09/24/15  Yes Archer AsaGerald Plovsky, MD  ondansetron (ZOFRAN ODT) 4 MG disintegrating tablet Take 1 tablet (4 mg total) by mouth every 8 (eight) hours as needed for nausea or vomiting. 10/06/15  Yes Anne-Caroline Sharma CovertNorman, MD  QUEtiapine (SEROQUEL) 50 MG tablet Take 1-2 tablets (50-100 mg total) by mouth 2 (two) times daily. Take 1 tablet (50mg ) in the morning and 2 tablets (100mg ) at bedtime 09/24/15  Yes Archer AsaGerald Plovsky, MD  traZODone (DESYREL) 100 MG tablet Take 100 mg by mouth at bedtime.   Yes Historical Provider, MD  alendronate (FOSAMAX) 70 MG tablet Take 1 tablet by mouth once a week. Reported on 09/25/2015 08/29/15   Historical Provider, MD  gabapentin (NEURONTIN) 100 MG capsule 1 bid  For  3 days then 2   Bid Patient taking differently: Take 100-200 mg by mouth 2 (two) times daily. 1 bid  For 3 days then 2   Bid 09/24/15   Archer Asa, MD  metoCLOPramide (REGLAN) 10 MG tablet Take 1 tablet (10 mg total) by mouth every 6 (six) hours as needed. 10/30/15   Myrna Blazer, MD  omeprazole (PRILOSEC) 40 MG capsule Take 1 capsule (40 mg total) by mouth daily. 10/30/15 10/29/16  Myrna Blazer, MD  ranitidine (ZANTAC) 150 MG tablet Take 1 tablet (150 mg total) by mouth at bedtime. 09/11/15 09/10/16  Sharyn Creamer, MD   Meds Ordered and Administered this Visit   Medications  Tdap (BOOSTRIX) injection 0.5 mL (0.5 mLs Intramuscular Given 10/15/15 1706)    BP 111/57 mmHg  Pulse 88  Temp(Src) 98.5 F (36.9 C) (Oral)  Resp 18  Ht  (1.6 m)  Wt 113 lb (51.256 kg)  BMI 20.02 kg/m2  SpO2 100% No data found.   Physical Exam  Constitutional: She appears well-developed and well-nourished.  No distress.  Skin: She is not diaphoretic.  Nursing note and vitals reviewed.   ED Course  Procedures (including critical care time)  Labs Review Labs Reviewed - No data to display  Imaging Review No results found.   Visual Acuity Review  Right Eye Distance:   Left Eye Distance:   Bilateral Distance:    Right Eye Near:   Left Eye Near:    Bilateral Near:         MDM   1. Abrasion of forearm, left, initial encounter    Discharge Medication List as of 10/15/2015  5:01 PM     1. diagnosis reviewed with patient 2.  Recommend supportive treatment with routine wound care 3. Tdap vaccine given to patient 4.Follow-up prn if symptoms worsen or don't improve    Payton Mccallum, MD 12/05/15 1610  Payton Mccallum, MD 12/05/15 [phone]

## 2015-12-28 ENCOUNTER — Other Ambulatory Visit (HOSPITAL_COMMUNITY): Payer: Self-pay | Admitting: Psychiatry

## 2016-01-17 ENCOUNTER — Emergency Department
Admission: EM | Admit: 2016-01-17 | Discharge: 2016-01-17 | Disposition: A | Discharge status: Home / Self Care | Payer: Commercial Managed Care - HMO | Attending: Emergency Medicine | Admitting: Emergency Medicine

## 2016-01-17 ENCOUNTER — Encounter: Payer: Self-pay | Admitting: Emergency Medicine

## 2016-01-17 DIAGNOSIS — J45909 Unspecified asthma, uncomplicated: Secondary | ICD-10-CM | POA: Diagnosis not present

## 2016-01-17 DIAGNOSIS — Z5321 Procedure and treatment not carried out due to patient leaving prior to being seen by health care provider: Secondary | ICD-10-CM | POA: Diagnosis not present

## 2016-01-17 DIAGNOSIS — R109 Unspecified abdominal pain: Secondary | ICD-10-CM | POA: Diagnosis present

## 2016-01-17 DIAGNOSIS — R110 Nausea: Secondary | ICD-10-CM | POA: Diagnosis not present

## 2016-01-17 DIAGNOSIS — F17210 Nicotine dependence, cigarettes, uncomplicated: Secondary | ICD-10-CM | POA: Diagnosis not present

## 2016-01-17 DIAGNOSIS — Z79899 Other long term (current) drug therapy: Secondary | ICD-10-CM | POA: Insufficient documentation

## 2016-01-17 LAB — COMPREHENSIVE METABOLIC PANEL
ALBUMIN: 4.1 g/dL (ref 3.5–5.0)
ALT: 10 U/L — ABNORMAL LOW (ref 14–54)
AST: 15 U/L (ref 15–41)
Alkaline Phosphatase: 43 U/L (ref 38–126)
Anion gap: 7 (ref 5–15)
BUN: 16 mg/dL (ref 6–20)
CHLORIDE: 108 mmol/L (ref 101–111)
CO2: 24 mmol/L (ref 22–32)
Calcium: 9.1 mg/dL (ref 8.9–10.3)
Creatinine, Ser: 0.95 mg/dL (ref 0.44–1.00)
GFR calc Af Amer: >60 mL/min (ref >60)
GFR, EST NON AFRICAN AMERICAN: 60 mL/min — AB (ref >60)
Glucose, Bld: 95 mg/dL (ref 65–99)
POTASSIUM: 3.8 mmol/L (ref 3.5–5.1)
Sodium: 139 mmol/L (ref 135–145)
Total Bilirubin: 0.4 mg/dL (ref 0.3–1.2)
Total Protein: 7.5 g/dL (ref 6.5–8.1)

## 2016-01-17 LAB — CBC
HEMATOCRIT: 41.1 % (ref 35.0–47.0)
HEMOGLOBIN: 14.2 g/dL (ref 12.0–16.0)
MCH: 32.6 pg (ref 26.0–34.0)
MCHC: 34.5 g/dL (ref 32.0–36.0)
MCV: 94.4 fL (ref 80.0–100.0)
Platelets: 166 10*3/uL (ref 150–440)
RBC: 4.36 MIL/uL (ref 3.80–5.20)
RDW: 15.8 % — AB (ref 11.5–14.5)
WBC: 10.2 10*3/uL (ref 3.6–11.0)

## 2016-01-17 LAB — URINALYSIS COMPLETE WITH MICROSCOPIC (ARMC ONLY)
BACTERIA UA: NONE SEEN
Bilirubin Urine: NEGATIVE
Glucose, UA: NEGATIVE mg/dL
Hgb urine dipstick: NEGATIVE
Ketones, ur: NEGATIVE mg/dL
Nitrite: NEGATIVE
PH: 6 (ref 5.0–8.0)
PROTEIN: 30 mg/dL — AB
Specific Gravity, Urine: 1.016 (ref 1.005–1.030)

## 2016-01-17 NOTE — ED Notes (Signed)
Pt ambulatory to desk to state she is leaving at this time.

## 2016-01-17 NOTE — ED Notes (Signed)
Pt blood tubes were sent to the lab. Pt was given a urine cup and asked to provide sample when able

## 2016-01-17 NOTE — ED Notes (Signed)
Pt updated on treatment progress and delay. Pt states " I don't like to wait, but I know you're doing what you can."

## 2016-01-17 NOTE — ED Triage Notes (Signed)
Pt states rlq pain that began an hour ago with nausea, no vomiting. Pt states "the pain just gets worse." pt drinking milk in triage. Explanation of NPO provided to pt who verbalizes understanding. Skin pwd.

## 2016-01-22 ENCOUNTER — Emergency Department
Admission: EM | Admit: 2016-01-22 | Discharge: 2016-01-23 | Disposition: A | Discharge status: Other Acute Inpatient Hospital | Payer: Commercial Managed Care - HMO | Attending: Emergency Medicine | Admitting: Emergency Medicine

## 2016-01-22 DIAGNOSIS — K219 Gastro-esophageal reflux disease without esophagitis: Secondary | ICD-10-CM | POA: Diagnosis present

## 2016-01-22 DIAGNOSIS — R4689 Other symptoms and signs involving appearance and behavior: Secondary | ICD-10-CM

## 2016-01-22 DIAGNOSIS — F17210 Nicotine dependence, cigarettes, uncomplicated: Secondary | ICD-10-CM | POA: Diagnosis not present

## 2016-01-22 DIAGNOSIS — J45909 Unspecified asthma, uncomplicated: Secondary | ICD-10-CM | POA: Diagnosis not present

## 2016-01-22 DIAGNOSIS — F0280 Dementia in other diseases classified elsewhere without behavioral disturbance: Secondary | ICD-10-CM

## 2016-01-22 DIAGNOSIS — F03918 Unspecified dementia, unspecified severity, with other behavioral disturbance: Secondary | ICD-10-CM | POA: Diagnosis present

## 2016-01-22 DIAGNOSIS — Z853 Personal history of malignant neoplasm of breast: Secondary | ICD-10-CM | POA: Insufficient documentation

## 2016-01-22 DIAGNOSIS — Z8709 Personal history of other diseases of the respiratory system: Secondary | ICD-10-CM

## 2016-01-22 DIAGNOSIS — Z79899 Other long term (current) drug therapy: Secondary | ICD-10-CM | POA: Insufficient documentation

## 2016-01-22 DIAGNOSIS — F319 Bipolar disorder, unspecified: Secondary | ICD-10-CM | POA: Diagnosis not present

## 2016-01-22 DIAGNOSIS — I10 Essential (primary) hypertension: Secondary | ICD-10-CM | POA: Insufficient documentation

## 2016-01-22 DIAGNOSIS — F918 Other conduct disorders: Secondary | ICD-10-CM | POA: Diagnosis not present

## 2016-01-22 DIAGNOSIS — Z8679 Personal history of other diseases of the circulatory system: Secondary | ICD-10-CM

## 2016-01-22 DIAGNOSIS — F0391 Unspecified dementia with behavioral disturbance: Secondary | ICD-10-CM | POA: Diagnosis present

## 2016-01-22 HISTORY — DX: Bipolar disorder, unspecified: F31.9

## 2016-01-22 LAB — CBC
HCT: 41.4 % (ref 35.0–47.0)
Hemoglobin: 14 g/dL (ref 12.0–16.0)
MCH: 32.1 pg (ref 26.0–34.0)
MCHC: 33.9 g/dL (ref 32.0–36.0)
MCV: 94.7 fL (ref 80.0–100.0)
PLATELETS: 154 10*3/uL (ref 150–440)
RBC: 4.37 MIL/uL (ref 3.80–5.20)
RDW: 15.7 % — AB (ref 11.5–14.5)
WBC: 9.3 10*3/uL (ref 3.6–11.0)

## 2016-01-22 LAB — URINALYSIS COMPLETE WITH MICROSCOPIC (ARMC ONLY)
BACTERIA UA: NONE SEEN
Bilirubin Urine: NEGATIVE
Glucose, UA: NEGATIVE mg/dL
Hgb urine dipstick: NEGATIVE
Ketones, ur: NEGATIVE mg/dL
LEUKOCYTES UA: NEGATIVE
Nitrite: NEGATIVE
PROTEIN: 100 mg/dL — AB
SPECIFIC GRAVITY, URINE: 1.019 (ref 1.005–1.030)
pH: 6 (ref 5.0–8.0)

## 2016-01-22 LAB — COMPREHENSIVE METABOLIC PANEL
ALBUMIN: 4.6 g/dL (ref 3.5–5.0)
ALT: 11 U/L — ABNORMAL LOW (ref 14–54)
ANION GAP: 9 (ref 5–15)
AST: 16 U/L (ref 15–41)
Alkaline Phosphatase: 52 U/L (ref 38–126)
BUN: 17 mg/dL (ref 6–20)
CHLORIDE: 108 mmol/L (ref 101–111)
CO2: 24 mmol/L (ref 22–32)
Calcium: 9.5 mg/dL (ref 8.9–10.3)
Creatinine, Ser: 1.15 mg/dL — ABNORMAL HIGH (ref 0.44–1.00)
GFR calc Af Amer: 55 mL/min — ABNORMAL LOW (ref >60)
GFR calc non Af Amer: 47 mL/min — ABNORMAL LOW (ref >60)
GLUCOSE: 132 mg/dL — AB (ref 65–99)
POTASSIUM: 3.5 mmol/L (ref 3.5–5.1)
SODIUM: 141 mmol/L (ref 135–145)
Total Bilirubin: 0.4 mg/dL (ref 0.3–1.2)
Total Protein: 8 g/dL (ref 6.5–8.1)

## 2016-01-22 LAB — ACETAMINOPHEN LEVEL: Acetaminophen (Tylenol), Serum: <10 ug/mL — ABNORMAL LOW (ref 10–30)

## 2016-01-22 LAB — URINE DRUG SCREEN, QUALITATIVE (ARMC ONLY)
Amphetamines, Ur Screen: NOT DETECTED
BARBITURATES, UR SCREEN: NOT DETECTED
Benzodiazepine, Ur Scrn: NOT DETECTED
CANNABINOID 50 NG, UR ~~LOC~~: NOT DETECTED
Cocaine Metabolite,Ur ~~LOC~~: NOT DETECTED
MDMA (ECSTASY) UR SCREEN: NOT DETECTED
Methadone Scn, Ur: NOT DETECTED
Opiate, Ur Screen: NOT DETECTED
Phencyclidine (PCP) Ur S: NOT DETECTED
TRICYCLIC, UR SCREEN: NOT DETECTED

## 2016-01-22 LAB — ETHANOL: Alcohol, Ethyl (B): <5 mg/dL (ref <5)

## 2016-01-22 LAB — SALICYLATE LEVEL: Salicylate Lvl: <4 mg/dL (ref 2.8–30.0)

## 2016-01-22 MED ORDER — GABAPENTIN 100 MG PO CAPS
200.0000 mg | ORAL_CAPSULE | Freq: Two times a day (BID) | ORAL | Status: DC
  Administered 2016-01-22 (×2): 200 mg via ORAL
  Filled 2016-01-22 (×4): qty 2

## 2016-01-22 MED ORDER — MIRTAZAPINE 15 MG PO TABS
30.0000 mg | ORAL_TABLET | Freq: Every day | ORAL | Status: DC
  Administered 2016-01-22: 30 mg via ORAL
  Filled 2016-01-22: qty 2

## 2016-01-22 MED ORDER — DIVALPROEX SODIUM 500 MG PO DR TAB
500.0000 mg | DELAYED_RELEASE_TABLET | Freq: Two times a day (BID) | ORAL | Status: DC
  Administered 2016-01-23: 500 mg via ORAL
  Filled 2016-01-22: qty 1

## 2016-01-22 MED ORDER — QUETIAPINE FUMARATE 200 MG PO TABS
ORAL_TABLET | ORAL | Status: AC
  Filled 2016-01-22: qty 1

## 2016-01-22 MED ORDER — BUSPIRONE HCL 10 MG PO TABS
10.0000 mg | ORAL_TABLET | Freq: Two times a day (BID) | ORAL | Status: DC
  Administered 2016-01-22: 10 mg via ORAL
  Filled 2016-01-22: qty 1

## 2016-01-22 MED ORDER — TRAZODONE HCL 100 MG PO TABS
100.0000 mg | ORAL_TABLET | Freq: Every day | ORAL | Status: DC
  Administered 2016-01-22: 100 mg via ORAL
  Filled 2016-01-22: qty 1

## 2016-01-22 MED ORDER — ESCITALOPRAM OXALATE 10 MG PO TABS
20.0000 mg | ORAL_TABLET | Freq: Every day | ORAL | Status: DC
Start: 2016-01-23 — End: 2016-01-23

## 2016-01-22 MED ORDER — ESCITALOPRAM OXALATE 10 MG PO TABS
10.0000 mg | ORAL_TABLET | Freq: Every day | ORAL | Status: DC
  Administered 2016-01-22: 10 mg via ORAL
  Filled 2016-01-22: qty 1

## 2016-01-22 MED ORDER — QUETIAPINE FUMARATE 200 MG PO TABS
100.0000 mg | ORAL_TABLET | Freq: Every day | ORAL | Status: DC
  Administered 2016-01-22: 100 mg via ORAL

## 2016-01-22 MED ORDER — QUETIAPINE FUMARATE 25 MG PO TABS
50.0000 mg | ORAL_TABLET | ORAL | Status: DC
  Administered 2016-01-23: 50 mg via ORAL
  Filled 2016-01-22: qty 2

## 2016-01-22 MED ORDER — QUETIAPINE FUMARATE 25 MG PO TABS
50.0000 mg | ORAL_TABLET | Freq: Two times a day (BID) | ORAL | Status: DC
  Administered 2016-01-22: 50 mg via ORAL
  Filled 2016-01-22: qty 2

## 2016-01-22 MED ORDER — DIVALPROEX SODIUM 250 MG PO DR TAB
250.0000 mg | DELAYED_RELEASE_TABLET | Freq: Three times a day (TID) | ORAL | Status: DC
  Administered 2016-01-22: 250 mg via ORAL
  Filled 2016-01-22: qty 1

## 2016-01-22 MED ORDER — DONEPEZIL HCL 5 MG PO TABS
5.0000 mg | ORAL_TABLET | Freq: Every day | ORAL | Status: DC
  Administered 2016-01-22: 5 mg via ORAL

## 2016-01-22 MED ORDER — DONEPEZIL HCL 5 MG PO TABS
ORAL_TABLET | ORAL | Status: AC
  Administered 2016-01-22: 5 mg via ORAL
  Filled 2016-01-22: qty 1

## 2016-01-22 MED ORDER — DONEPEZIL HCL 5 MG PO TABS: 10.0000 mg | ORAL_TABLET | Freq: Every day | ORAL | Status: DC

## 2016-01-22 MED ORDER — PANTOPRAZOLE SODIUM 40 MG PO TBEC
40.0000 mg | DELAYED_RELEASE_TABLET | Freq: Every day | ORAL | Status: DC
  Administered 2016-01-22: 40 mg via ORAL
  Filled 2016-01-22: qty 1

## 2016-01-22 NOTE — ED Notes (Signed)
Pt eating lunch at present, pt was given to the phone to call her daughter Chales AbrahamsMary Ann, pt did give her the password.
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
## 2016-01-22 NOTE — ED Provider Notes (Signed)
Patient has been accepted for inpatient admission. Transport will be arranged in the morning. We'll continue to monitor.   Willy EddyPatrick Kerie Badger, MD 01/22/16 [phone]

## 2016-01-22 NOTE — ED Notes (Signed)
Patient stated that her granddaughter "overdosed really bad last night," and requested information about her granddaughter's status.  Explained to patient that we are unable to provide information about another patient. Patient verbalized understanding, then asked if she could use the phone. Per Butch, RN, patient was told that she would be able to use the phone at 0900. Patient verbalized understanding. Patient then repeatedly asked for information about her granddaughter, "even just if she's alive or not." Patient was reminded that it is illegal to provide information about another patient's status. Tech acknowledged that it would be understandable for pt to feel frustrated, and assured her that if we could legally give her an update, we would. RN Gwynneth MunsonButch was informed.

## 2016-01-22 NOTE — ED Notes (Signed)
Pt reports 2nd loose stool today, pt had to have clean pants and underwear

## 2016-01-22 NOTE — BH Assessment (Addendum)
Referral information for Geriatric Placement have been faxed to;    [facility] [phone]),    [facility] [phone] or [phone]),    [facility] [phone]),    [facility] ([phone])   [facility] ([phone]), Declined due to dementia diagnosis.    [facility] [phone] or [phone]    [facility] [phone]),    [facility] [phone]).
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
## 2016-01-22 NOTE — ED Notes (Signed)
Pt has tried multiple times to use the bathroom but advised that she is not able to do anything. Pt was given apple juice to drink.

## 2016-01-22 NOTE — BH Assessment (Signed)
Patient has been accepted to [facility].  [facility] Accepting physician is Dr. Nada LibmanSaka Salami.  Call report to [phone].  Representative was [employer].  ER Staff is aware of it Elder Negus(Emile, ER Sect.; Dr. Roxan Hockeyobinson, ER MD & Viviann SpareSteven, Patient's Nurse)    Patient's husband [name](Dan Hillmann-[phone]) have been updated as well.   Patient can arrived anytime tomorrow (01/23/2016) after 9:00am.

## 2016-01-22 NOTE — ED Notes (Signed)
Patient states that she is feeling very anxious because she is worried about her granddaughter and is confused about why she is here. She states that she "was at home minding her own business." Patient requested something to help her relax. RN Butch informed.
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
## 2016-01-23 ENCOUNTER — Encounter: Payer: Self-pay | Admitting: Emergency Medicine

## 2016-01-23 DIAGNOSIS — F17210 Nicotine dependence, cigarettes, uncomplicated: Secondary | ICD-10-CM | POA: Diagnosis not present

## 2016-01-23 DIAGNOSIS — J45909 Unspecified asthma, uncomplicated: Secondary | ICD-10-CM | POA: Diagnosis not present

## 2016-01-23 DIAGNOSIS — F333 Major depressive disorder, recurrent, severe with psychotic symptoms: Secondary | ICD-10-CM | POA: Diagnosis not present

## 2016-01-23 DIAGNOSIS — Z79899 Other long term (current) drug therapy: Secondary | ICD-10-CM | POA: Diagnosis not present

## 2016-01-23 DIAGNOSIS — K589 Irritable bowel syndrome without diarrhea: Secondary | ICD-10-CM | POA: Diagnosis not present

## 2016-01-23 DIAGNOSIS — Z720 Tobacco use: Secondary | ICD-10-CM | POA: Diagnosis not present

## 2016-01-23 DIAGNOSIS — F17200 Nicotine dependence, unspecified, uncomplicated: Secondary | ICD-10-CM | POA: Diagnosis not present

## 2016-01-23 DIAGNOSIS — F3164 Bipolar disorder, current episode mixed, severe, with psychotic features: Secondary | ICD-10-CM | POA: Diagnosis not present

## 2016-01-23 DIAGNOSIS — F319 Bipolar disorder, unspecified: Secondary | ICD-10-CM | POA: Diagnosis not present

## 2016-01-23 DIAGNOSIS — J4520 Mild intermittent asthma, uncomplicated: Secondary | ICD-10-CM | POA: Diagnosis not present

## 2016-01-23 DIAGNOSIS — K588 Other irritable bowel syndrome: Secondary | ICD-10-CM | POA: Diagnosis not present

## 2016-01-23 DIAGNOSIS — M818 Other osteoporosis without current pathological fracture: Secondary | ICD-10-CM | POA: Diagnosis not present

## 2016-01-23 DIAGNOSIS — I10 Essential (primary) hypertension: Secondary | ICD-10-CM | POA: Diagnosis not present

## 2016-01-23 DIAGNOSIS — M1990 Unspecified osteoarthritis, unspecified site: Secondary | ICD-10-CM | POA: Diagnosis not present

## 2016-01-23 DIAGNOSIS — R011 Cardiac murmur, unspecified: Secondary | ICD-10-CM | POA: Diagnosis not present

## 2016-01-23 DIAGNOSIS — Z853 Personal history of malignant neoplasm of breast: Secondary | ICD-10-CM | POA: Diagnosis not present

## 2016-01-23 NOTE — ED Notes (Signed)
[place] report to [place]

## 2016-01-23 NOTE — ED Notes (Signed)
Attempted to call report to [facility].  RN not available at this time, RN instructed to call back.

## 2016-01-23 NOTE — ED Provider Notes (Signed)
-----------------------------------------   7:34 AM on 01/23/2016 -----------------------------------------   Blood pressure 138/90, pulse 78, temperature 97.7 F (36.5 C), temperature source Oral, resp. rate 18, height 5\' 3"  (1.6 m), weight 113 lb (51.3 kg), SpO2 95 %.  The patient had no acute events since last update.  Calm and cooperative at this time.  Disposition is pending Psychiatry/Behavioral Medicine team recommendations.     Irean HongJade J Sheronda Parran, MD 01/23/16 [phone]

## 2016-01-23 NOTE — ED Notes (Signed)
BEHAVIORAL HEALTH ROUNDING Patient sleeping: No. Patient alert and oriented: yes Behavior appropriate: Yes.  ; If no, describe:  Nutrition and fluids offered: Yes  Toileting and hygiene offered: Yes  Sitter present: q 15 min checks Law enforcement present: Yes  

## 2016-01-23 NOTE — ED Notes (Signed)
Pt offered meal tray  

## 2016-03-08 NOTE — Progress Notes (Signed)
Pharmacy notified.

## 2016-04-04 ENCOUNTER — Other Ambulatory Visit (HOSPITAL_COMMUNITY): Payer: Self-pay | Admitting: Psychiatry

## 2016-06-14 DIAGNOSIS — G308 Other Alzheimer's disease: Secondary | ICD-10-CM | POA: Diagnosis not present

## 2016-06-14 DIAGNOSIS — F0281 Dementia in other diseases classified elsewhere with behavioral disturbance: Secondary | ICD-10-CM | POA: Diagnosis not present

## 2016-07-25 ENCOUNTER — Emergency Department
Admission: EM | Admit: 2016-07-25 | Discharge: 2016-07-27 | Disposition: A | Discharge status: Other Acute Inpatient Hospital | Payer: Medicare HMO | Attending: Emergency Medicine | Admitting: Emergency Medicine

## 2016-07-25 ENCOUNTER — Emergency Department: Payer: Medicare HMO

## 2016-07-25 ENCOUNTER — Encounter: Payer: Self-pay | Admitting: Emergency Medicine

## 2016-07-25 DIAGNOSIS — R410 Disorientation, unspecified: Secondary | ICD-10-CM | POA: Insufficient documentation

## 2016-07-25 DIAGNOSIS — Z79899 Other long term (current) drug therapy: Secondary | ICD-10-CM | POA: Diagnosis not present

## 2016-07-25 DIAGNOSIS — R4182 Altered mental status, unspecified: Secondary | ICD-10-CM | POA: Diagnosis not present

## 2016-07-25 DIAGNOSIS — J45909 Unspecified asthma, uncomplicated: Secondary | ICD-10-CM | POA: Diagnosis not present

## 2016-07-25 DIAGNOSIS — F0391 Unspecified dementia with behavioral disturbance: Secondary | ICD-10-CM | POA: Diagnosis present

## 2016-07-25 DIAGNOSIS — Z8679 Personal history of other diseases of the circulatory system: Secondary | ICD-10-CM

## 2016-07-25 DIAGNOSIS — F0280 Dementia in other diseases classified elsewhere without behavioral disturbance: Secondary | ICD-10-CM

## 2016-07-25 DIAGNOSIS — F17210 Nicotine dependence, cigarettes, uncomplicated: Secondary | ICD-10-CM | POA: Diagnosis not present

## 2016-07-25 DIAGNOSIS — I10 Essential (primary) hypertension: Secondary | ICD-10-CM | POA: Diagnosis not present

## 2016-07-25 DIAGNOSIS — F3163 Bipolar disorder, current episode mixed, severe, without psychotic features: Secondary | ICD-10-CM

## 2016-07-25 DIAGNOSIS — F309 Manic episode, unspecified: Secondary | ICD-10-CM | POA: Diagnosis not present

## 2016-07-25 DIAGNOSIS — F03918 Unspecified dementia, unspecified severity, with other behavioral disturbance: Secondary | ICD-10-CM | POA: Diagnosis present

## 2016-07-25 HISTORY — DX: Unspecified dementia, unspecified severity, without behavioral disturbance, psychotic disturbance, mood disturbance, and anxiety: F03.90

## 2016-07-25 LAB — COMPREHENSIVE METABOLIC PANEL
ALBUMIN: 3.3 g/dL — AB (ref 3.5–5.0)
ALK PHOS: 58 U/L (ref 38–126)
ALT: 26 U/L (ref 14–54)
ANION GAP: 5 (ref 5–15)
AST: 27 U/L (ref 15–41)
BUN: 26 mg/dL — ABNORMAL HIGH (ref 6–20)
CHLORIDE: 107 mmol/L (ref 101–111)
CO2: 26 mmol/L (ref 22–32)
Calcium: 8.2 mg/dL — ABNORMAL LOW (ref 8.9–10.3)
Creatinine, Ser: 1.04 mg/dL — ABNORMAL HIGH (ref 0.44–1.00)
GFR calc non Af Amer: 54 mL/min — ABNORMAL LOW (ref >60)
GLUCOSE: 103 mg/dL — AB (ref 65–99)
POTASSIUM: 3.7 mmol/L (ref 3.5–5.1)
SODIUM: 138 mmol/L (ref 135–145)
Total Bilirubin: 0.4 mg/dL (ref 0.3–1.2)
Total Protein: 6 g/dL — ABNORMAL LOW (ref 6.5–8.1)

## 2016-07-25 LAB — URINALYSIS, COMPLETE (UACMP) WITH MICROSCOPIC
Bilirubin Urine: NEGATIVE
GLUCOSE, UA: NEGATIVE mg/dL
HGB URINE DIPSTICK: NEGATIVE
Ketones, ur: NEGATIVE mg/dL
LEUKOCYTES UA: NEGATIVE
NITRITE: NEGATIVE
PROTEIN: 30 mg/dL — AB
Specific Gravity, Urine: 1.016 (ref 1.005–1.030)
Squamous Epithelial / LPF: NONE SEEN
pH: 6 (ref 5.0–8.0)

## 2016-07-25 LAB — CBC WITH DIFFERENTIAL/PLATELET
BASOS PCT: 1 %
Basophils Absolute: 0.1 10*3/uL (ref 0–0.1)
EOS ABS: 0.2 10*3/uL (ref 0–0.7)
EOS PCT: 2 %
HCT: 34.5 % — ABNORMAL LOW (ref 35.0–47.0)
HEMOGLOBIN: 11.8 g/dL — AB (ref 12.0–16.0)
Lymphocytes Relative: 31 %
Lymphs Abs: 2.5 10*3/uL (ref 1.0–3.6)
MCH: 33.2 pg (ref 26.0–34.0)
MCHC: 34.3 g/dL (ref 32.0–36.0)
MCV: 96.9 fL (ref 80.0–100.0)
Monocytes Absolute: 0.5 10*3/uL (ref 0.2–0.9)
Monocytes Relative: 6 %
NEUTROS PCT: 60 %
Neutro Abs: 4.9 10*3/uL (ref 1.4–6.5)
PLATELETS: 167 10*3/uL (ref 150–440)
RBC: 3.56 MIL/uL — ABNORMAL LOW (ref 3.80–5.20)
RDW: 15.4 % — AB (ref 11.5–14.5)
WBC: 8.1 10*3/uL (ref 3.6–11.0)

## 2016-07-25 LAB — URINE DRUG SCREEN, QUALITATIVE (ARMC ONLY)
AMPHETAMINES, UR SCREEN: NOT DETECTED
Barbiturates, Ur Screen: NOT DETECTED
Benzodiazepine, Ur Scrn: POSITIVE — AB
COCAINE METABOLITE, UR ~~LOC~~: NOT DETECTED
Cannabinoid 50 Ng, Ur ~~LOC~~: NOT DETECTED
MDMA (ECSTASY) UR SCREEN: NOT DETECTED
METHADONE SCREEN, URINE: NOT DETECTED
OPIATE, UR SCREEN: NOT DETECTED
Phencyclidine (PCP) Ur S: NOT DETECTED
Tricyclic, Ur Screen: NOT DETECTED

## 2016-07-25 LAB — ETHANOL: Alcohol, Ethyl (B): <5 mg/dL (ref <5)

## 2016-07-25 LAB — TSH: TSH: 1.57 u[IU]/mL (ref 0.350–4.500)

## 2016-07-25 MED ORDER — HALOPERIDOL LACTATE 5 MG/ML IJ SOLN
5.0000 mg | Freq: Once | INTRAMUSCULAR | Status: AC
  Administered 2016-07-25: 5 mg via INTRAMUSCULAR
  Filled 2016-07-25: qty 1

## 2016-07-25 MED ORDER — LORAZEPAM 2 MG/ML IJ SOLN
2.0000 mg | Freq: Once | INTRAMUSCULAR | Status: AC
  Administered 2016-07-25: 2 mg via INTRAMUSCULAR
  Filled 2016-07-25: qty 1
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
## 2016-07-25 NOTE — ED Triage Notes (Addendum)
Patient ambulatory to triage and straight to room with complaints of AMS for 3 days. HX of dementia but no history of this type of agitation. Pt with husband and brother.  And very agitated to the point of psychosis.    Meyer RusselHusband, Joseph [phone]  brother, Eliberto Ivoryustin, [phone]

## 2016-07-25 NOTE — ED Notes (Signed)
Patient transported to CT 

## 2016-07-26 DIAGNOSIS — F3163 Bipolar disorder, current episode mixed, severe, without psychotic features: Secondary | ICD-10-CM | POA: Diagnosis not present

## 2016-07-26 HISTORY — DX: Bipolar disorder, current episode mixed, severe, without psychotic features: F31.63

## 2016-07-26 MED ORDER — MIRTAZAPINE 15 MG PO TABS
30.0000 mg | ORAL_TABLET | Freq: Every day | ORAL | Status: DC
  Administered 2016-07-26: 30 mg via ORAL
  Filled 2016-07-26: qty 2

## 2016-07-26 MED ORDER — DIVALPROEX SODIUM 500 MG PO DR TAB
750.0000 mg | DELAYED_RELEASE_TABLET | Freq: Every day | ORAL | Status: DC
  Administered 2016-07-26: 750 mg via ORAL
  Filled 2016-07-26: qty 1

## 2016-07-26 MED ORDER — QUETIAPINE FUMARATE 25 MG PO TABS
100.0000 mg | ORAL_TABLET | Freq: Every day | ORAL | Status: DC
  Administered 2016-07-26: 100 mg via ORAL
  Filled 2016-07-26: qty 4

## 2016-07-26 MED ORDER — QUETIAPINE FUMARATE 25 MG PO TABS
50.0000 mg | ORAL_TABLET | ORAL | Status: DC
  Administered 2016-07-27: 50 mg via ORAL
  Filled 2016-07-26: qty 2

## 2016-07-26 MED ORDER — ESCITALOPRAM OXALATE 10 MG PO TABS
10.0000 mg | ORAL_TABLET | Freq: Every day | ORAL | Status: DC
  Administered 2016-07-26 – 2016-07-27 (×2): 10 mg via ORAL
  Filled 2016-07-26 (×2): qty 1

## 2016-07-26 MED ORDER — DONEPEZIL HCL 5 MG PO TABS
10.0000 mg | ORAL_TABLET | Freq: Every day | ORAL | Status: DC
  Administered 2016-07-26: 10 mg via ORAL
  Filled 2016-07-26: qty 2

## 2016-07-26 MED ORDER — HALOPERIDOL LACTATE 5 MG/ML IJ SOLN
5.0000 mg | Freq: Once | INTRAMUSCULAR | Status: AC
  Administered 2016-07-26: 5 mg via INTRAVENOUS
  Filled 2016-07-26: qty 1

## 2016-07-26 MED ORDER — PANTOPRAZOLE SODIUM 40 MG PO TBEC
40.0000 mg | DELAYED_RELEASE_TABLET | Freq: Every day | ORAL | Status: DC
  Administered 2016-07-26 – 2016-07-27 (×2): 40 mg via ORAL
  Filled 2016-07-26 (×2): qty 1

## 2016-07-26 MED ORDER — LORAZEPAM 2 MG PO TABS
2.0000 mg | ORAL_TABLET | Freq: Once | ORAL | Status: AC
  Administered 2016-07-26: 2 mg via ORAL

## 2016-07-26 MED ORDER — LORAZEPAM 2 MG PO TABS
ORAL_TABLET | ORAL | Status: AC
  Administered 2016-07-26: 2 mg via ORAL
  Filled 2016-07-26: qty 1

## 2016-07-26 MED ORDER — DIVALPROEX SODIUM 500 MG PO DR TAB
500.0000 mg | DELAYED_RELEASE_TABLET | Freq: Every day | ORAL | Status: DC
  Administered 2016-07-26 – 2016-07-27 (×2): 500 mg via ORAL
  Filled 2016-07-26 (×2): qty 1

## 2016-07-26 MED ORDER — TRAZODONE HCL 100 MG PO TABS
100.0000 mg | ORAL_TABLET | Freq: Every day | ORAL | Status: DC
  Administered 2016-07-26: 100 mg via ORAL
  Filled 2016-07-26: qty 1
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
## 2016-07-26 NOTE — ED Notes (Signed)
Eyes closed, resp even and unlabored; side rails up x 2 for pt safety;

## 2016-07-26 NOTE — ED Provider Notes (Signed)
-----------------------------------------   7:50 AM on 07/26/2016 -----------------------------------------   Blood pressure 129/88, pulse 85, temperature 97.6 F (36.4 C), temperature source Oral, resp. rate 16, height 5\' 5"  (1.651 m), weight 113 lb (51.3 kg), SpO2 100 %.  The patient had no acute events since last update.  Calm and cooperative at this time.  Disposition is pending Psychiatry/Behavioral Medicine team recommendations.     Merrily BrittleNeil Carlee Vonderhaar, MD 07/26/16 [phone]
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
## 2016-07-26 NOTE — ED Notes (Signed)
Spoke to husband about pt condition and updated on labs - pt spouse informed of visitation hours but also advised that if pt was agitated he would probably not be allowed to visit - spouse verbalized understanding

## 2016-07-26 NOTE — ED Notes (Signed)
Pt voided in floor - floor/bed/pt cleaned and dried
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
## 2016-07-26 NOTE — ED Notes (Signed)
Daughter called at this time for pt update, RN informed pt is waiting on psych assessment. Asked to call back this afternoon

## 2016-07-26 NOTE — ED Notes (Signed)
Gave pt food tray. 
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
## 2016-07-26 NOTE — ED Notes (Signed)
PT  IVC  SEEN  BY  DR  CLAPACS  PENDING  PLACEMENT 

## 2016-07-26 NOTE — ED Notes (Signed)
Husband Richardo HanksDan Solmon [phone]

## 2016-07-26 NOTE — ED Notes (Signed)
Changed pt bed linens and put on brief.

## 2016-07-26 NOTE — Progress Notes (Signed)
Referral information for [facility] faxed to;    [facility] [phone]),    [facility] [phone])        [facility] [phone])   [place] [phone]),    [facility] [phone]),    [address] [phone]),    [place] [phone] or [phone]),    [facility] [phone]),

## 2016-07-27 ENCOUNTER — Encounter: Payer: Self-pay | Admitting: Emergency Medicine

## 2016-07-27 DIAGNOSIS — Z79899 Other long term (current) drug therapy: Secondary | ICD-10-CM | POA: Diagnosis not present

## 2016-07-27 DIAGNOSIS — M1990 Unspecified osteoarthritis, unspecified site: Secondary | ICD-10-CM | POA: Diagnosis not present

## 2016-07-27 DIAGNOSIS — F312 Bipolar disorder, current episode manic severe with psychotic features: Secondary | ICD-10-CM | POA: Diagnosis not present

## 2016-07-27 DIAGNOSIS — K219 Gastro-esophageal reflux disease without esophagitis: Secondary | ICD-10-CM | POA: Diagnosis not present

## 2016-07-27 DIAGNOSIS — F0281 Dementia in other diseases classified elsewhere with behavioral disturbance: Secondary | ICD-10-CM | POA: Diagnosis not present

## 2016-07-27 DIAGNOSIS — F0280 Dementia in other diseases classified elsewhere without behavioral disturbance: Secondary | ICD-10-CM | POA: Diagnosis not present

## 2016-07-27 DIAGNOSIS — G301 Alzheimer's disease with late onset: Secondary | ICD-10-CM | POA: Diagnosis not present

## 2016-07-27 DIAGNOSIS — Z720 Tobacco use: Secondary | ICD-10-CM | POA: Diagnosis not present

## 2016-07-27 DIAGNOSIS — D696 Thrombocytopenia, unspecified: Secondary | ICD-10-CM | POA: Diagnosis not present

## 2016-07-27 DIAGNOSIS — K589 Irritable bowel syndrome without diarrhea: Secondary | ICD-10-CM | POA: Diagnosis not present

## 2016-07-27 DIAGNOSIS — D539 Nutritional anemia, unspecified: Secondary | ICD-10-CM | POA: Diagnosis not present

## 2016-07-27 DIAGNOSIS — J449 Chronic obstructive pulmonary disease, unspecified: Secondary | ICD-10-CM | POA: Diagnosis not present

## 2016-07-27 DIAGNOSIS — F17210 Nicotine dependence, cigarettes, uncomplicated: Secondary | ICD-10-CM | POA: Diagnosis not present

## 2016-07-27 DIAGNOSIS — G309 Alzheimer's disease, unspecified: Secondary | ICD-10-CM | POA: Diagnosis not present

## 2016-07-27 DIAGNOSIS — R410 Disorientation, unspecified: Secondary | ICD-10-CM | POA: Diagnosis not present

## 2016-07-27 DIAGNOSIS — Z853 Personal history of malignant neoplasm of breast: Secondary | ICD-10-CM | POA: Diagnosis not present

## 2016-07-27 DIAGNOSIS — F419 Anxiety disorder, unspecified: Secondary | ICD-10-CM | POA: Diagnosis not present

## 2016-07-27 DIAGNOSIS — I10 Essential (primary) hypertension: Secondary | ICD-10-CM | POA: Diagnosis not present

## 2016-07-27 DIAGNOSIS — F17200 Nicotine dependence, unspecified, uncomplicated: Secondary | ICD-10-CM | POA: Diagnosis not present

## 2016-07-27 DIAGNOSIS — R8271 Bacteriuria: Secondary | ICD-10-CM | POA: Diagnosis not present

## 2016-07-27 DIAGNOSIS — J45909 Unspecified asthma, uncomplicated: Secondary | ICD-10-CM | POA: Diagnosis not present

## 2016-07-27 NOTE — ED Notes (Signed)
Pt tearful after visit with husband. Refused to have vitals taken at this time.

## 2016-07-27 NOTE — ED Notes (Signed)
Pt sitting on bed eating meal tray, refused vitals again from this RN. Left dinamap in room with sitter.

## 2016-07-27 NOTE — BH Assessment (Signed)
Writer contacted patient's husband [name](Dan Nied-[phone]) and updated him patient is scheduled to transfer [facility]. Writer informed the patient's nurse Jae Dire(Kate) as well.

## 2016-07-27 NOTE — ED Notes (Signed)
Spoke with daughter who verified password. Informed her will [facility].

## 2016-07-27 NOTE — Progress Notes (Signed)
TTS attempted to contact Ms. Samudio's husband [phone]) to inform of acceptance [facility], No one answered.  TTS also made an attempt to contact Ms. Mallinger's brother Eliberto Ivoryustin at [phone], No one answered on that number either.

## 2016-07-27 NOTE — ED Notes (Signed)
Called [place] and informed them pt is [facility] ED at this time.

## 2016-07-27 NOTE — ED Notes (Signed)
Husband Dan in room to have a visit with pt before she is transported [place] [facility], supervised by Alfredia ClientMary Jo, pt relations. Pt asleep at bedside prior to visit with sitter at bedside.

## 2016-07-27 NOTE — ED Notes (Signed)
Pt belongings are at RN station for discharge with pt.

## 2016-07-27 NOTE — ED Notes (Signed)
Breakfast tray given to sitter to give to pt.

## 2016-07-27 NOTE — ED Notes (Signed)
Pt disoriented to location and time. Requesting to go home to husband, does not want to go to other hospital. NAD. Sitter in room. Plan to [facility].

## 2016-07-27 NOTE — ED Notes (Signed)
Per Deanna ArtisKeisha TTS pt has been accepted [facility] [place] and can arrive after 7am in the morning

## 2016-07-27 NOTE — Progress Notes (Signed)
Patient has been accepted [facility].  Patient assigned to room 413A Accepting physician is Dr. Joseph ArtSubedi.  Call report to [phone] - Ask for Delorise ShinerGrace.  Representative was Earney HamburgLashonda A.  ER Staff is aware of it Bonita Quin( [facility].; Dr. Manson PasseyBrown, ER MD & Rosey Batheresa, RN Patient's Nurse)     Patient can arrive after 7:00 a.m.

## 2016-07-28 DIAGNOSIS — Z853 Personal history of malignant neoplasm of breast: Secondary | ICD-10-CM | POA: Insufficient documentation

## 2016-07-31 DIAGNOSIS — R8271 Bacteriuria: Secondary | ICD-10-CM

## 2016-07-31 HISTORY — DX: Bacteriuria: R82.71

## 2016-08-03 DIAGNOSIS — D696 Thrombocytopenia, unspecified: Secondary | ICD-10-CM

## 2016-08-03 DIAGNOSIS — D539 Nutritional anemia, unspecified: Secondary | ICD-10-CM | POA: Insufficient documentation

## 2016-08-03 HISTORY — DX: Thrombocytopenia, unspecified: D69.6

## 2016-08-20 DIAGNOSIS — R419 Unspecified symptoms and signs involving cognitive functions and awareness: Secondary | ICD-10-CM | POA: Diagnosis not present

## 2016-10-05 DIAGNOSIS — R1310 Dysphagia, unspecified: Secondary | ICD-10-CM | POA: Diagnosis not present

## 2016-10-05 DIAGNOSIS — F0281 Dementia in other diseases classified elsewhere with behavioral disturbance: Secondary | ICD-10-CM | POA: Diagnosis not present

## 2016-10-05 DIAGNOSIS — G301 Alzheimer's disease with late onset: Secondary | ICD-10-CM | POA: Diagnosis not present

## 2016-10-05 DIAGNOSIS — R2689 Other abnormalities of gait and mobility: Secondary | ICD-10-CM | POA: Diagnosis not present

## 2016-10-14 DIAGNOSIS — G9389 Other specified disorders of brain: Secondary | ICD-10-CM | POA: Diagnosis not present

## 2016-10-14 DIAGNOSIS — F0281 Dementia in other diseases classified elsewhere with behavioral disturbance: Secondary | ICD-10-CM | POA: Diagnosis not present

## 2016-10-14 DIAGNOSIS — G301 Alzheimer's disease with late onset: Secondary | ICD-10-CM | POA: Diagnosis not present

## 2016-11-02 DIAGNOSIS — F0281 Dementia in other diseases classified elsewhere with behavioral disturbance: Secondary | ICD-10-CM | POA: Diagnosis not present

## 2016-11-02 DIAGNOSIS — F39 Unspecified mood [affective] disorder: Secondary | ICD-10-CM | POA: Diagnosis not present

## 2016-11-02 DIAGNOSIS — F419 Anxiety disorder, unspecified: Secondary | ICD-10-CM | POA: Diagnosis not present

## 2016-11-02 DIAGNOSIS — R1310 Dysphagia, unspecified: Secondary | ICD-10-CM | POA: Diagnosis not present

## 2016-11-02 DIAGNOSIS — G301 Alzheimer's disease with late onset: Secondary | ICD-10-CM | POA: Diagnosis not present

## 2016-11-02 DIAGNOSIS — R2689 Other abnormalities of gait and mobility: Secondary | ICD-10-CM | POA: Diagnosis not present

## 2016-11-09 DIAGNOSIS — R1310 Dysphagia, unspecified: Secondary | ICD-10-CM

## 2016-11-09 DIAGNOSIS — G301 Alzheimer's disease with late onset: Secondary | ICD-10-CM

## 2016-11-09 DIAGNOSIS — F0281 Dementia in other diseases classified elsewhere with behavioral disturbance: Secondary | ICD-10-CM | POA: Insufficient documentation

## 2016-11-09 DIAGNOSIS — F02818 Dementia in other diseases classified elsewhere, unspecified severity, with other behavioral disturbance: Secondary | ICD-10-CM | POA: Insufficient documentation

## 2016-11-09 DIAGNOSIS — R2689 Other abnormalities of gait and mobility: Secondary | ICD-10-CM | POA: Insufficient documentation

## 2016-11-09 HISTORY — DX: Dysphagia, unspecified: R13.10

## 2016-11-22 DIAGNOSIS — F0281 Dementia in other diseases classified elsewhere with behavioral disturbance: Secondary | ICD-10-CM | POA: Diagnosis not present

## 2016-11-22 DIAGNOSIS — G3109 Other frontotemporal dementia: Secondary | ICD-10-CM | POA: Diagnosis not present

## 2016-12-02 DIAGNOSIS — R1312 Dysphagia, oropharyngeal phase: Secondary | ICD-10-CM | POA: Diagnosis not present

## 2017-02-11 ENCOUNTER — Emergency Department
Admission: EM | Admit: 2017-02-11 | Discharge: 2017-02-11 | Disposition: A | Discharge status: Home / Self Care | Payer: Medicare HMO | Attending: Emergency Medicine | Admitting: Emergency Medicine

## 2017-02-11 ENCOUNTER — Emergency Department: Payer: Medicare HMO

## 2017-02-11 DIAGNOSIS — Z79899 Other long term (current) drug therapy: Secondary | ICD-10-CM | POA: Diagnosis not present

## 2017-02-11 DIAGNOSIS — N3000 Acute cystitis without hematuria: Secondary | ICD-10-CM | POA: Insufficient documentation

## 2017-02-11 DIAGNOSIS — J45909 Unspecified asthma, uncomplicated: Secondary | ICD-10-CM | POA: Diagnosis not present

## 2017-02-11 DIAGNOSIS — R1110 Vomiting, unspecified: Secondary | ICD-10-CM | POA: Insufficient documentation

## 2017-02-11 DIAGNOSIS — A419 Sepsis, unspecified organism: Secondary | ICD-10-CM | POA: Insufficient documentation

## 2017-02-11 DIAGNOSIS — R109 Unspecified abdominal pain: Secondary | ICD-10-CM | POA: Diagnosis not present

## 2017-02-11 DIAGNOSIS — N390 Urinary tract infection, site not specified: Secondary | ICD-10-CM | POA: Diagnosis not present

## 2017-02-11 DIAGNOSIS — R4182 Altered mental status, unspecified: Secondary | ICD-10-CM | POA: Diagnosis not present

## 2017-02-11 DIAGNOSIS — F0390 Unspecified dementia without behavioral disturbance: Secondary | ICD-10-CM | POA: Diagnosis not present

## 2017-02-11 DIAGNOSIS — F17200 Nicotine dependence, unspecified, uncomplicated: Secondary | ICD-10-CM | POA: Diagnosis not present

## 2017-02-11 DIAGNOSIS — N289 Disorder of kidney and ureter, unspecified: Secondary | ICD-10-CM | POA: Diagnosis not present

## 2017-02-11 DIAGNOSIS — K2900 Acute gastritis without bleeding: Secondary | ICD-10-CM

## 2017-02-11 LAB — URINE DRUG SCREEN, QUALITATIVE (ARMC ONLY)
AMPHETAMINES, UR SCREEN: NOT DETECTED
BENZODIAZEPINE, UR SCRN: NOT DETECTED
Barbiturates, Ur Screen: NOT DETECTED
Cannabinoid 50 Ng, Ur ~~LOC~~: NOT DETECTED
Cocaine Metabolite,Ur ~~LOC~~: NOT DETECTED
MDMA (ECSTASY) UR SCREEN: NOT DETECTED
METHADONE SCREEN, URINE: NOT DETECTED
OPIATE, UR SCREEN: NOT DETECTED
Phencyclidine (PCP) Ur S: NOT DETECTED
Tricyclic, Ur Screen: NOT DETECTED

## 2017-02-11 LAB — COMPREHENSIVE METABOLIC PANEL
ALBUMIN: 3.9 g/dL (ref 3.5–5.0)
ALT: 11 U/L — ABNORMAL LOW (ref 14–54)
ANION GAP: 11 (ref 5–15)
AST: 21 U/L (ref 15–41)
Alkaline Phosphatase: 65 U/L (ref 38–126)
BILIRUBIN TOTAL: 0.3 mg/dL (ref 0.3–1.2)
BUN: 22 mg/dL — AB (ref 6–20)
CALCIUM: 9 mg/dL (ref 8.9–10.3)
CO2: 23 mmol/L (ref 22–32)
CREATININE: 1.35 mg/dL — AB (ref 0.44–1.00)
Chloride: 107 mmol/L (ref 101–111)
GFR calc Af Amer: 45 mL/min — ABNORMAL LOW (ref >60)
GFR calc non Af Amer: 39 mL/min — ABNORMAL LOW (ref >60)
GLUCOSE: 91 mg/dL (ref 65–99)
Potassium: 3.5 mmol/L (ref 3.5–5.1)
Sodium: 141 mmol/L (ref 135–145)
TOTAL PROTEIN: 7.2 g/dL (ref 6.5–8.1)

## 2017-02-11 LAB — CBC
HCT: 38.2 % (ref 35.0–47.0)
HEMOGLOBIN: 13 g/dL (ref 12.0–16.0)
MCH: 33.6 pg (ref 26.0–34.0)
MCHC: 34 g/dL (ref 32.0–36.0)
MCV: 98.6 fL (ref 80.0–100.0)
PLATELETS: 167 10*3/uL (ref 150–440)
RBC: 3.87 MIL/uL (ref 3.80–5.20)
RDW: 15.3 % — AB (ref 11.5–14.5)
WBC: 8.9 10*3/uL (ref 3.6–11.0)

## 2017-02-11 LAB — URINALYSIS, COMPLETE (UACMP) WITH MICROSCOPIC
BILIRUBIN URINE: NEGATIVE
GLUCOSE, UA: NEGATIVE mg/dL
HGB URINE DIPSTICK: NEGATIVE
Ketones, ur: 5 mg/dL — AB
NITRITE: NEGATIVE
PH: 8 (ref 5.0–8.0)
Protein, ur: 100 mg/dL — AB
Specific Gravity, Urine: 1.018 (ref 1.005–1.030)

## 2017-02-11 LAB — ETHANOL

## 2017-02-11 LAB — LACTIC ACID, PLASMA: Lactic Acid, Venous: 2.6 mmol/L (ref 0.5–1.9)

## 2017-02-11 LAB — AMMONIA: Ammonia: 24 umol/L (ref 9–35)

## 2017-02-11 MED ORDER — LORAZEPAM 2 MG/ML IJ SOLN
0.5000 mg | INTRAMUSCULAR | Status: DC | PRN
  Administered 2017-02-11: 0.5 mg via INTRAVENOUS

## 2017-02-11 MED ORDER — ONDANSETRON HCL 4 MG/2ML IJ SOLN
4.0000 mg | Freq: Once | INTRAMUSCULAR | Status: AC
  Administered 2017-02-11: 4 mg via INTRAVENOUS
  Filled 2017-02-11: qty 2

## 2017-02-11 MED ORDER — SODIUM CHLORIDE 0.9 % IV BOLUS (SEPSIS)
250.0000 mL | Freq: Once | INTRAVENOUS | Status: AC
  Administered 2017-02-11: 250 mL via INTRAVENOUS

## 2017-02-11 MED ORDER — SODIUM CHLORIDE 0.9 % IV BOLUS (SEPSIS)
500.0000 mL | Freq: Once | INTRAVENOUS | Status: AC
  Administered 2017-02-11: 500 mL via INTRAVENOUS

## 2017-02-11 MED ORDER — LORAZEPAM 2 MG/ML IJ SOLN
INTRAMUSCULAR | Status: AC
  Administered 2017-02-11: 0.5 mg via INTRAVENOUS
  Filled 2017-02-11: qty 1

## 2017-02-11 MED ORDER — SODIUM CHLORIDE 0.9 % IV BOLUS (SEPSIS)
1000.0000 mL | Freq: Once | INTRAVENOUS | Status: AC
  Administered 2017-02-11: 1000 mL via INTRAVENOUS

## 2017-02-11 MED ORDER — CIPROFLOXACIN HCL 250 MG PO TABS: 250.0000 mg | ORAL_TABLET | Freq: Two times a day (BID) | ORAL | 0 refills | Status: AC

## 2017-02-11 MED ORDER — VANCOMYCIN HCL IN DEXTROSE 1-5 GM/200ML-% IV SOLN
1000.0000 mg | Freq: Once | INTRAVENOUS | Status: AC
  Administered 2017-02-11: 1000 mg via INTRAVENOUS
  Filled 2017-02-11: qty 200

## 2017-02-11 MED ORDER — PIPERACILLIN-TAZOBACTAM 3.375 G IVPB 30 MIN
3.3750 g | Freq: Once | INTRAVENOUS | Status: AC
  Administered 2017-02-11: 3.375 g via INTRAVENOUS

## 2017-02-11 MED ORDER — ACETAMINOPHEN 325 MG RE SUPP
975.0000 mg | Freq: Once | RECTAL | Status: AC
  Administered 2017-02-11: 975 mg via RECTAL
  Filled 2017-02-11: qty 1

## 2017-02-11 MED ORDER — IOPAMIDOL (ISOVUE-300) INJECTION 61%
30.0000 mL | Freq: Once | INTRAVENOUS | Status: AC | PRN
  Administered 2017-02-11: 30 mL via ORAL

## 2017-02-11 MED ORDER — IBUPROFEN 800 MG PO TABS
800.0000 mg | ORAL_TABLET | Freq: Once | ORAL | Status: DC
  Filled 2017-02-11: qty 1

## 2017-02-11 MED ORDER — IOPAMIDOL (ISOVUE-300) INJECTION 61%
75.0000 mL | Freq: Once | INTRAVENOUS | Status: AC | PRN
  Administered 2017-02-11: 75 mL via INTRAVENOUS
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
## 2017-02-11 NOTE — ED Notes (Signed)
Pt DC from ED by MD Sainani. Per MD, pt ok to go home at this time with spouse. MD aware of pt's first lactic acid result and does not feel the need for repeat test. MD Sharma Covert made aware of situation. Pt ambulated out of MD by this RN with steady gait.
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
## 2017-02-11 NOTE — ED Notes (Addendum)
Spoke with Consulting civil engineer regarding patient, states to place patient in hallway bed; does not require dressing out at this time.  Pt daughter, Nita Sells called and gave consent for treatment of patient to this RN and Bonita Quin, Charity fundraiser. Reports hx of dementia.

## 2017-02-11 NOTE — ED Provider Notes (Addendum)
[facility] Emergency Department Provider Note  ____________________________________________  Time seen: Approximately 4:37 PM  I have reviewed the triage vital signs and the nursing notes.   HISTORY  Chief Complaint Altered Mental Status  History is limited due to patient altered mental status.  HPI Deborah [name] is a 71 y.o. female w/ a hx of dementia, bipolar d/o, adjustment d/o brought by police after she was found wandering the roads with AMS.  The patient is able to answer basic questions and states she is having pain in her "tummy all over" and has been vomiting.  She denies any constipation or diarrhea, cough or cold sx's.  On arrival to the ED, the patient is tachycardic and has a temperature of 100.0, and states it is 1947.   Past Medical History:  Diagnosis Date  . Anxiety   . Arthritis   . Asthma   . Bipolar 1 disorder (HCC)   . Cystitis   . Dementia   . Depression   . Heart murmur   . IBS (irritable bowel syndrome)   . Osteoporosis   . Spastic colon   . Ulcer     Patient Active Problem List   Diagnosis Date Noted  . Bipolar 1 disorder, mixed, severe (HCC) 07/26/2016  . Adjustment disorder with anxious mood 09/26/2015  . Bipolar 1 disorder, depressed (HCC) 09/25/2015  . Tobacco use disorder 09/25/2015  . Anxiety as acute reaction to exceptional stress 07/23/2015  . Mild dementia 05/29/2015  . History of migraine headaches 09/08/2014  . H/O Bell's palsy 09/08/2014  . H/O: HTN (hypertension) 09/08/2014  . Gastroesophageal reflux disease without esophagitis 09/08/2014  . History of asthma 09/08/2014  . Breast neoplasm, Tis (LCIS) 01/23/2013    Past Surgical History:  Procedure Laterality Date  . arm surgery Left 2011   fracture repair  . BREAST BIOPSY     LCIS  . CHOLECYSTECTOMY    . COLONOSCOPY  2002  . ELBOW SURGERY    . SHOULDER SURGERY    . SPINE SURGERY    . throat biopsy   2012  . UPPER GI ENDOSCOPY  2011     Current Outpatient Rx  . Order #: [id number] Class: Historical Med  . Order #: [id number] Class: Historical Med  . Order #: [id number] Class: Historical Med  . Order #: [id number] Class: Normal  . Order #: [id number] Class: Normal  . Order #: [id number] Class: Historical Med  . Order #: [id number] Class: Print  . Order #: [id number] Class: Normal    Allergies Dristan cold [chlorphen-pe-acetaminophen] and Prednisone  Family History  Problem Relation Age of Onset  . Heart attack Mother   . Cancer Sister        ovarian  . Cancer Maternal Aunt        breast  . Cancer Maternal Aunt        breast    Social History Social History  Substance Use Topics  . Smoking status: Current Every Day Smoker    Packs/day: 1.00    Years: 20.00    Types: Cigarettes    Start date: 03/31/1977  . Smokeless tobacco: Never Used  . Alcohol use No     Comment: Ocassional    Review of Systems Limited due to patient altered mental status or dementia. Constitutional: positive fever. Eyes: No visual changes.nausea discharge. ENT: No sore throat. No congestion or rhinorrhea.no ear pain. Cardiovascular: Denies chest pain. Denies palpitations. Respiratory: Denies shortness of breath.  No cough. Gastrointestinal: positive diffuseabdominal pain.  positivenausea, positivevomiting.  No diarrhea.  No constipation. Genitourinary: Negative for dysuria. Musculoskeletal: Negative for back pain. Skin: Negative for rash. Neurological: Negative for headaches. No focal numbness, tingling or weakness.     ____________________________________________   PHYSICAL EXAM:  VITAL SIGNS: ED Triage Vitals [02/11/17 1611]  Enc Vitals Group     BP 139/70     Pulse Rate (!) 107     Resp 18     Temp 100 F (37.8 C)     Temp Source Oral     SpO2 94 %     Weight      Height      Head Circumference      Peak Flow      Pain Score      Pain Loc      Pain Edu?      Excl. in GC?     Constitutional: the patient is alert  but not oriented. She has a GCS of 15. Poor insight into why she is here. Eyes: Conjunctivae are normal.  EOMI. No scleral icterus.no eye discharge. Head: Atraumatic. Nose: No congestion/rhinnorhea. Mouth/Throat: Mucous membranes are dry.  Neck: No stridor.  Supple.  No JVD. No meningismus. Cardiovascular: fast rate, regular rhythm. No murmurs, rubs or gallops.  Respiratory: Normal respiratory effort.  No accessory muscle use or retractions. Lungs CTAB.  No wheezes, rales or ronchi. Gastrointestinal: Soft, and nondistended.  Diffuse abdominal tenderness to palpation without focality. No guarding or rebound.  No peritoneal signs. Musculoskeletal: No LE edema.  Neurologic:  Alert but not oriented.  Speech is clear.  Face and smile are symmetric.  EOMI.  Moves all extremities well. Skin:  Skin is warm, dry and intact. No rash noted. Psychiatric: Mood and affect are normal.  ____________________________________________   LABS (all labs ordered are listed, but only abnormal results are displayed)  Labs Reviewed  COMPREHENSIVE METABOLIC PANEL - Abnormal; Notable for the following:       Result Value   BUN 22 (*)    Creatinine, Ser 1.35 (*)    ALT 11 (*)    GFR calc non Af Amer 39 (*)    GFR calc Af Amer 45 (*)    All other components within normal limits  CBC - Abnormal; Notable for the following:    RDW 15.3 (*)    All other components within normal limits  URINALYSIS, COMPLETE (UACMP) WITH MICROSCOPIC - Abnormal; Notable for the following:    Color, Urine AMBER (*)    APPearance CLOUDY (*)    Ketones, ur 5 (*)    Protein, ur 100 (*)    Leukocytes, UA SMALL (*)    Bacteria, UA MANY (*)    Squamous Epithelial / LPF 0-5 (*)    All other components within normal limits  LACTIC ACID, PLASMA - Abnormal; Notable for the following:    Lactic Acid, Venous 2.6 (*)    All other components within normal limits  URINE CULTURE  CULTURE, BLOOD (ROUTINE X 2)  CULTURE, BLOOD (ROUTINE X 2)   URINE DRUG SCREEN, QUALITATIVE ([facility] ONLY)  ETHANOL  AMMONIA  LACTIC ACID, PLASMA  CBG MONITORING, ED   ____________________________________________  EKG  ED ECG REPORT I, Rockne Menghini, the attending physician, personally viewed and interpreted this ECG.   Date: 02/11/2017  EKG Time: 1707  Rate: 95  Rhythm: normal sinus rhythm  Axis: normal  Intervals:none  ST&T Change: nonspecific T-wave inversion in V1. No STEMI.  ____________________________________________  RADIOLOGY  No results found.  ____________________________________________  PROCEDURES  Procedure(s) performed: None  Procedures  Critical Care performed: Yes, see critical care note(s) ____________________________________________   INITIAL IMPRESSION / ASSESSMENT AND PLAN / ED COURSE  Pertinent labs & imaging results that were available during my care of the patient were reviewed by me and considered in my medical decision making (see chart for details).  71 y.o. Female with a history of dementia and multiple psychiatric illnesses was found wondering the road with altered mental status. In review of her medical chart, I do not see evidence for recurrent infection or immunocompromised state. On my examination, the patient is tachycardic and almost febrile. I'm concerned that she has an infectious process, which may be causing altered mental status. It is also possible that her altered mental status is from her baseline dementia. Given the significant tenderness she has never abdomen, all cover her with vancomycin and Zosyn for empiric antibiotics and get a CT scan for further evaluation. Intra-abdominal infection, including viral or foodborne GI illness, as well as gallbladder disease, UTI appendicitis, among other intra-abdominal processes are possible. Given that the patient cannot give a full history, we'll also get a chest x-ray.Intravenous fluids have been initiated. The patient will require  admission for further evaluation and treatment.  ----------------------------------------- 6:17 PM on 02/11/2017 -----------------------------------------  At this time, the patient does have a urinary tract infection with sepsis, acute renal insufficiency.  She did initially have some hypotension with a blood pressure in the 90s over 70s, but this has improved with intravenous fluid. She continues to have tachycardiain the low 100s. She has been treated with antibiotics, intravenous fluids. She has a lactic acid of 2.6, which will be rechecked. I'm awaiting the results of her CT imaging, in anticipation for her admission.  ----------------------------------------- 6:52 PM on 02/11/2017 -----------------------------------------  The patient's CT scan of the abdomen shows gastritis. Her CT head does not show any acute intracranial process. The patient will be admitted at this time.  CRITICAL CARE Performed by: Rockne Menghini   Total critical care time: 50 minutes  Critical care time was exclusive of separately billable procedures and treating other patients.  Critical care was necessary to treat or prevent imminent or life-threatening deterioration.  Critical care was time spent personally by me on the following activities: development of treatment plan with patient and/or surrogate as well as nursing, discussions with consultants, evaluation of patient's response to treatment, examination of patient, obtaining history from patient or surrogate, ordering and performing treatments and interventions, ordering and review of laboratory studies, ordering and review of radiographic studies, pulse oximetry and re-evaluation of patient's condition.   ____________________________________________  FINAL CLINICAL IMPRESSION(S) / ED DIAGNOSES  Final diagnoses:  Acute cystitis without hematuria  Sepsis, due to unspecified organism (HCC)  Altered mental status, unspecified altered mental  status type  Acute renal insufficiency         NEW MEDICATIONS STARTED DURING THIS VISIT:  New Prescriptions   No medications on file      Rockne Menghini, MD 02/11/17 1819    Rockne Menghini, MD 02/11/17 [id number]
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
## 2017-02-11 NOTE — ED Notes (Signed)
Date and time results received: 02/11/17 1744 (use smartphrase ".now" to insert current time)  Test: Lactic Acid Critical Value: 2.6  Name of Provider Notified: Dr Sharma Covert  Orders Received? Or Actions Taken?: acknowledged

## 2017-02-12 LAB — URINE CULTURE

## 2017-02-16 LAB — CULTURE, BLOOD (ROUTINE X 2)
CULTURE: NO GROWTH
Culture: NO GROWTH
Special Requests: ADEQUATE

## 2017-06-14 ENCOUNTER — Telehealth: Payer: Self-pay

## 2017-06-14 NOTE — Telephone Encounter (Addendum)
Advised [facility] to continue calling until reached as they must show at the correct time.    ----- Message from Raye SorrowAmanda S Hughley sent at 06/14/2017  9:57 AM EST ----- Regarding: New Patient appointment Adult protective services ask the patient to be seen earlier. So I moved patient appointment to this Friday 06/17/17. I tried calling the facility to make sure I have given them the right date and time but couldn't get in touch with anyone. I want to say I told them this Friday.

## 2017-06-16 ENCOUNTER — Ambulatory Visit: Payer: Self-pay | Admitting: Family Medicine

## 2017-06-17 ENCOUNTER — Encounter: Payer: Self-pay | Admitting: Family Medicine

## 2017-06-17 ENCOUNTER — Ambulatory Visit (INDEPENDENT_AMBULATORY_CARE_PROVIDER_SITE_OTHER): Payer: Medicare HMO | Admitting: Family Medicine

## 2017-06-17 VITALS — BP 122/78 | HR 88 | Resp 16 | Ht 63.0 in | Wt 98.4 lb

## 2017-06-17 DIAGNOSIS — J449 Chronic obstructive pulmonary disease, unspecified: Secondary | ICD-10-CM

## 2017-06-17 DIAGNOSIS — K589 Irritable bowel syndrome without diarrhea: Secondary | ICD-10-CM | POA: Insufficient documentation

## 2017-06-17 DIAGNOSIS — N183 Chronic kidney disease, stage 3 unspecified: Secondary | ICD-10-CM | POA: Insufficient documentation

## 2017-06-17 DIAGNOSIS — Z23 Encounter for immunization: Secondary | ICD-10-CM | POA: Diagnosis not present

## 2017-06-17 DIAGNOSIS — Z8711 Personal history of peptic ulcer disease: Secondary | ICD-10-CM | POA: Insufficient documentation

## 2017-06-17 DIAGNOSIS — F319 Bipolar disorder, unspecified: Secondary | ICD-10-CM | POA: Diagnosis not present

## 2017-06-17 DIAGNOSIS — M1990 Unspecified osteoarthritis, unspecified site: Secondary | ICD-10-CM | POA: Insufficient documentation

## 2017-06-17 DIAGNOSIS — F17200 Nicotine dependence, unspecified, uncomplicated: Secondary | ICD-10-CM

## 2017-06-17 DIAGNOSIS — E538 Deficiency of other specified B group vitamins: Secondary | ICD-10-CM

## 2017-06-17 DIAGNOSIS — F0280 Dementia in other diseases classified elsewhere without behavioral disturbance: Secondary | ICD-10-CM | POA: Diagnosis not present

## 2017-06-17 DIAGNOSIS — R634 Abnormal weight loss: Secondary | ICD-10-CM

## 2017-06-18 LAB — COMPREHENSIVE METABOLIC PANEL
A/G RATIO: 1.6 (ref 1.2–2.2)
ALK PHOS: 87 IU/L (ref 39–117)
ALT: 9 IU/L (ref 0–32)
AST: 14 IU/L (ref 0–40)
Albumin: 4.5 g/dL (ref 3.5–4.8)
BUN/Creatinine Ratio: 18 (ref 12–28)
BUN: 19 mg/dL (ref 8–27)
Bilirubin Total: 0.2 mg/dL (ref 0.0–1.2)
CALCIUM: 9.3 mg/dL (ref 8.7–10.3)
CO2: 20 mmol/L (ref 20–29)
CREATININE: 1.05 mg/dL — AB (ref 0.57–1.00)
Chloride: 103 mmol/L (ref 96–106)
GFR calc Af Amer: 62 mL/min/{1.73_m2} (ref >59)
GFR, EST NON AFRICAN AMERICAN: 54 mL/min/{1.73_m2} — AB (ref >59)
Globulin, Total: 2.9 g/dL (ref 1.5–4.5)
Glucose: 82 mg/dL (ref 65–99)
POTASSIUM: 4.3 mmol/L (ref 3.5–5.2)
SODIUM: 140 mmol/L (ref 134–144)
Total Protein: 7.4 g/dL (ref 6.0–8.5)

## 2017-06-18 LAB — CBC
Hematocrit: 42.7 % (ref 34.0–46.6)
Hemoglobin: 14.6 g/dL (ref 11.1–15.9)
MCH: 32.3 pg (ref 26.6–33.0)
MCHC: 34.2 g/dL (ref 31.5–35.7)
MCV: 95 fL (ref 79–97)
Platelets: 249 10*3/uL (ref 150–379)
RBC: 4.52 x10E6/uL (ref 3.77–5.28)
RDW: 15 % (ref 12.3–15.4)
WBC: 9.7 10*3/uL (ref 3.4–10.8)

## 2017-06-18 LAB — VITAMIN B12: Vitamin B-12: 797 pg/mL (ref 232–1245)

## 2017-06-18 LAB — TSH: TSH: 2.16 u[IU]/mL (ref 0.450–4.500)

## 2017-06-18 LAB — FOLATE: Folate: 6.1 ng/mL

## 2017-06-18 LAB — VITAMIN D 25 HYDROXY (VIT D DEFICIENCY, FRACTURES): VIT D 25 HYDROXY: 15.6 ng/mL — AB (ref 30.0–100.0)

## 2017-06-20 ENCOUNTER — Encounter: Payer: Self-pay | Admitting: Family Medicine

## 2017-06-20 ENCOUNTER — Other Ambulatory Visit: Payer: Self-pay | Admitting: Family Medicine

## 2017-06-20 DIAGNOSIS — E559 Vitamin D deficiency, unspecified: Secondary | ICD-10-CM | POA: Insufficient documentation

## 2017-06-20 MED ORDER — MIRTAZAPINE 7.5 MG PO TABS: 7.5000 mg | ORAL_TABLET | Freq: Every day | ORAL | 2 refills | Status: DC

## 2017-06-20 MED ORDER — VITAMIN D 50 MCG (2000 UT) PO CAPS: 1.0000 | ORAL_CAPSULE | Freq: Every day | ORAL | Status: DC

## 2017-06-20 NOTE — Progress Notes (Signed)
Date:  06/17/2017   Name:  Deborah [name]   DOB:  [birth year]   MRN:  [id number]  PCP:  Schuyler AmorPlonk, Kripa Foskey, MD    Chief Complaint: Establish Care   History of Present Illness:  This is a 72 y.o. female seen for initial visit. [facility]. Off all meds for 3 months, husband thinks doing okay. Hx bipolar d/o previously on Depakote/Remeron/Risperdal/trazodone/Lexapro, admitted two years ago, [facility] ST memory loss past 2 years, on Aricept past year, CT head 01/2017 showed chronic cerebral atrophy with mild to moderate small vessel ischemic disease, wears diapers. Appetite poor, has lost 8# in past year, smokes 1 ppd, hx B12/folate def and CKD3, sleeping ok. No flu imm this year, no known pneumo imms, Tdap 2017.  Review of Systems:  Review of Systems  Constitutional: Negative for chills and fever.  HENT: Negative for ear pain, sore throat and trouble swallowing.   Respiratory: Negative for cough and shortness of breath.   Cardiovascular: Negative for chest pain and leg swelling.  Gastrointestinal: Negative for abdominal pain, constipation and diarrhea.  Genitourinary: Negative for difficulty urinating and pelvic pain.  Musculoskeletal: Negative for joint swelling.  Neurological: Negative for syncope and light-headedness.    Patient Active Problem List   Diagnosis Date Noted  . Arthritis 06/17/2017  . Hx of peptic ulcer 06/17/2017  . IBS (irritable bowel syndrome) 06/17/2017  . Unintended weight loss 06/17/2017  . CKD (chronic kidney disease) stage 3, GFR 30-59 ml/min (HCC) 06/17/2017  . Balance problem 11/09/2016  . Swallowing problem 11/09/2016  . Major depressive disorder, single episode 08/05/2016  . Macrocytic anemia 08/03/2016  . Thrombocytopenia (HCC) 08/03/2016  . Bacteriuria 07/31/2016  . Hx of breast cancer 07/28/2016  . Bipolar 1 disorder, mixed, severe (HCC) 07/26/2016  . Noncompliance  with medications 10/20/2015  . Folate deficiency 10/02/2015  . Adjustment disorder with anxious mood 09/26/2015  . Bipolar 1 disorder, depressed (HCC) 09/25/2015  . Smoker 09/25/2015  . B12 deficiency 09/11/2015  . Anxiety 07/23/2015  . Severe episode of recurrent major depressive disorder (HCC) 07/03/2015  . Dementia in other diseases classified elsewhere without behavioral disturbance 05/29/2015  . Mood disorder (HCC) 12/24/2014  . History of migraine headaches 09/08/2014  . H/O Bell's palsy 09/08/2014  . H/O: HTN (hypertension) 09/08/2014  . Gastroesophageal reflux disease without esophagitis 09/08/2014  . COPD (chronic obstructive pulmonary disease) (HCC) 09/08/2014  . History of asthma 09/08/2014  . Breast neoplasm, Tis (LCIS) 01/23/2013    Prior to Admission medications   Medication Sig Start Date End Date Taking? Authorizing Provider  divalproex (DEPAKOTE) 250 MG DR tablet Take 250 mg by mouth 2 (two) times daily.  09/14/15  Yes [provider]  donepezil (ARICEPT) 10 MG tablet Take 10 mg by mouth at bedtime. 12/28/15  Yes [provider]  gabapentin (NEURONTIN) 100 MG capsule 1 bid  For 3 days then 2   Bid 09/24/15  Yes Plovsky, Earvin HansenGerald, MD  mirtazapine (REMERON) 30 MG tablet Take 1 tablet (30 mg total) by mouth at bedtime. 09/24/15  Yes Plovsky, Earvin HansenGerald, MD  vitamin B-12 (CYANOCOBALAMIN) 1000 MCG tablet Take 1,000 mcg by mouth daily.   Yes [provider]    Allergies  Allergen Reactions  . Prednisone Nausea And Vomiting, Nausea Only and Other (See Comments)  . Chlorphen-Pe-Acetaminophen Other (See Comments)    Reaction: unknown    Past Surgical History:  Procedure Laterality Date  .  arm surgery Left 2011   fracture repair  . BREAST BIOPSY     LCIS  . CHOLECYSTECTOMY    . COLONOSCOPY  2002  . ELBOW SURGERY    . SHOULDER SURGERY    . SPINE SURGERY    . throat biopsy   2012  . UPPER GI ENDOSCOPY  2011    Social History   Tobacco Use  .  Smoking status: Current Every Day Smoker    Packs/day: 1.00    Years: 20.00    Pack years: 20.00    Types: Cigarettes    Start date: 03/31/1977  . Smokeless tobacco: Never Used  Substance Use Topics  . Alcohol use: No    Alcohol/week: 0.0 oz    Comment: Ocassional  . Drug use: No    Family History  Problem Relation Age of Onset  . Heart attack Mother   . Cancer Sister        ovarian  . Cancer Maternal Aunt        breast  . Cancer Maternal Aunt        breast    Medication list has been reviewed and updated.  Physical Examination: BP 122/78   Pulse 88   Resp 16   Ht 5\' 3"  (1.6 m)   Wt 98 lb 6.4 oz (44.6 kg)   SpO2 98%   BMI 17.43 kg/m   Physical Exam  Constitutional: She appears well-developed and well-nourished.  HENT:  Head: Normocephalic and atraumatic.  Right Ear: External ear normal.  Left Ear: External ear normal.  Nose: Nose normal.  Mouth/Throat: Oropharynx is clear and moist.  TMs clear  Eyes: Conjunctivae and EOM are normal. Pupils are equal, round, and reactive to light.  Neck: Neck supple. No thyromegaly present.  Cardiovascular: Normal rate, regular rhythm and normal heart sounds.  Pulmonary/Chest: Effort normal and breath sounds normal.  Abdominal: Soft. She exhibits no distension and no mass. There is no tenderness.  Musculoskeletal: She exhibits no edema.  Lymphadenopathy:    She has no cervical adenopathy.  Neurological: She is alert. Coordination normal.  SLUMS 4/30  Skin: Skin is warm and dry.  Psychiatric:  Fearful, anxious initially but able to be calmed for exam No acute agitation  Nursing note and vitals reviewed.   Assessment and Plan:  1. Bipolar 1 disorder, depressed (HCC) On Depakote/Lexapro/gabapentin/Remeron/Risperdal/trazodone at last psych discharge in March but on no meds x 3 months, is supposed to follow with [facility] psych, husband feels doing okay off meds  2. Dementia in other diseases classified elsewhere without  behavioral disturbance Severe, likely Alz but ischemic changes on CT and psych issues complicate picture, off Aricept x 3 months, husband did not think was helping memory  3. Chronic obstructive pulmonary disease, unspecified COPD type (HCC) Encouraged smoking cessation but unlikely  4. CKD (chronic kidney disease) stage 3, GFR 30-59 ml/min (HCC) Per history - Comprehensive Metabolic Panel (CMET) - CBC - Vitamin D (25 hydroxy)  5. Unintended weight loss Consider restart Remeron - TSH  6. Smoker Cessation unlikely given psych issues  7. B12 deficiency Off supplement - B12  8. Folate deficiency Off supplement - Folate  9. Need for influenza vaccination - Flu vaccine HIGH DOSE PF (Fluzone High dose)  10. Need for pneumococcal vaccination - Pneumococcal conjugate vaccine 13-valent  Return in about 4 weeks (around 07/15/2017).   One hour spent with pt/family over half in counseling.  Dionne Ano. Kingsley Spittle MD [facility]  06/20/2017

## 2017-07-07 ENCOUNTER — Ambulatory Visit: Payer: Self-pay | Admitting: Family Medicine

## 2017-07-14 ENCOUNTER — Other Ambulatory Visit: Payer: Self-pay

## 2017-07-14 MED ORDER — MIRTAZAPINE 7.5 MG PO TABS: 7.5000 mg | ORAL_TABLET | Freq: Every day | ORAL | 1 refills | Status: DC

## 2017-07-15 ENCOUNTER — Ambulatory Visit: Payer: Self-pay | Admitting: Family Medicine

## 2017-07-21 ENCOUNTER — Ambulatory Visit (INDEPENDENT_AMBULATORY_CARE_PROVIDER_SITE_OTHER): Payer: Medicare HMO | Admitting: Family Medicine

## 2017-07-21 ENCOUNTER — Encounter: Payer: Self-pay | Admitting: Family Medicine

## 2017-07-21 VITALS — BP 118/78 | HR 86 | Resp 16 | Ht 63.0 in | Wt 95.0 lb

## 2017-07-21 DIAGNOSIS — F17200 Nicotine dependence, unspecified, uncomplicated: Secondary | ICD-10-CM

## 2017-07-21 DIAGNOSIS — N183 Chronic kidney disease, stage 3 unspecified: Secondary | ICD-10-CM

## 2017-07-21 DIAGNOSIS — E559 Vitamin D deficiency, unspecified: Secondary | ICD-10-CM

## 2017-07-21 DIAGNOSIS — F319 Bipolar disorder, unspecified: Secondary | ICD-10-CM | POA: Diagnosis not present

## 2017-07-21 DIAGNOSIS — R634 Abnormal weight loss: Secondary | ICD-10-CM | POA: Diagnosis not present

## 2017-07-21 MED ORDER — MIRTAZAPINE 15 MG PO TABS: 15.0000 mg | ORAL_TABLET | Freq: Every day | ORAL | 3 refills | Status: DC

## 2017-07-21 NOTE — Progress Notes (Signed)
Date:  07/21/2017   Name:  Deborah [name]   DOB:  06/10/1945   MRN:  [id number]  PCP:  Schuyler Amor, MD    Chief Complaint: Manic Behavior   History of Present Illness:  This is a 72 y.o. female seen for one month f/u from initial visit. Remeron started last visit for poor appetite with unintended weight loss. Husband feels doing about the same. On vit D supplement. Continues to smoke.  Review of Systems:  Review of Systems  Constitutional: Negative for chills and fever.  Respiratory: Negative for cough and shortness of breath.   Cardiovascular: Negative for chest pain and leg swelling.  Genitourinary: Negative for difficulty urinating.  Neurological: Negative for syncope and light-headedness.    Patient Active Problem List   Diagnosis Date Noted  . Vitamin D deficiency 06/20/2017  . Arthritis 06/17/2017  . Hx of peptic ulcer 06/17/2017  . IBS (irritable bowel syndrome) 06/17/2017  . Unintended weight loss 06/17/2017  . CKD (chronic kidney disease) stage 3, GFR 30-59 ml/min (HCC) 06/17/2017  . Balance problem 11/09/2016  . Swallowing problem 11/09/2016  . Late onset Alzheimer's disease with behavioral disturbance 11/09/2016  . Major depressive disorder, single episode 08/05/2016  . Macrocytic anemia 08/03/2016  . Thrombocytopenia (HCC) 08/03/2016  . Bacteriuria 07/31/2016  . Hx of breast cancer 07/28/2016  . Bipolar 1 disorder, mixed, severe (HCC) 07/26/2016  . Noncompliance with medications 10/20/2015  . Folate deficiency 10/02/2015  . Adjustment disorder with anxious mood 09/26/2015  . Bipolar 1 disorder, depressed (HCC) 09/25/2015  . Smoker 09/25/2015  . B12 deficiency 09/11/2015  . Anxiety 07/23/2015  . Severe episode of recurrent major depressive disorder (HCC) 07/03/2015  . Mood disorder (HCC) 12/24/2014  . History of migraine headaches 09/08/2014  . H/O Bell's palsy 09/08/2014  . H/O: HTN (hypertension) 09/08/2014  . Gastroesophageal reflux disease without  esophagitis 09/08/2014  . COPD (chronic obstructive pulmonary disease) (HCC) 09/08/2014  . History of asthma 09/08/2014  . Breast neoplasm, Tis (LCIS) 01/23/2013    Prior to Admission medications   Medication Sig Start Date End Date Taking? Authorizing Provider  Cholecalciferol (VITAMIN D) 2000 units CAPS Take 1 capsule (2,000 Units total) by mouth daily. 06/20/17   Kendahl Bumgardner, Chrissie Noa, MD  mirtazapine (REMERON) 15 MG tablet Take 1 tablet (15 mg total) by mouth at bedtime. 07/21/17   Schuyler Amor, MD    Allergies  Allergen Reactions  . Prednisone Nausea And Vomiting, Nausea Only and Other (See Comments)  . Chlorphen-Pe-Acetaminophen Other (See Comments)    Reaction: unknown    Past Surgical History:  Procedure Laterality Date  . arm surgery Left 2011   fracture repair  . BREAST BIOPSY     LCIS  . CHOLECYSTECTOMY    . COLONOSCOPY  2002  . ELBOW SURGERY    . SHOULDER SURGERY    . SPINE SURGERY    . throat biopsy   2012  . UPPER GI ENDOSCOPY  2011    Social History   Tobacco Use  . Smoking status: Current Every Day Smoker    Packs/day: 1.00    Years: 20.00    Pack years: 20.00    Types: Cigarettes    Start date: 03/31/1977  . Smokeless tobacco: Never Used  Substance Use Topics  . Alcohol use: No    Alcohol/week: 0.0 oz    Comment: Ocassional  . Drug use: No    Family History  Problem Relation Age of Onset  . Heart attack Mother   .  Cancer Sister        ovarian  . Cancer Maternal Aunt        breast  . Cancer Maternal Aunt        breast    Medication list has been reviewed and updated.  Physical Examination: BP 118/78   Pulse 86   Resp 16   Ht 5\' 3"  (1.6 m)   Wt 95 lb (43.1 kg)   SpO2 97%   BMI 16.83 kg/m   Physical Exam  Constitutional: She appears well-developed. No distress.  Cardiovascular: Normal rate, regular rhythm and normal heart sounds.  Pulmonary/Chest: Effort normal and breath sounds normal.  Musculoskeletal: She exhibits no edema.   Neurological: She is alert.  Skin: Skin is warm and dry.  Psychiatric: Her behavior is normal.  Fearful but cooperative  Nursing note and vitals reviewed.   Assessment and Plan:  1. Bipolar 1 disorder, depressed (HCC) Increase Remeron to 15 mg qhs, encouraged psych re-eval  2. CKD (chronic kidney disease) stage 3, GFR 30-59 ml/min (HCC) Stable last visit, avoid NSAIDS  3. Unintended weight loss Weight down 3#, may improve on increased Remeron  4. Smoker Encouraged cessation, unlikey  5. Vitamin D deficiency On supplement, consider level next visit  Return in about 4 weeks (around 08/18/2017).  [name], Jr. MD [facility]  07/21/2017

## 2017-08-01 ENCOUNTER — Telehealth: Payer: Self-pay
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
## 2017-08-01 NOTE — Telephone Encounter (Signed)
Yes but would need input from SW to complete FL2 regarding ADLs, incontinence issues, etc.

## 2017-08-03 NOTE — Telephone Encounter (Signed)
Left detailed message.   

## 2017-08-18 ENCOUNTER — Ambulatory Visit: Payer: Self-pay | Admitting: Family Medicine

## 2017-08-18 ENCOUNTER — Ambulatory Visit (INDEPENDENT_AMBULATORY_CARE_PROVIDER_SITE_OTHER): Payer: Medicare HMO | Admitting: Family Medicine

## 2017-08-18 ENCOUNTER — Encounter: Payer: Self-pay | Admitting: Family Medicine

## 2017-08-18 VITALS — BP 127/82 | HR 90 | Resp 16 | Ht 63.0 in | Wt 96.4 lb

## 2017-08-18 DIAGNOSIS — F17200 Nicotine dependence, unspecified, uncomplicated: Secondary | ICD-10-CM | POA: Diagnosis not present

## 2017-08-18 DIAGNOSIS — E559 Vitamin D deficiency, unspecified: Secondary | ICD-10-CM

## 2017-08-18 DIAGNOSIS — N183 Chronic kidney disease, stage 3 unspecified: Secondary | ICD-10-CM

## 2017-08-18 DIAGNOSIS — R634 Abnormal weight loss: Secondary | ICD-10-CM

## 2017-08-18 DIAGNOSIS — F319 Bipolar disorder, unspecified: Secondary | ICD-10-CM

## 2017-08-18 NOTE — Progress Notes (Signed)
Date:  08/18/2017   Name:  Deborah [name]   DOB:  [birth year]   MRN:  [id number]  PCP:  Schuyler Amor, MD    Chief Complaint: Manic Behavior (Will go with Deborah [name] after today. )   History of Present Illness:  This is a 72 y.o. female seen for one month f/u. On increased Remeron, less moody, appetite improved but still marginal. Continues to smoke and not interested in quitting. Taking vit D supplement.  Review of Systems:  Review of Systems  Constitutional: Negative for chills and fever.  Respiratory: Negative for cough and shortness of breath.   Cardiovascular: Negative for chest pain and leg swelling.  Genitourinary: Negative for difficulty urinating.  Neurological: Negative for syncope and light-headedness.    Patient Active Problem List   Diagnosis Date Noted  . Vitamin D deficiency 06/20/2017  . Arthritis 06/17/2017  . Hx of peptic ulcer 06/17/2017  . IBS (irritable bowel syndrome) 06/17/2017  . Unintended weight loss 06/17/2017  . CKD (chronic kidney disease) stage 3, GFR 30-59 ml/min (HCC) 06/17/2017  . Balance problem 11/09/2016  . Swallowing problem 11/09/2016  . Late onset Alzheimer's disease with behavioral disturbance 11/09/2016  . Major depressive disorder, single episode 08/05/2016  . Macrocytic anemia 08/03/2016  . Thrombocytopenia (HCC) 08/03/2016  . Bacteriuria 07/31/2016  . Hx of breast cancer 07/28/2016  . Bipolar 1 disorder, mixed, severe (HCC) 07/26/2016  . Noncompliance with medications 10/20/2015  . Folate deficiency 10/02/2015  . Adjustment disorder with anxious mood 09/26/2015  . Bipolar 1 disorder, depressed (HCC) 09/25/2015  . Smoker 09/25/2015  . B12 deficiency 09/11/2015  . Anxiety 07/23/2015  . Severe episode of recurrent major depressive disorder (HCC) 07/03/2015  . Mood disorder (HCC) 12/24/2014  . History of migraine headaches 09/08/2014  . H/O Bell's palsy 09/08/2014  . H/O: HTN (hypertension) 09/08/2014  . Gastroesophageal reflux  disease without esophagitis 09/08/2014  . COPD (chronic obstructive pulmonary disease) (HCC) 09/08/2014  . History of asthma 09/08/2014  . Breast neoplasm, Tis (LCIS) 01/23/2013    Prior to Admission medications   Medication Sig Start Date End Date Taking? Authorizing Provider  Cholecalciferol (VITAMIN D) 2000 units CAPS Take 1 capsule (2,000 Units total) by mouth daily. 06/20/17  Yes Davene Jobin, Chrissie Noa, MD  mirtazapine (REMERON) 15 MG tablet Take 1 tablet (15 mg total) by mouth at bedtime. 07/21/17  Yes Guadalupe Kerekes, Chrissie Noa, MD    Allergies  Allergen Reactions  . Prednisone Nausea And Vomiting, Nausea Only and Other (See Comments)  . Chlorphen-Pe-Acetaminophen Other (See Comments)    Reaction: unknown    Past Surgical History:  Procedure Laterality Date  . arm surgery Left 2011   fracture repair  . BREAST BIOPSY     LCIS  . CHOLECYSTECTOMY    . COLONOSCOPY  2002  . ELBOW SURGERY    . SHOULDER SURGERY    . SPINE SURGERY    . throat biopsy   2012  . UPPER GI ENDOSCOPY  2011    Social History   Tobacco Use  . Smoking status: Current Every Day Smoker    Packs/day: 1.00    Years: 20.00    Pack years: 20.00    Types: Cigarettes    Start date: 03/31/1977  . Smokeless tobacco: Never Used  Substance Use Topics  . Alcohol use: No    Alcohol/week: 0.0 oz    Comment: Ocassional  . Drug use: No    Family History  Problem Relation Age of Onset  . Heart attack  Mother   . Cancer Sister        ovarian  . Cancer Maternal Aunt        breast  . Cancer Maternal Aunt        breast    Medication list has been reviewed and updated.  Physical Examination: BP 127/82   Pulse 90   Resp 16   Ht 5\' 3"  (1.6 m)   Wt 96 lb 6.4 oz (43.7 kg)   SpO2 97%   BMI 17.08 kg/m   Physical Exam  Constitutional: She appears well-developed.  Cardiovascular: Normal rate, regular rhythm and normal heart sounds.  Pulmonary/Chest: Effort normal and breath sounds normal.  Musculoskeletal: She exhibits  no edema.  Neurological: She is alert.  Skin: Skin is warm and dry.  Psychiatric: Her behavior is normal.  Less fearful  Nursing note and vitals reviewed.   Assessment and Plan:  1. Bipolar 1 disorder, depressed (HCC) Improved on increased Remeron  2. CKD (chronic kidney disease) stage 3, GFR 30-59 ml/min (HCC) Stable in Feb, avoiding NSAIDS - Basic Metabolic Panel (BMET)  3. Unintended weight loss Weight up 1#, cont Remeron  4. Smoker Advised cessation but unwilling to quit  5. Vitamin D deficiency On supplement - Vitamin D (25 hydroxy)  Return in about 6 months (around 02/17/2018).  Deborah [name], Jr. MD [facility]  08/18/2017

## 2017-08-19 LAB — VITAMIN D 25 HYDROXY (VIT D DEFICIENCY, FRACTURES): VIT D 25 HYDROXY: 31.8 ng/mL (ref 30.0–100.0)

## 2017-08-19 LAB — BASIC METABOLIC PANEL
BUN/Creatinine Ratio: 17 (ref 12–28)
BUN: 18 mg/dL (ref 8–27)
CALCIUM: 9.8 mg/dL (ref 8.7–10.3)
CHLORIDE: 101 mmol/L (ref 96–106)
CO2: 21 mmol/L (ref 20–29)
Creatinine, Ser: 1.06 mg/dL — ABNORMAL HIGH (ref 0.57–1.00)
GFR calc non Af Amer: 53 mL/min/{1.73_m2} — ABNORMAL LOW (ref >59)
GFR, EST AFRICAN AMERICAN: 61 mL/min/{1.73_m2} (ref >59)
GLUCOSE: 76 mg/dL (ref 65–99)
POTASSIUM: 4.1 mmol/L (ref 3.5–5.2)
Sodium: 141 mmol/L (ref 134–144)

## 2017-12-04 ENCOUNTER — Encounter: Payer: Self-pay | Admitting: Emergency Medicine

## 2017-12-04 ENCOUNTER — Ambulatory Visit
Admission: EM | Admit: 2017-12-04 | Discharge: 2017-12-04 | Disposition: A | Discharge status: Home / Self Care | Payer: Medicare HMO | Attending: Family Medicine | Admitting: Family Medicine

## 2017-12-04 DIAGNOSIS — H109 Unspecified conjunctivitis: Secondary | ICD-10-CM

## 2017-12-04 MED ORDER — POLYMYXIN B-TRIMETHOPRIM 10000-0.1 UNIT/ML-% OP SOLN: 1.0000 [drp] | Freq: Four times a day (QID) | OPHTHALMIC | 0 refills | Status: AC

## 2017-12-04 MED ORDER — OLOPATADINE HCL 0.2 % OP SOLN: OPHTHALMIC | 0 refills | Status: DC

## 2017-12-04 NOTE — ED Provider Notes (Signed)
[facility]    CSN: [id number] Arrival date & time: 12/04/17  1004  History   Chief Complaint Chief Complaint  Patient presents with  . Eye Problem   HPI   72 year old female presents for evaluation of bilateral eye redness.  History is obtained from the husband.  Patient has mental illness as well as dementia which prevents her from providing history.  Husband states that her eyes have been red since yesterday.  He reports clear drainage.  When I asked the patient if this is bothering her, she states that it is not.  No reports of matting or crusting.  We could not do a visual exam today.  No known inciting factor.  Husband does state that he has given some eyedrops without resolution.  No other associated symptoms.  No other complaints.  Past Medical History:  Diagnosis Date  . Anxiety   . Arthritis   . Asthma   . Bipolar 1 disorder (HCC)   . Cystitis   . Dementia   . Depression   . Heart murmur   . IBS (irritable bowel syndrome)   . Osteoporosis   . Spastic colon   . Ulcer     Patient Active Problem List   Diagnosis Date Noted  . Vitamin D deficiency 06/20/2017  . Arthritis 06/17/2017  . Hx of peptic ulcer 06/17/2017  . IBS (irritable bowel syndrome) 06/17/2017  . Unintended weight loss 06/17/2017  . CKD (chronic kidney disease) stage 3, GFR 30-59 ml/min (HCC) 06/17/2017  . Balance problem 11/09/2016  . Swallowing problem 11/09/2016  . Late onset Alzheimer's disease with behavioral disturbance 11/09/2016  . Major depressive disorder, single episode 08/05/2016  . Macrocytic anemia 08/03/2016  . Thrombocytopenia (HCC) 08/03/2016  . Bacteriuria 07/31/2016  . Hx of breast cancer 07/28/2016  . Bipolar 1 disorder, mixed, severe (HCC) 07/26/2016  . Noncompliance with medications 10/20/2015  . Folate deficiency 10/02/2015  . Adjustment disorder with anxious mood 09/26/2015  . Bipolar 1 disorder, depressed (HCC) 09/25/2015  . Smoker 09/25/2015  . B12  deficiency 09/11/2015  . Anxiety 07/23/2015  . Severe episode of recurrent major depressive disorder (HCC) 07/03/2015  . Mood disorder (HCC) 12/24/2014  . History of migraine headaches 09/08/2014  . H/O Bell's palsy 09/08/2014  . H/O: HTN (hypertension) 09/08/2014  . Gastroesophageal reflux disease without esophagitis 09/08/2014  . COPD (chronic obstructive pulmonary disease) (HCC) 09/08/2014  . History of asthma 09/08/2014  . Breast neoplasm, Tis (LCIS) 01/23/2013    Past Surgical History:  Procedure Laterality Date  . arm surgery Left 2011   fracture repair  . BREAST BIOPSY     LCIS  . CHOLECYSTECTOMY    . COLONOSCOPY  2002  . ELBOW SURGERY    . SHOULDER SURGERY    . SPINE SURGERY    . throat biopsy   2012  . UPPER GI ENDOSCOPY  2011    OB History    Gravida  1   Para      Term      Preterm      AB      Living  2     SAB      TAB      Ectopic      Multiple      Live Births           Obstetric Comments  Menstrual age: [age]  Age 1st Pregnancy: [age]         Home Medications  Prior to Admission medications   Medication Sig Start Date End Date Taking? Authorizing Provider  Cholecalciferol (VITAMIN D) 2000 units CAPS Take 1 capsule (2,000 Units total) by mouth daily. 06/20/17  Yes Plonk, Chrissie Noa, MD  mirtazapine (REMERON) 15 MG tablet Take 1 tablet (15 mg total) by mouth at bedtime. 07/21/17   Plonk, Chrissie Noa, MD  Olopatadine HCl 0.2 % SOLN 1 drop to each eye daily. 12/04/17   Tommie Sams, DO  trimethoprim-polymyxin b (POLYTRIM) ophthalmic solution Place 1 drop into both eyes every 6 (six) hours for 5 days. 12/04/17 12/09/17  Tommie Sams, DO    Family History Family History  Problem Relation Age of Onset  . Heart attack Mother   . Cancer Sister        ovarian  . Cancer Maternal Aunt        breast  . Cancer Maternal Aunt        breast    Social History Social History   Tobacco Use  . Smoking status: Current Every Day Smoker    Packs/day:  1.00    Years: 20.00    Pack years: 20.00    Types: Cigarettes    Start date: 03/31/1977  . Smokeless tobacco: Never Used  Substance Use Topics  . Alcohol use: No    Alcohol/week: 0.0 oz    Comment: Ocassional  . Drug use: No    Allergies   Prednisone and Chlorphen-pe-acetaminophen   Review of Systems Review of Systems  Constitutional: Negative.   Eyes: Positive for redness.   Physical Exam Triage Vital Signs ED Triage Vitals [12/04/17 1014]  Enc Vitals Group     BP (!) 135/107     Pulse Rate 95     Resp 14     Temp 98.9 F (37.2 C)     Temp Source Oral     SpO2 100 %     Weight 96 lb (43.5 kg)     Height      Head Circumference      Peak Flow      Pain Score 0     Pain Loc      Pain Edu?      Excl. in GC?    Updated Vital Signs BP (!) 135/107 (BP Location: Left Arm)   Pulse 95   Temp 98.9 F (37.2 C) (Oral)   Resp 14   Wt 96 lb (43.5 kg)   SpO2 100%   BMI 17.01 kg/m   Visual Acuity Right Eye Distance:   Left Eye Distance:   Bilateral Distance:    Right Eye Near:   Left Eye Near:    Bilateral Near:     Physical Exam  Constitutional: She appears well-developed. No distress.  HENT:  Head: Normocephalic and atraumatic.  Eyes:  Bilateral conjunctival injection.  No matting noted on the eyelashes.  No drainage.  Pulmonary/Chest: Effort normal. No respiratory distress.  Neurological: She is alert.  Skin: Skin is warm. No rash noted.  Psychiatric:  Tearful. Timid & scared.  Nursing note and vitals reviewed.  UC Treatments / Results  Labs (all labs ordered are listed, but only abnormal results are displayed) Labs Reviewed - No data to display  EKG None  Radiology No results found.  Procedures Procedures (including critical care time)  Medications Ordered in UC Medications - No data to display  Initial Impression / Assessment and Plan / UC Course  I have reviewed the triage vital signs and the nursing notes.  Pertinent labs &  imaging results that were available during my care of the patient were reviewed by me and considered in my medical decision making (see chart for details).    72 year old female presents with conjunctivitis.  Uncertain etiology.  Exam is limited as patient is very fearful and timid.  Limited history as well.  Patient placed on empiric Pataday and Polytrim.  Advised to see [place] [facility] if persists or worsens.  Final Clinical Impressions(s) / UC Diagnoses   Final diagnoses:  Conjunctivitis of both eyes, unspecified conjunctivitis type     Discharge Instructions     Eye drops as prescribed.  If persists, have her see [place] [facility].  Take care  Dr. Adriana Simas     ED Prescriptions    Medication Sig Dispense Auth. Provider   Olopatadine HCl 0.2 % SOLN 1 drop to each eye daily. 2.5 mL Allah Reason G, DO   trimethoprim-polymyxin b (POLYTRIM) ophthalmic solution Place 1 drop into both eyes every 6 (six) hours for 5 days. 10 mL Tommie Sams, DO     Controlled Substance Prescriptions [place] Controlled Substance Registry consulted? Not Applicable   Tommie Sams, DO 12/04/17 1052

## 2017-12-04 NOTE — Discharge Instructions (Signed)
Eye drops as prescribed.  If persists, have her see [place] [facility].  Take care  Dr. Adriana Simasook

## 2017-12-04 NOTE — ED Triage Notes (Signed)
Patient's husband states that her eyes have been red and drainage since yesterday.  Patient states that "there is nothing wrong with me"

## 2017-12-04 NOTE — ED Notes (Signed)
Unable to get a vision test on patient due to inability to follow instructions.

## 2018-01-09 ENCOUNTER — Other Ambulatory Visit: Payer: Self-pay | Admitting: Family Medicine

## 2018-02-17 ENCOUNTER — Ambulatory Visit (INDEPENDENT_AMBULATORY_CARE_PROVIDER_SITE_OTHER): Payer: Medicare HMO | Admitting: Internal Medicine

## 2018-02-17 ENCOUNTER — Encounter: Payer: Self-pay | Admitting: Internal Medicine

## 2018-02-17 VITALS — BP 141/82 | HR 92 | Resp 16 | Ht 63.0 in | Wt 95.8 lb

## 2018-02-17 DIAGNOSIS — F0281 Dementia in other diseases classified elsewhere with behavioral disturbance: Secondary | ICD-10-CM

## 2018-02-17 DIAGNOSIS — G301 Alzheimer's disease with late onset: Secondary | ICD-10-CM

## 2018-02-17 DIAGNOSIS — E441 Mild protein-calorie malnutrition: Secondary | ICD-10-CM | POA: Diagnosis not present

## 2018-02-17 DIAGNOSIS — D696 Thrombocytopenia, unspecified: Secondary | ICD-10-CM

## 2018-02-17 DIAGNOSIS — F02818 Dementia in other diseases classified elsewhere, unspecified severity, with other behavioral disturbance: Secondary | ICD-10-CM

## 2018-02-17 DIAGNOSIS — J449 Chronic obstructive pulmonary disease, unspecified: Secondary | ICD-10-CM | POA: Diagnosis not present

## 2018-02-17 DIAGNOSIS — Z23 Encounter for immunization: Secondary | ICD-10-CM | POA: Diagnosis not present

## 2018-02-17 NOTE — Progress Notes (Signed)
Date:  02/17/2018   Name:  Deborah [name]   DOB:  June 07, 1945   MRN:  [id number]   Chief Complaint: Depression  Depression         This is a chronic problem.  The problem occurs daily.The problem is unchanged.  Associated symptoms include no headaches.  Past treatments include other medications.  Compliance with treatment is good.  Past medical history includes Alzheimer's disease and mental health disorder.   Dementia - per husband she remains fairly stable.  She goes with him to play golf or to the grocery.  She sometimes resists a bath but eats well.  She sleeps well, with good appetite.  She wears depends. She is very anxious today.  She is taking remeron nightly.  She sees no other physicians. She continues to smoke.  Her husband keeps the lighter and watches her closely. Counseling not done due to dementia and agitation.  She needs flu vaccine.  Review of Systems  Constitutional: Negative for chills and fever.  Respiratory: Negative for cough, chest tightness, shortness of breath and wheezing.   Cardiovascular: Negative for chest pain.  Gastrointestinal: Negative for constipation and diarrhea.  Neurological: Negative for dizziness and headaches.  Psychiatric/Behavioral: Positive for agitation, depression, dysphoric mood and sleep disturbance.    Patient Active Problem List   Diagnosis Date Noted  . Vitamin D deficiency 06/20/2017  . Arthritis 06/17/2017  . Hx of peptic ulcer 06/17/2017  . IBS (irritable bowel syndrome) 06/17/2017  . Unintended weight loss 06/17/2017  . CKD (chronic kidney disease) stage 3, GFR 30-59 ml/min (HCC) 06/17/2017  . Balance problem 11/09/2016  . Swallowing problem 11/09/2016  . Late onset Alzheimer's disease with behavioral disturbance (HCC) 11/09/2016  . Macrocytic anemia 08/03/2016  . Thrombocytopenia (HCC) 08/03/2016  . Hx of breast cancer 07/28/2016  . Folate deficiency 10/02/2015  . Bipolar 1 disorder, depressed (HCC) 09/25/2015  . Smoker  09/25/2015  . B12 deficiency 09/11/2015  . Anxiety 07/23/2015  . Severe episode of recurrent major depressive disorder (HCC) 07/03/2015  . History of migraine headaches 09/08/2014  . H/O Bell's palsy 09/08/2014  . H/O: HTN (hypertension) 09/08/2014  . Gastroesophageal reflux disease without esophagitis 09/08/2014  . COPD (chronic obstructive pulmonary disease) (HCC) 09/08/2014  . History of asthma 09/08/2014  . Breast neoplasm, Tis (LCIS) 01/23/2013    Allergies  Allergen Reactions  . Prednisone Nausea And Vomiting, Nausea Only and Other (See Comments)  . Chlorphen-Pe-Acetaminophen Other (See Comments)    Reaction: unknown    Past Surgical History:  Procedure Laterality Date  . arm surgery Left 2011   fracture repair  . BREAST BIOPSY     LCIS  . CHOLECYSTECTOMY    . COLONOSCOPY  2002  . ELBOW SURGERY    . SHOULDER SURGERY    . SPINE SURGERY    . throat biopsy   2012  . UPPER GI ENDOSCOPY  2011    Social History   Tobacco Use  . Smoking status: Current Every Day Smoker    Packs/day: 1.00    Years: 20.00    Pack years: 20.00    Types: Cigarettes    Start date: 03/31/1977  . Smokeless tobacco: Never Used  Substance Use Topics  . Alcohol use: No    Alcohol/week: 0.0 standard drinks    Comment: Ocassional  . Drug use: No     Medication list has been reviewed and updated.  Current Meds  Medication Sig  . Cholecalciferol (VITAMIN D) 2000  units CAPS Take 1 capsule (2,000 Units total) by mouth daily.  . mirtazapine (REMERON) 15 MG tablet Take 1 tablet (15 mg total) by mouth at bedtime.  . [DISCONTINUED] Olopatadine HCl 0.2 % SOLN 1 drop to each eye daily.    PHQ 2/9 Scores 02/17/2018 08/18/2017 07/21/2017 06/17/2017  PHQ - 2 Score 0 - 0 -  PHQ- 9 Score 0 - 7 -  Exception Documentation - Medical reason Medical reason Patient refusal  Not completed - mental issues - -    Physical Exam  Constitutional: She appears well-developed. No distress.  HENT:  Head:  Normocephalic and atraumatic.  Neck: Normal range of motion. Neck supple. Carotid bruit is not present.  Cardiovascular: Normal rate, regular rhythm and normal heart sounds.  Pulmonary/Chest: Effort normal and breath sounds normal. No respiratory distress.  Abdominal: Soft. Bowel sounds are normal. She exhibits no mass. There is no tenderness.  Neurological: She is alert.  Skin: Skin is warm and dry. No rash noted.  Psychiatric: Her speech is normal. Her affect is angry. She is agitated and combative.  Nursing note and vitals reviewed.   BP (!) 141/82   Pulse 92   Resp 16   Ht 5\' 3"  (1.6 m)   Wt 95 lb 12.8 oz (43.5 kg)   SpO2 97%   BMI 16.97 kg/m   Assessment and Plan: 1. Chronic obstructive pulmonary disease, unspecified COPD type (HCC) Pt continues to smoke with close supervision  2. Late onset Alzheimer's disease with behavioral disturbance (HCC) Stable with supportive husband. Continue remeron to help with sleep and appetite  3. Need for influenza vaccination - Flu vaccine HIGH DOSE PF  4. Thrombocytopenia (HCC) Resolved - last plt ct 249  5. Protein-calorie malnutrition, mild (HCC) Remains underweight but stable for past year Albumin 3.3 last year but improved as of 06/17/17  Partially dictated using Animal nutritionist. Any errors are unintentional.  Bari Edward, MD [facility]  02/17/2018

## 2018-08-17 ENCOUNTER — Other Ambulatory Visit: Payer: Self-pay

## 2018-08-21 ENCOUNTER — Ambulatory Visit: Payer: Self-pay | Admitting: Internal Medicine

## 2018-08-21 ENCOUNTER — Encounter: Payer: Self-pay | Admitting: Internal Medicine

## 2018-08-21 ENCOUNTER — Ambulatory Visit (INDEPENDENT_AMBULATORY_CARE_PROVIDER_SITE_OTHER): Payer: Medicare HMO | Admitting: Internal Medicine

## 2018-08-21 ENCOUNTER — Other Ambulatory Visit: Payer: Self-pay

## 2018-08-21 VITALS — BP 126/81 | HR 80 | Ht 63.0 in | Wt 102.0 lb

## 2018-08-21 DIAGNOSIS — G301 Alzheimer's disease with late onset: Secondary | ICD-10-CM

## 2018-08-21 DIAGNOSIS — F02818 Dementia in other diseases classified elsewhere, unspecified severity, with other behavioral disturbance: Secondary | ICD-10-CM

## 2018-08-21 DIAGNOSIS — F0281 Dementia in other diseases classified elsewhere with behavioral disturbance: Secondary | ICD-10-CM

## 2018-08-21 DIAGNOSIS — J449 Chronic obstructive pulmonary disease, unspecified: Secondary | ICD-10-CM | POA: Diagnosis not present

## 2018-08-21 DIAGNOSIS — F319 Bipolar disorder, unspecified: Secondary | ICD-10-CM | POA: Diagnosis not present

## 2018-08-21 MED ORDER — MIRTAZAPINE 15 MG PO TABS: 15.0000 mg | ORAL_TABLET | Freq: Every day | ORAL | 3 refills | Status: DC

## 2018-08-21 MED ORDER — DONEPEZIL HCL 5 MG PO TABS: 5.0000 mg | ORAL_TABLET | Freq: Every day | ORAL | 3 refills | Status: DC

## 2018-08-21 NOTE — Progress Notes (Signed)
Date:  08/21/2018   Name:  Deborah [name]   DOB:  07/04/1945   MRN:  [id number]  Pt is here with her husband Deborah [name]. Chief Complaint: Depression (6 month follow up.) and COPD  Depression         This is a chronic problem.The problem is unchanged.  Associated symptoms include decreased concentration.  Associated symptoms include does not have insomnia, no appetite change and no headaches.  Past treatments include other medications.  Compliance with treatment is good.  Past medical history includes Alzheimer's disease and dementia.   Dementia - has been seen by psychiatry in the past, on several different medications.  Her husband says that she is more agitated at times.  He has stopped taking her to psych and neuro because of insurance coverage issues and several last minute cancelled appointments. Tobacco use/COPD - pt continues to smoke under close supervision, about 1/2 ppd.  She has a slight cough at times but Deborah [name] never hears her wheezing or appearing short of breath.  HM - due to advancing dementia, her husband does not want to do any screenings or immunizations.  Review of Systems  Constitutional: Negative for appetite change, chills and fever.  Respiratory: Negative for cough, choking, shortness of breath and wheezing.   Cardiovascular: Negative for chest pain and leg swelling.  Gastrointestinal: Negative for abdominal pain, constipation and diarrhea.  Musculoskeletal: Negative for arthralgias, back pain and gait problem.  Skin: Negative for color change and rash.  Neurological: Negative for dizziness and headaches.  Psychiatric/Behavioral: Positive for behavioral problems, confusion, decreased concentration, depression and dysphoric mood. Negative for sleep disturbance. The patient does not have insomnia.     Patient Active Problem List   Diagnosis Date Noted  . Protein-calorie malnutrition, mild (HCC) 02/17/2018  . Vitamin D deficiency 06/20/2017  . Arthritis 06/17/2017  . Hx of  peptic ulcer 06/17/2017  . IBS (irritable bowel syndrome) 06/17/2017  . Unintended weight loss 06/17/2017  . CKD (chronic kidney disease) stage 3, GFR 30-59 ml/min (HCC) 06/17/2017  . Balance problem 11/09/2016  . Swallowing problem 11/09/2016  . Late onset Alzheimer's disease with behavioral disturbance (HCC) 11/09/2016  . Macrocytic anemia 08/03/2016  . Hx of breast cancer 07/28/2016  . Folate deficiency 10/02/2015  . Bipolar 1 disorder, depressed (HCC) 09/25/2015  . Smoker 09/25/2015  . B12 deficiency 09/11/2015  . Anxiety 07/23/2015  . Severe episode of recurrent major depressive disorder (HCC) 07/03/2015  . History of migraine headaches 09/08/2014  . H/O Bell's palsy 09/08/2014  . H/O: HTN (hypertension) 09/08/2014  . Gastroesophageal reflux disease without esophagitis 09/08/2014  . COPD (chronic obstructive pulmonary disease) (HCC) 09/08/2014  . History of asthma 09/08/2014  . Breast neoplasm, Tis (LCIS) 01/23/2013    Allergies  Allergen Reactions  . Prednisone Nausea And Vomiting, Nausea Only and Other (See Comments)  . Chlorphen-Pe-Acetaminophen Other (See Comments)    Reaction: unknown    Past Surgical History:  Procedure Laterality Date  . arm surgery Left 2011   fracture repair  . BREAST BIOPSY     LCIS  . CHOLECYSTECTOMY    . COLONOSCOPY  2002  . ELBOW SURGERY    . SHOULDER SURGERY    . SPINE SURGERY    . throat biopsy   2012  . UPPER GI ENDOSCOPY  2011    Social History   Tobacco Use  . Smoking status: Current Every Day Smoker    Packs/day: 1.00    Years: 20.00  Pack years: 20.00    Types: Cigarettes    Start date: 03/31/1977  . Smokeless tobacco: Never Used  Substance Use Topics  . Alcohol use: No    Alcohol/week: 0.0 standard drinks    Comment: Ocassional  . Drug use: No     Medication list has been reviewed and updated.  Current Meds  Medication Sig  . Cholecalciferol (VITAMIN D) 2000 units CAPS Take 1 capsule (2,000 Units total)  by mouth daily.  . mirtazapine (REMERON) 15 MG tablet Take 1 tablet (15 mg total) by mouth at bedtime.    PHQ 2/9 Scores 08/21/2018 02/17/2018 08/18/2017 07/21/2017  PHQ - 2 Score - 0 - 0  PHQ- 9 Score - 0 - 7  Exception Documentation Medical reason - Medical reason Medical reason  Not completed - - mental issues -    BP Readings from Last 3 Encounters:  08/21/18 126/81  02/17/18 (!) 141/82  12/04/17 (!) 135/107    Physical Exam Constitutional:      Appearance: She is underweight.  HENT:     Head: Normocephalic.  Cardiovascular:     Rate and Rhythm: Normal rate and regular rhythm.     Pulses: Normal pulses.     Heart sounds: No murmur.  Pulmonary:     Effort: Pulmonary effort is normal.     Breath sounds: Decreased breath sounds present. No wheezing or rhonchi.  Musculoskeletal:     Right lower leg: No edema.     Left lower leg: No edema.  Neurological:     Mental Status: She is alert. She is disoriented.     Motor: Motor function is intact.     Gait: Gait normal.  Psychiatric:        Attention and Perception: Perception normal.        Mood and Affect: Affect is angry.        Behavior: Behavior is combative.        Cognition and Memory: Cognition is impaired.     Wt Readings from Last 3 Encounters:  08/21/18 102 lb (46.3 kg)  02/17/18 95 lb 12.8 oz (43.5 kg)  12/04/17 96 lb (43.5 kg)    BP 126/81   Pulse 80   Ht 5\' 3"  (1.6 m)   Wt 102 lb (46.3 kg)   SpO2 97%   BMI 18.07 kg/m   Assessment and Plan: 1. Late onset Alzheimer's disease with behavioral disturbance (HCC) Will try to restart Aricept - can increase the dose if needed - donepezil (ARICEPT) 5 MG tablet; Take 1 tablet (5 mg total) by mouth at bedtime.  Dispense: 90 tablet; Refill: 3  2. Bipolar 1 disorder, depressed (HCC) Appetite is stable, weight increased slightly - mirtazapine (REMERON) 15 MG tablet; Take 1 tablet (15 mg total) by mouth at bedtime.  Dispense: 90 tablet; Refill: 3  3. Chronic  obstructive pulmonary disease, unspecified COPD type (HCC) Mild changes on CXR Pt continues to smoke ~ 1/2 ppd   Partially dictated using Animal nutritionist. Any errors are unintentional.  Bari Edward, MD [facility]  08/21/2018

## 2018-08-21 NOTE — Patient Instructions (Signed)
This information is directly available on the CDC website: https://www.cdc.gov/coronavirus/2019-ncov/if-you-are-sick/steps-when-sick.html    Source:CDC Reference to specific commercial products, manufacturers, companies, or trademarks does not constitute its endorsement or recommendation by the U.S. Government, Department of Health and Human Services, or Centers for Disease Control and Prevention.  

## 2018-10-01 ENCOUNTER — Other Ambulatory Visit: Payer: Self-pay

## 2018-10-01 ENCOUNTER — Inpatient Hospital Stay: Payer: Medicare HMO

## 2018-10-01 ENCOUNTER — Inpatient Hospital Stay
Admission: EM | Admit: 2018-10-01 | Discharge: 2018-10-10 | DRG: 690 | Disposition: A | Discharge status: Home / Self Care | Payer: Medicare HMO | Attending: Internal Medicine | Admitting: Internal Medicine

## 2018-10-01 ENCOUNTER — Emergency Department: Payer: Medicare HMO

## 2018-10-01 ENCOUNTER — Encounter: Payer: Self-pay | Admitting: Emergency Medicine

## 2018-10-01 DIAGNOSIS — B964 Proteus (mirabilis) (morganii) as the cause of diseases classified elsewhere: Secondary | ICD-10-CM | POA: Diagnosis present

## 2018-10-01 DIAGNOSIS — R4182 Altered mental status, unspecified: Secondary | ICD-10-CM

## 2018-10-01 DIAGNOSIS — F0281 Dementia in other diseases classified elsewhere with behavioral disturbance: Secondary | ICD-10-CM | POA: Diagnosis not present

## 2018-10-01 DIAGNOSIS — R410 Disorientation, unspecified: Secondary | ICD-10-CM

## 2018-10-01 DIAGNOSIS — Z8249 Family history of ischemic heart disease and other diseases of the circulatory system: Secondary | ICD-10-CM | POA: Diagnosis not present

## 2018-10-01 DIAGNOSIS — Z9119 Patient's noncompliance with other medical treatment and regimen: Secondary | ICD-10-CM | POA: Diagnosis not present

## 2018-10-01 DIAGNOSIS — Z66 Do not resuscitate: Secondary | ICD-10-CM | POA: Diagnosis not present

## 2018-10-01 DIAGNOSIS — R451 Restlessness and agitation: Secondary | ICD-10-CM | POA: Diagnosis not present

## 2018-10-01 DIAGNOSIS — F05 Delirium due to known physiological condition: Secondary | ICD-10-CM | POA: Diagnosis present

## 2018-10-01 DIAGNOSIS — Z681 Body mass index (BMI) 19 or less, adult: Secondary | ICD-10-CM | POA: Diagnosis not present

## 2018-10-01 DIAGNOSIS — F419 Anxiety disorder, unspecified: Secondary | ICD-10-CM | POA: Diagnosis present

## 2018-10-01 DIAGNOSIS — I639 Cerebral infarction, unspecified: Secondary | ICD-10-CM | POA: Diagnosis not present

## 2018-10-01 DIAGNOSIS — N390 Urinary tract infection, site not specified: Secondary | ICD-10-CM | POA: Diagnosis not present

## 2018-10-01 DIAGNOSIS — R64 Cachexia: Secondary | ICD-10-CM | POA: Diagnosis present

## 2018-10-01 DIAGNOSIS — Z20828 Contact with and (suspected) exposure to other viral communicable diseases: Secondary | ICD-10-CM | POA: Diagnosis not present

## 2018-10-01 DIAGNOSIS — Z7189 Other specified counseling: Secondary | ICD-10-CM

## 2018-10-01 DIAGNOSIS — F319 Bipolar disorder, unspecified: Secondary | ICD-10-CM | POA: Diagnosis present

## 2018-10-01 DIAGNOSIS — F0391 Unspecified dementia with behavioral disturbance: Secondary | ICD-10-CM | POA: Diagnosis not present

## 2018-10-01 DIAGNOSIS — I6782 Cerebral ischemia: Secondary | ICD-10-CM | POA: Diagnosis not present

## 2018-10-01 DIAGNOSIS — Z515 Encounter for palliative care: Secondary | ICD-10-CM

## 2018-10-01 DIAGNOSIS — F17210 Nicotine dependence, cigarettes, uncomplicated: Secondary | ICD-10-CM | POA: Diagnosis present

## 2018-10-01 DIAGNOSIS — Z1159 Encounter for screening for other viral diseases: Secondary | ICD-10-CM

## 2018-10-01 DIAGNOSIS — N183 Chronic kidney disease, stage 3 (moderate): Secondary | ICD-10-CM | POA: Diagnosis present

## 2018-10-01 DIAGNOSIS — F0390 Unspecified dementia without behavioral disturbance: Secondary | ICD-10-CM | POA: Diagnosis not present

## 2018-10-01 DIAGNOSIS — F23 Brief psychotic disorder: Secondary | ICD-10-CM | POA: Diagnosis not present

## 2018-10-01 DIAGNOSIS — Z8673 Personal history of transient ischemic attack (TIA), and cerebral infarction without residual deficits: Secondary | ICD-10-CM | POA: Diagnosis present

## 2018-10-01 DIAGNOSIS — M810 Age-related osteoporosis without current pathological fracture: Secondary | ICD-10-CM | POA: Diagnosis present

## 2018-10-01 LAB — COMPREHENSIVE METABOLIC PANEL
ALT: 12 U/L (ref 0–44)
AST: 19 U/L (ref 15–41)
Albumin: 4 g/dL (ref 3.5–5.0)
Alkaline Phosphatase: 75 U/L (ref 38–126)
Anion gap: 10 (ref 5–15)
BUN: 26 mg/dL — ABNORMAL HIGH (ref 8–23)
CO2: 23 mmol/L (ref 22–32)
Calcium: 8.9 mg/dL (ref 8.9–10.3)
Chloride: 105 mmol/L (ref 98–111)
Creatinine, Ser: 1.29 mg/dL — ABNORMAL HIGH (ref 0.44–1.00)
GFR calc Af Amer: 48 mL/min — ABNORMAL LOW (ref >60)
GFR calc non Af Amer: 42 mL/min — ABNORMAL LOW (ref >60)
Glucose, Bld: 120 mg/dL — ABNORMAL HIGH (ref 70–99)
Potassium: 4 mmol/L (ref 3.5–5.1)
Sodium: 138 mmol/L (ref 135–145)
Total Bilirubin: 0.5 mg/dL (ref 0.3–1.2)
Total Protein: 7.3 g/dL (ref 6.5–8.1)

## 2018-10-01 LAB — URINALYSIS, COMPLETE (UACMP) WITH MICROSCOPIC
Bilirubin Urine: NEGATIVE
Glucose, UA: NEGATIVE mg/dL
Ketones, ur: NEGATIVE mg/dL
Nitrite: POSITIVE — AB
Protein, ur: 100 mg/dL — AB
Specific Gravity, Urine: 1.017 (ref 1.005–1.030)
WBC, UA: >50 WBC/hpf — ABNORMAL HIGH (ref 0–5)
pH: 8 (ref 5.0–8.0)

## 2018-10-01 LAB — ETHANOL: Alcohol, Ethyl (B): <10 mg/dL (ref <10)

## 2018-10-01 LAB — URINE DRUG SCREEN, QUALITATIVE (ARMC ONLY)
Amphetamines, Ur Screen: NOT DETECTED
Barbiturates, Ur Screen: NOT DETECTED
Benzodiazepine, Ur Scrn: NOT DETECTED
Cannabinoid 50 Ng, Ur ~~LOC~~: NOT DETECTED
Cocaine Metabolite,Ur ~~LOC~~: NOT DETECTED
MDMA (Ecstasy)Ur Screen: NOT DETECTED
Methadone Scn, Ur: NOT DETECTED
Opiate, Ur Screen: NOT DETECTED
Phencyclidine (PCP) Ur S: NOT DETECTED
Tricyclic, Ur Screen: NOT DETECTED

## 2018-10-01 LAB — SALICYLATE LEVEL: Salicylate Lvl: <7 mg/dL (ref 2.8–30.0)

## 2018-10-01 LAB — CBC
HCT: 40 % (ref 36.0–46.0)
Hemoglobin: 13 g/dL (ref 12.0–15.0)
MCH: 32.3 pg (ref 26.0–34.0)
MCHC: 32.5 g/dL (ref 30.0–36.0)
MCV: 99.3 fL (ref 80.0–100.0)
Platelets: 192 10*3/uL (ref 150–400)
RBC: 4.03 MIL/uL (ref 3.87–5.11)
RDW: 14.4 % (ref 11.5–15.5)
WBC: 10.3 10*3/uL (ref 4.0–10.5)
nRBC: 0 % (ref 0.0–0.2)

## 2018-10-01 LAB — TROPONIN I: Troponin I: <0.03 ng/mL (ref <0.03)

## 2018-10-01 LAB — ACETAMINOPHEN LEVEL: Acetaminophen (Tylenol), Serum: <10 ug/mL — ABNORMAL LOW (ref 10–30)

## 2018-10-01 LAB — SARS CORONAVIRUS 2 BY RT PCR (HOSPITAL ORDER, PERFORMED IN ~~LOC~~ HOSPITAL LAB): SARS Coronavirus 2: NEGATIVE

## 2018-10-01 MED ORDER — VITAMIN D3 25 MCG (1000 UNIT) PO TABS
2000.0000 [IU] | ORAL_TABLET | Freq: Every day | ORAL | Status: DC
  Administered 2018-10-01 – 2018-10-10 (×10): 2000 [IU] via ORAL
  Filled 2018-10-01 (×19): qty 2

## 2018-10-01 MED ORDER — DONEPEZIL HCL 5 MG PO TABS
5.0000 mg | ORAL_TABLET | Freq: Every day | ORAL | Status: DC
  Administered 2018-10-01 – 2018-10-03 (×3): 5 mg via ORAL
  Filled 2018-10-01 (×3): qty 1

## 2018-10-01 MED ORDER — ACETAMINOPHEN 325 MG PO TABS: 650.0000 mg | ORAL_TABLET | ORAL | Status: DC | PRN

## 2018-10-01 MED ORDER — ATORVASTATIN CALCIUM 20 MG PO TABS
40.0000 mg | ORAL_TABLET | Freq: Every day | ORAL | Status: DC
  Administered 2018-10-01 – 2018-10-09 (×5): 40 mg via ORAL
  Filled 2018-10-01 (×8): qty 2

## 2018-10-01 MED ORDER — MIRTAZAPINE 15 MG PO TABS
15.0000 mg | ORAL_TABLET | Freq: Every day | ORAL | Status: DC
  Administered 2018-10-01: 15 mg via ORAL
  Filled 2018-10-01: qty 1

## 2018-10-01 MED ORDER — ONDANSETRON HCL 4 MG/2ML IJ SOLN
4.0000 mg | Freq: Four times a day (QID) | INTRAMUSCULAR | Status: DC | PRN
  Administered 2018-10-01 – 2018-10-02 (×2): 4 mg via INTRAVENOUS
  Filled 2018-10-01 (×2): qty 2

## 2018-10-01 MED ORDER — LORAZEPAM 2 MG/ML IJ SOLN
1.0000 mg | Freq: Once | INTRAMUSCULAR | Status: AC
  Administered 2018-10-01: 1 mg via INTRAVENOUS

## 2018-10-01 MED ORDER — SODIUM CHLORIDE 0.9 % IV SOLN
1.0000 g | INTRAVENOUS | Status: DC
  Administered 2018-10-02 – 2018-10-04 (×3): 1 g via INTRAVENOUS
  Filled 2018-10-01 (×3): qty 1

## 2018-10-01 MED ORDER — ACETAMINOPHEN 160 MG/5ML PO SOLN
650.0000 mg | ORAL | Status: DC | PRN
  Filled 2018-10-01: qty 20.3

## 2018-10-01 MED ORDER — SENNOSIDES-DOCUSATE SODIUM 8.6-50 MG PO TABS
1.0000 | ORAL_TABLET | Freq: Every evening | ORAL | Status: DC | PRN
  Administered 2018-10-04: 18:00:00 1 via ORAL

## 2018-10-01 MED ORDER — ENOXAPARIN SODIUM 40 MG/0.4ML ~~LOC~~ SOLN
30.0000 mg | SUBCUTANEOUS | Status: DC
  Administered 2018-10-01 – 2018-10-04 (×2): 30 mg via SUBCUTANEOUS
  Filled 2018-10-01 (×3): qty 0.4

## 2018-10-01 MED ORDER — ASPIRIN EC 81 MG PO TBEC
81.0000 mg | DELAYED_RELEASE_TABLET | Freq: Every day | ORAL | Status: DC
  Administered 2018-10-01 – 2018-10-10 (×10): 81 mg via ORAL
  Filled 2018-10-01 (×10): qty 1

## 2018-10-01 MED ORDER — SODIUM CHLORIDE 0.9 % IV SOLN
1.0000 g | Freq: Once | INTRAVENOUS | Status: AC
  Administered 2018-10-01: 1 g via INTRAVENOUS
  Filled 2018-10-01: qty 10

## 2018-10-01 MED ORDER — ACETAMINOPHEN 650 MG RE SUPP: 650.0000 mg | RECTAL | Status: DC | PRN

## 2018-10-01 MED ORDER — HALOPERIDOL LACTATE 5 MG/ML IJ SOLN
1.0000 mg | Freq: Four times a day (QID) | INTRAMUSCULAR | Status: DC | PRN
  Administered 2018-10-02: 10:00:00 1 mg via INTRAVENOUS
  Filled 2018-10-01: qty 1

## 2018-10-01 MED ORDER — STROKE: EARLY STAGES OF RECOVERY BOOK
Freq: Once | Status: AC
  Administered 2018-10-01: 17:00:00

## 2018-10-01 MED ORDER — SODIUM CHLORIDE 0.9 % IV SOLN
INTRAVENOUS | Status: DC
  Administered 2018-10-01 – 2018-10-05 (×7): via INTRAVENOUS

## 2018-10-01 MED ORDER — LORAZEPAM 2 MG/ML IJ SOLN
INTRAMUSCULAR | Status: AC
  Filled 2018-10-01: qty 1

## 2018-10-01 NOTE — ED Notes (Signed)
Pt more cooperative after ativan

## 2018-10-01 NOTE — ED Notes (Signed)
IV wrapped in gauze to prevent pt from pulling on it
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
## 2018-10-01 NOTE — ED Notes (Signed)
ED TO INPATIENT HANDOFF REPORT  ED Nurse Name and Phone #: Emersyn Kotarski [phone]  S Name/Age/Gender Deborah [name] 73 y.o. female Room/Bed: ED19A/ED19A  Code Status   Code Status: Prior  Home/SNF/Other Home Patient oriented to: self Is this baseline? Yes   Triage Complete: Triage complete  Chief Complaint Combative   Triage Note Pt to ED via POV, pt daughter states that pt has progressive dementia and she has became more combative. Pt has been slapping and pinching her sister. Pt is lashing out in triage stating "get away from me". Pt currently stays at home with her husband and her other daughter who is mentally ill.    Allergies Allergies  Allergen Reactions  . Prednisone Nausea And Vomiting, Nausea Only and Other (See Comments)  . Chlorphen-Pe-Acetaminophen Other (See Comments)    Reaction: unknown    Level of Care/Admitting Diagnosis ED Disposition    ED Disposition Condition Comment   Admit  Hospital Area: [facility] [[id number]]  Level of Care: Med-Surg [16]  Covid Evaluation: N/A  Diagnosis: Stroke [facility]) [[id number]]  Admitting Physician: Willadean Carol DODD [[id number]]  Attending Physician: Willadean Carol DODD [[id number]]  Estimated length of stay: past midnight tomorrow  Certification:: I certify this patient will need inpatient services for at least 2 midnights  PT Class (Do Not Modify): Inpatient [101]  PT Acc Code (Do Not Modify): Private [1]       B Medical/Surgery History Past Medical History:  Diagnosis Date  . Anxiety   . Arthritis   . Asthma   . Bacteriuria 07/31/2016  . Bipolar 1 disorder (HCC)   . Bipolar 1 disorder, mixed, severe (HCC) 07/26/2016  . Cystitis   . Dementia (HCC)   . Depression   . Heart murmur   . IBS (irritable bowel syndrome)   . Osteoporosis   . Spastic colon   . Swallowing problem 11/09/2016  . Thrombocytopenia (HCC) 08/03/2016  . Ulcer    Past Surgical History:  Procedure Laterality Date  . arm surgery Left 2011    fracture repair  . BREAST BIOPSY     LCIS  . CHOLECYSTECTOMY    . COLONOSCOPY  2002  . ELBOW SURGERY    . SHOULDER SURGERY    . SPINE SURGERY    . throat biopsy   2012  . UPPER GI ENDOSCOPY  2011     A IV Location/Drains/Wounds Patient Lines/Drains/Airways Status   Active Line/Drains/Airways    Name:   Placement date:   Placement time:   Site:   Days:   Peripheral IV 10/01/18 Left Antecubital   10/01/18    1211    Antecubital   less than 1          Intake/Output Last 24 hours No intake or output data in the 24 hours ending 10/01/18 1538  Labs/Imaging Results for orders placed or performed during the hospital encounter of 10/01/18 (from the past 48 hour(s))  Comprehensive metabolic panel     Status: Abnormal   Collection Time: 10/01/18 12:16 PM  Result Value Ref Range   Sodium 138 135 - 145 mmol/L   Potassium 4.0 3.5 - 5.1 mmol/L   Chloride 105 98 - 111 mmol/L   CO2 23 22 - 32 mmol/L   Glucose, Bld 120 (H) 70 - 99 mg/dL   BUN 26 (H) 8 - 23 mg/dL   Creatinine, Ser 8.46 (H) 0.44 - 1.00 mg/dL   Calcium 8.9 8.9 - 96.2 mg/dL   Total Protein 7.3  6.5 - 8.1 g/dL   Albumin 4.0 3.5 - 5.0 g/dL   AST 19 15 - 41 U/L   ALT 12 0 - 44 U/L   Alkaline Phosphatase 75 38 - 126 U/L   Total Bilirubin 0.5 0.3 - 1.2 mg/dL   GFR calc non Af Amer 42 (L) >60 mL/min   GFR calc Af Amer 48 (L) >60 mL/min   Anion gap 10 5 - 15    Comment: Performed at [facility], [address] [place], [place] [address]  Ethanol     Status: None   Collection Time: 10/01/18 12:16 PM  Result Value Ref Range   Alcohol, Ethyl (B) <10 <10 mg/dL    Comment: (NOTE) Lowest detectable limit for serum alcohol is 10 mg/dL. For medical purposes only. Performed at [facility], [address] [place], [place] [address]   Salicylate level     Status: None   Collection Time: 10/01/18 12:16 PM  Result Value Ref Range   Salicylate Lvl <7.0 2.8 - 30.0 mg/dL    Comment: Performed at [facility], [address] [place], [place] [address]  Acetaminophen level     Status: Abnormal   Collection Time: 10/01/18 12:16 PM  Result Value Ref Range   Acetaminophen (Tylenol), Serum <10 (L) 10 - 30 ug/mL    Comment: (NOTE) Therapeutic concentrations vary significantly. A range of 10-30 ug/mL  may be an effective concentration for many patients. However, some  are best treated at concentrations outside of this range. Acetaminophen concentrations >150 ug/mL at 4 hours after ingestion  and >50 ug/mL at 12 hours after ingestion are often associated with  toxic reactions. Performed at [facility], [address] [place], [place] [address]   cbc     Status: None   Collection Time: 10/01/18 12:16 PM  Result Value Ref Range   WBC 10.3 4.0 - 10.5 K/uL   RBC 4.03 3.87 - 5.11 MIL/uL   Hemoglobin 13.0 12.0 - 15.0 g/dL   HCT 57.8 46.9 - 62.9 %   MCV 99.3 80.0 - 100.0 fL   MCH 32.3 26.0 - 34.0 pg   MCHC 32.5 30.0 - 36.0 g/dL   RDW 52.8 41.3 - 24.4 %   Platelets 192 150 - 400 K/uL   nRBC 0.0 0.0 - 0.2 %    Comment: Performed at [facility], [address] [place], [place] [address]  Troponin I - Add-On to previous collection     Status: None   Collection Time: 10/01/18 12:16 PM  Result Value Ref Range   Troponin I <0.03 <0.03 ng/mL    Comment: Performed at [facility], [address] [place], [place] [address]  Urinalysis, Complete w Microscopic     Status: Abnormal   Collection Time: 10/01/18 12:25 PM  Result Value Ref Range   Color, Urine YELLOW (A) YELLOW   APPearance CLOUDY (A) CLEAR   Specific Gravity, Urine 1.017 1.005 - 1.030   pH 8.0 5.0 - 8.0   Glucose, UA NEGATIVE NEGATIVE mg/dL   Hgb urine dipstick SMALL (A) NEGATIVE   Bilirubin Urine NEGATIVE NEGATIVE   Ketones, ur NEGATIVE NEGATIVE mg/dL   Protein, ur 440 (A) NEGATIVE mg/dL   Nitrite POSITIVE (A) NEGATIVE   Leukocytes,Ua LARGE (A) NEGATIVE   RBC / HPF 21-50 0 - 5 RBC/hpf    WBC, UA >50 (H) 0 - 5 WBC/hpf   Bacteria, UA MANY (A) NONE SEEN   Squamous Epithelial /  LPF 0-5 0 - 5   WBC Clumps PRESENT    Mucus PRESENT    Hyaline Casts, UA PRESENT    Triple Phosphate Crystal PRESENT     Comment: Performed at [facility], [address] [place], [place] [address]  Urine Drug Screen, Qualitative     Status: None   Collection Time: 10/01/18  1:29 PM  Result Value Ref Range   Tricyclic, Ur Screen NONE DETECTED NONE DETECTED   Amphetamines, Ur Screen NONE DETECTED NONE DETECTED   MDMA (Ecstasy)Ur Screen NONE DETECTED NONE DETECTED   Cocaine Metabolite,Ur [place] NONE DETECTED NONE DETECTED   Opiate, Ur Screen NONE DETECTED NONE DETECTED   Phencyclidine (PCP) Ur S NONE DETECTED NONE DETECTED   Cannabinoid 50 Ng, Ur [place] NONE DETECTED NONE DETECTED   Barbiturates, Ur Screen NONE DETECTED NONE DETECTED   Benzodiazepine, Ur Scrn NONE DETECTED NONE DETECTED   Methadone Scn, Ur NONE DETECTED NONE DETECTED    Comment: (NOTE) Tricyclics + metabolites, urine    Cutoff 1000 ng/mL Amphetamines + metabolites, urine  Cutoff 1000 ng/mL MDMA (Ecstasy), urine              Cutoff 500 ng/mL Cocaine Metabolite, urine          Cutoff 300 ng/mL Opiate + metabolites, urine        Cutoff 300 ng/mL Phencyclidine (PCP), urine         Cutoff 25 ng/mL Cannabinoid, urine                 Cutoff 50 ng/mL Barbiturates + metabolites, urine  Cutoff 200 ng/mL Benzodiazepine, urine              Cutoff 200 ng/mL Methadone, urine                   Cutoff 300 ng/mL The urine drug screen provides only a preliminary, unconfirmed analytical test result and should not be used for non-medical purposes. Clinical consideration and professional judgment should be applied to any positive drug screen result due to possible interfering substances. A more specific alternate chemical method must be used in order to obtain a confirmed analytical result. Gas chromatography / mass spectrometry (GC/MS) is the  preferred confirmat ory method. Performed at [facility], [address], [place], [place] [address]   SARS Coronavirus 2 (CEPHEID - Performed in [name] hospital lab), Hosp Order     Status: None   Collection Time: 10/01/18  1:29 PM  Result Value Ref Range   SARS Coronavirus 2 NEGATIVE NEGATIVE    Comment: (NOTE) If result is NEGATIVE SARS-CoV-2 target nucleic acids are NOT DETECTED. The SARS-CoV-2 RNA is generally detectable in upper and lower  respiratory specimens during the acute phase of infection. The lowest  concentration of SARS-CoV-2 viral copies this assay can detect is 250  copies / mL. A negative result does not preclude SARS-CoV-2 infection  and should not be used as the sole basis for treatment or other  patient management decisions.  A negative result may occur with  improper specimen collection / handling, submission of specimen other  than nasopharyngeal swab, presence of viral mutation(s) within the  areas targeted by this assay, and inadequate number of viral copies  (<250 copies / mL). A negative result must be combined with clinical  observations, patient history, and epidemiological information. If result is POSITIVE SARS-CoV-2 target nucleic acids are DETECTED. The SARS-CoV-2 RNA is generally detectable in upper and lower  respiratory  specimens dur ing the acute phase of infection.  Positive  results are indicative of active infection with SARS-CoV-2.  Clinical  correlation with patient history and other diagnostic information is  necessary to determine patient infection status.  Positive results do  not rule out bacterial infection or co-infection with other viruses. If result is PRESUMPTIVE POSTIVE SARS-CoV-2 nucleic acids MAY BE PRESENT.   A presumptive positive result was obtained on the submitted specimen  and confirmed on repeat testing.  While 2019 novel coronavirus  (SARS-CoV-2) nucleic acids may be present in the submitted sample   additional confirmatory testing may be necessary for epidemiological  and / or clinical management purposes  to differentiate between  SARS-CoV-2 and other Sarbecovirus currently known to infect humans.  If clinically indicated additional testing with an alternate test  methodology [phone]) is advised. The SARS-CoV-2 RNA is generally  detectable in upper and lower respiratory sp ecimens during the acute  phase of infection. The expected result is Negative. Fact Sheet for Patients:  BoilerBrush.com.cy Fact Sheet for Healthcare Providers: https://pope.com/ This test is not yet approved or cleared by the [place] FDA and has been authorized for detection and/or diagnosis of SARS-CoV-2 by FDA under an Emergency Use Authorization (EUA).  This EUA will remain in effect (meaning this test can be used) for the duration of the COVID-19 declaration under Section 564(b)(1) of the Act, 21 U.S.C. section 360bbb-3(b)(1), unless the authorization is terminated or revoked sooner. Performed at [facility], [address] [place], [place] [address]    Ct Head Wo Contrast  Result Date: 10/01/2018 CLINICAL DATA:  Altered level of consciousness. Progressive dementia. EXAM: CT HEAD WITHOUT CONTRAST TECHNIQUE: Contiguous axial images were obtained from the base of the skull through the vertex without intravenous contrast. COMPARISON:  02/11/2017 FINDINGS: Brain: No evidence of acute infarction, hemorrhage, hydrocephalus, extra-axial collection or mass lesion/mass effect. There is a peripheral area of encephalomalacia in the left parieto-occipital lobe, image 15/2. This is new since 02/11/2017. The appearance is compatible with either a subacute or chronic infarct. New low-attenuation in the left frontal lobe is also identified, image 19/2 compatible with subacute to chronic cortical infarct. There is mild diffuse low-attenuation within the  subcortical and periventricular white matter compatible with chronic microvascular disease. Prominence of the sulci and ventricles compatible with brain atrophy. Vascular: No hyperdense vessel or unexpected calcification. Skull: Normal. Negative for fracture or focal lesion. Sinuses/Orbits: The paranasal sinuses and mastoid air cells are clear. Other: None. IMPRESSION: 1. No acute findings. 2. There are 2 areas of low attenuation in the left frontal lobe and left parieto-occipital lobe compatible with subacute to chronic infarcts. These are new from 02/09/2017 3. Chronic small vessel ischemic disease and brain atrophy. Electronically Signed   By: Signa Kell M.D.   On: 10/01/2018 13:20    Pending Labs [employer] (From admission, onward)    Start     Ordered   Signed and Held  Hemoglobin A1c  Tomorrow morning,   R     Signed and Held   Signed and Held  Lipid panel  Tomorrow morning,   R    Comments:  Fasting    Signed and Held   Medical illustrator and Held  Basic metabolic panel  Tomorrow morning,   R     Signed and Held   Signed and Held  CBC  Tomorrow morning,   R     Signed and Held   Signed and Held  Urine Culture  Add-on,   R     Signed and Held          Vitals/Pain Today's Vitals   10/01/18 1430 10/01/18 1445 10/01/18 1500 10/01/18 1515  BP:      Pulse:      Resp: 15 18 (!) 22 17  Temp:      TempSrc:      SpO2:        Isolation Precautions No active isolations  Medications Medications  LORazepam (ATIVAN) 2 MG/ML injection (has no administration in time range)  cefTRIAXone (ROCEPHIN) 1 g in sodium chloride 0.9 % 100 mL IVPB (1 g Intravenous New Bag/Given 10/01/18 1534)  LORazepam (ATIVAN) injection 1 mg (1 mg Intravenous Given 10/01/18 1300)    Mobility walks with person assist High fall risk   Focused Assessments Neuro Assessment Handoff:  Swallow screen pass? not needed         Neuro Assessment:   Neuro Checks:      Last Documented NIHSS Modified Score:    Has TPA been given? No If patient is a Neuro Trauma and patient is going to OR before floor call report to 4N Charge nurse: [phone] or [phone]     R Recommendations: See Admitting Provider Note  Report given to:   Additional Notes:  Pt was verbally combative earlier but is more cooperative since getting Ativan

## 2018-10-01 NOTE — Progress Notes (Signed)
Family Meeting Note  Advance Directive:no  Today a meeting took place with the Patient and daughter.  Patient is unable to participate due MC:RFVOHK capacity due to dementia   The following clinical team members were present during this meeting:MD  The following were discussed:Patient's diagnosis: stroke, UTI, Patient's progosis: Unable to determine and Goals for treatment: DNI   Discussed CODE STATUS in detail with patient's daughter, who makes her medical decisions.  Patient's daughter states that patient would never want to be intubated.  She thinks patient would want to undergo cardiac resuscitation.  Will make patient DNI.  Additional follow-up to be provided: prn  Time spent during discussion:20 minutes  Hilton Sinclair, MD

## 2018-10-01 NOTE — ED Triage Notes (Signed)
Pt to ED via POV, pt daughter states that pt has progressive dementia and she has became more combative. Pt has been slapping and pinching her sister. Pt is lashing out in triage stating "get away from me". Pt currently stays at home with her husband and her other daughter who is mentally ill.

## 2018-10-01 NOTE — Progress Notes (Signed)
   10/01/18 2015  Clinical Encounter Type  Visited With Patient  Visit Type Initial  Referral From Nurse  Consult/Referral To Chaplain  Spiritual Encounters  Spiritual Needs Prayer;Emotional  CH entered room and patient was resting on bed. Attendant was present with pt. Patient was alert and awake. Spent a few minutes building rapport. Prayed with patient upon request. Pastoral visits are welcomed.

## 2018-10-01 NOTE — H&P (Signed)
[facility] [place] at [facility]   PATIENT NAME: Deborah [name]    MR#:  [id number]  DATE OF BIRTH:  1946/01/21  DATE OF ADMISSION:  10/01/2018  PRIMARY CARE PHYSICIAN: Reubin Milan, MD   REQUESTING/REFERRING PHYSICIAN: Dionne Bucy, MD  CHIEF COMPLAINT:   Chief Complaint  Patient presents with  . Aggressive Behavior    HISTORY OF PRESENT ILLNESS:  Deborah [name]  is a 73 y.o. female with a known history of bipolar disorder, dementia, anxiety, asthma, osteoporosis, chronic thrombocytopenia, IBS who was brought to the ED by her daughter for worsening combative behavior at home.  Daughter states that patient has been screaming and cussing at her family members.  This is been going on for the last several months, but has been getting progressively worse.  Daughter has also noted that she holds her right arm against her body for the last 1 to 2 months.  This is unusual for her.  At baseline, patient is very sedentary but can walk on her own.  She lives at home with her husband.  In the ED, vitals were unremarkable.  Labs were significant for creatinine 1.29.  UA with large leukocytes, positive nitrites, many bacteria, >50 WBC.  UDS negative.  CT head with subacute to chronic infarcts in the left frontal lobe and left parieto-occipital lobe.  Hospitalists were called for admission.  PAST MEDICAL HISTORY:   Past Medical History:  Diagnosis Date  . Anxiety   . Arthritis   . Asthma   . Bacteriuria 07/31/2016  . Bipolar 1 disorder (HCC)   . Bipolar 1 disorder, mixed, severe (HCC) 07/26/2016  . Cystitis   . Dementia (HCC)   . Depression   . Heart murmur   . IBS (irritable bowel syndrome)   . Osteoporosis   . Spastic colon   . Swallowing problem 11/09/2016  . Thrombocytopenia (HCC) 08/03/2016  . Ulcer     PAST SURGICAL HISTORY:   Past Surgical History:  Procedure Laterality Date  . arm surgery Left 2011   fracture repair  . BREAST BIOPSY     LCIS   . CHOLECYSTECTOMY    . COLONOSCOPY  2002  . ELBOW SURGERY    . SHOULDER SURGERY    . SPINE SURGERY    . throat biopsy   2012  . UPPER GI ENDOSCOPY  2011    SOCIAL HISTORY:   Social History   Tobacco Use  . Smoking status: Current Every Day Smoker    Packs/day: 1.00    Years: 20.00    Pack years: 20.00    Types: Cigarettes    Start date: 03/31/1977  . Smokeless tobacco: Never Used  Substance Use Topics  . Alcohol use: No    Alcohol/week: 0.0 standard drinks    Comment: Ocassional    FAMILY HISTORY:   Family History  Problem Relation Age of Onset  . Heart attack Mother   . Cancer Sister        ovarian  . Cancer Maternal Aunt        breast  . Cancer Maternal Aunt        breast    DRUG ALLERGIES:   Allergies  Allergen Reactions  . Prednisone Nausea And Vomiting, Nausea Only and Other (See Comments)  . Chlorphen-Pe-Acetaminophen Other (See Comments)    Reaction: unknown    REVIEW OF SYSTEMS:   ROS-unable to obtain due to dementia  MEDICATIONS AT HOME:   Prior to Admission medications  Medication Sig Start Date End Date Taking? Authorizing Provider  Cholecalciferol (VITAMIN D) 2000 units CAPS Take 1 capsule (2,000 Units total) by mouth daily. 06/20/17  Yes Plonk, Chrissie NoaWilliam, MD  donepezil (ARICEPT) 5 MG tablet Take 1 tablet (5 mg total) by mouth at bedtime. 08/21/18  Yes Reubin MilanBerglund, Laura H, MD  mirtazapine (REMERON) 15 MG tablet Take 1 tablet (15 mg total) by mouth at bedtime. 08/21/18  Yes Reubin MilanBerglund, Laura H, MD      VITAL SIGNS:  Blood pressure 136/87, pulse 81, temperature (!) 97.5 F (36.4 C), temperature source Axillary, resp. rate 13, SpO2 97 %.  PHYSICAL EXAMINATION:  Physical Exam  GENERAL:  73 y.o.-year-old patient lying in the bed with no acute distress.  EYES: Pupils equal, round, reactive to light and accommodation. No scleral icterus. Extraocular muscles intact.  HEENT: Head atraumatic, normocephalic. Oropharynx and nasopharynx clear.  NECK:   Supple, no jugular venous distention. No thyroid enlargement, no tenderness.  LUNGS: Normal breath sounds bilaterally, no wheezing, rales,rhonchi or crepitation. No use of accessory muscles of respiration.  CARDIOVASCULAR: RRR, S1, S2 normal. No murmurs, rubs, or gallops.  ABDOMEN: Soft, nontender, nondistended. Bowel sounds present. No organomegaly or mass.  EXTREMITIES: No pedal edema, cyanosis, or clubbing.  NEUROLOGIC: Cranial nerves II through XII are intact. Muscle strength 4/5 in the right upper extremity, 5/5 in the remaining extremities. Sensation intact. Gait not checked.  PSYCHIATRIC: The patient is alert.  Does not answer questions of orientation. SKIN: No obvious rash, lesion, or ulcer.   LABORATORY PANEL:   CBC Recent Labs  Lab 10/01/18 1216  WBC 10.3  HGB 13.0  HCT 40.0  PLT 192   ------------------------------------------------------------------------------------------------------------------  Chemistries  Recent Labs  Lab 10/01/18 1216  NA 138  K 4.0  CL 105  CO2 23  GLUCOSE 120*  BUN 26*  CREATININE 1.29*  CALCIUM 8.9  AST 19  ALT 12  ALKPHOS 75  BILITOT 0.5   ------------------------------------------------------------------------------------------------------------------  Cardiac Enzymes Recent Labs  Lab 10/01/18 1216  TROPONINI <0.03   ------------------------------------------------------------------------------------------------------------------  RADIOLOGY:  Ct Head Wo Contrast  Result Date: 10/01/2018 CLINICAL DATA:  Altered level of consciousness. Progressive dementia. EXAM: CT HEAD WITHOUT CONTRAST TECHNIQUE: Contiguous axial images were obtained from the base of the skull through the vertex without intravenous contrast. COMPARISON:  02/11/2017 FINDINGS: Brain: No evidence of acute infarction, hemorrhage, hydrocephalus, extra-axial collection or mass lesion/mass effect. There is a peripheral area of encephalomalacia in the left  parieto-occipital lobe, image 15/2. This is new since 02/11/2017. The appearance is compatible with either a subacute or chronic infarct. New low-attenuation in the left frontal lobe is also identified, image 19/2 compatible with subacute to chronic cortical infarct. There is mild diffuse low-attenuation within the subcortical and periventricular white matter compatible with chronic microvascular disease. Prominence of the sulci and ventricles compatible with brain atrophy. Vascular: No hyperdense vessel or unexpected calcification. Skull: Normal. Negative for fracture or focal lesion. Sinuses/Orbits: The paranasal sinuses and mastoid air cells are clear. Other: None. IMPRESSION: 1. No acute findings. 2. There are 2 areas of low attenuation in the left frontal lobe and left parieto-occipital lobe compatible with subacute to chronic infarcts. These are new from 02/09/2017 3. Chronic small vessel ischemic disease and brain atrophy. Electronically Signed   By: Signa Kellaylor  Stroud M.D.   On: 10/01/2018 13:20      IMPRESSION AND PLAN:   Subacute/chronic strokes- CT head with subacute to chronic strokes in the left frontal and left parieto-occipital lobes. -MRI/MRA  brain -Echo and carotid dopplers -Neurology consult -Start aspirin and Lipitor -Check lipid panel and A1c -PT/OT/SLP consult  UTI- UA consistent with infection.  No signs of sepsis on admission. -Continue ceftriaxone -Urine culture pending  History of dementia- with behavioral disturbances and aggressive behavior -Continue home Aricept and Remeron -Family would like SNF placement  CKD 3- creatinine close to baseline -Avoid nephrotoxic agents -Monitor creatinine  All the records are reviewed and case discussed with ED provider. Management plans discussed with the patient, family and they are in agreement.  CODE STATUS: DNI  TOTAL TIME TAKING CARE OF THIS PATIENT: 45 minutes.    Jinny Blossom Yavuz Kirby M.D on 10/01/2018 at 2:50 PM  Between 7am  to 6pm - Pager - [phone]  After 6pm go to [email or web address] - Social research officer, government  [facility] [place] [facility]  Office  [phone]  CC: Primary care physician; Reubin Milan, MD   Note: This dictation was prepared with Dragon dictation along with smaller phrase technology. Any transcriptional errors that result from this process are unintentional.

## 2018-10-01 NOTE — Social Work (Signed)
Social work consult received.  Clinician consulted with attending physician who raised concerns about patient's safety at home given her combative behaviors and demential .  YARELY BERNAL is a 69-yars old female with a history of behavioral health issues (bipolar, anxiety, depression) and dementia.  [facility] ED visit on 10/01/2018 after family notice worsening combative behaviors.  Patient lives with her husband and an adult daughter who struggles with mental health issues.   Telephone consultation with the patient's legal guardian, Mrs. Benjamine Mola (daughter) [phone]. Mrs.Pricilla Holm,, is worried about her mother. She noted that if her mother's worsening behaviors were due to UTI she would like for her to come home with home health but if behaviors are due to progressive symptoms of dementia, she "would like to consider long term care facility. I want to wait to see what happens."    Plan:  Social worker to evaluate the patient on Mon-Tues

## 2018-10-01 NOTE — ED Notes (Signed)
Pt daughter gave pt food that she brought. Given cola to drink.

## 2018-10-01 NOTE — ED Notes (Addendum)
Attempted to call report- will call back in 10 minutes to [phone]

## 2018-10-01 NOTE — ED Notes (Addendum)
Pt grabbing at nurse and equipment- screaming she wants to go home

## 2018-10-01 NOTE — ED Notes (Signed)
MD at bedside. 

## 2018-10-01 NOTE — Progress Notes (Signed)
Due to pt's dementia, bipolar mood disorder, pt is unable to cooperate with assessment [facility]. Become agitated/cusses and yells to stop and "get away".  Pt can be redirected when becomes calm. Safety sitter with pt currently. Admission profile obtained by dgt via TC. Requested POA papers be presented.

## 2018-10-01 NOTE — ED Provider Notes (Signed)
[facility] Emergency Department Provider Note ____________________________________________   First MD Initiated Contact with Patient 10/01/18 1150     (approximate)  I have reviewed the triage vital signs and the nursing notes.   HISTORY  Chief Complaint Aggressive Behavior  Level 5 caveat: History of present illness limited due to dementia  HPI Deborah [name] is a 73 y.o. female with PMH as noted below including a history of dementia who presents with worsening agitation and confusion, gradual onset over the last few months, and worsening especially in the last several days.  The daughter denies any specific medical symptoms, the patient is unable to voice any complaints.  Per the daughter, the current social situation is difficult as the patient lives with her husband (although he is currently admitted to the hospital) and another daughter who has mental illness of her own.  The daughter who is here feels that the patient is unable to adequately take care of herself at home and that her current living situation is also not adequate.  She reports that the family had wanted to wait until after the COVID-19 pandemic to try to get the patient placed in a facility, but they now think that she may need this sooner.  Past Medical History:  Diagnosis Date   Anxiety    Arthritis    Asthma    Bacteriuria 07/31/2016   Bipolar 1 disorder (HCC)    Bipolar 1 disorder, mixed, severe (HCC) 07/26/2016   Cystitis    Dementia (HCC)    Depression    Heart murmur    IBS (irritable bowel syndrome)    Osteoporosis    Spastic colon    Swallowing problem 11/09/2016   Thrombocytopenia (HCC) 08/03/2016   Ulcer     Patient Active Problem List   Diagnosis Date Noted   Protein-calorie malnutrition, mild (HCC) 02/17/2018   Vitamin D deficiency 06/20/2017   Arthritis 06/17/2017   Hx of peptic ulcer 06/17/2017   IBS (irritable bowel syndrome) 06/17/2017    Unintended weight loss 06/17/2017   CKD (chronic kidney disease) stage 3, GFR 30-59 ml/min (HCC) 06/17/2017   Balance problem 11/09/2016   Late onset Alzheimer's disease with behavioral disturbance (HCC) 11/09/2016   Macrocytic anemia 08/03/2016   Hx of breast cancer 07/28/2016   Folate deficiency 10/02/2015   Bipolar 1 disorder, depressed (HCC) 09/25/2015   Smoker 09/25/2015   B12 deficiency 09/11/2015   Anxiety 07/23/2015   Severe episode of recurrent major depressive disorder (HCC) 07/03/2015   History of migraine headaches 09/08/2014   H/O Bell's palsy 09/08/2014   H/O: HTN (hypertension) 09/08/2014   Gastroesophageal reflux disease without esophagitis 09/08/2014   COPD (chronic obstructive pulmonary disease) (HCC) 09/08/2014   History of asthma 09/08/2014   Breast neoplasm, Tis (LCIS) 01/23/2013    Past Surgical History:  Procedure Laterality Date   arm surgery Left 2011   fracture repair   BREAST BIOPSY     LCIS   CHOLECYSTECTOMY     COLONOSCOPY  2002   ELBOW SURGERY     SHOULDER SURGERY     SPINE SURGERY     throat biopsy   2012   UPPER GI ENDOSCOPY  2011    Prior to Admission medications   Medication Sig Start Date End Date Taking? Authorizing Provider  Cholecalciferol (VITAMIN D) 2000 units CAPS Take 1 capsule (2,000 Units total) by mouth daily. 06/20/17  Yes Plonk, Chrissie NoaWilliam, MD  donepezil (ARICEPT) 5 MG tablet Take 1 tablet (5  mg total) by mouth at bedtime. 08/21/18  Yes Reubin Milan, MD  mirtazapine (REMERON) 15 MG tablet Take 1 tablet (15 mg total) by mouth at bedtime. 08/21/18  Yes Reubin Milan, MD    Allergies Prednisone and Chlorphen-pe-acetaminophen  Family History  Problem Relation Age of Onset   Heart attack Mother    Cancer Sister        ovarian   Cancer Maternal Aunt        breast   Cancer Maternal Aunt        breast    Social History Social History   Tobacco Use   Smoking status: Current Every Day  Smoker    Packs/day: 1.00    Years: 20.00    Pack years: 20.00    Types: Cigarettes    Start date: 03/31/1977   Smokeless tobacco: Never Used  Substance Use Topics   Alcohol use: No    Alcohol/week: 0.0 standard drinks    Comment: Ocassional   Drug use: No    Review of Systems Level 5 caveat: Unable to obtain review of systems due to dementia    ____________________________________________   PHYSICAL EXAM:  VITAL SIGNS: ED Triage Vitals  Enc Vitals Group     BP 10/01/18 1139 (!) 156/75     Pulse Rate 10/01/18 1139 75     Resp --      Temp 10/01/18 1157 (!) 97.5 F (36.4 C)     Temp Source 10/01/18 1157 Axillary     SpO2 10/01/18 1139 99 %     Weight --      Height --      Head Circumference --      Peak Flow --      Pain Score --      Pain Loc --      Pain Edu? --      Excl. in GC? --     Constitutional: Alert, confused, anxious appearing. Eyes: Conjunctivae are normal.  EOMI. Head: Atraumatic. Nose: No congestion/rhinnorhea. Mouth/Throat: Mucous membranes are moist.   Neck: Normal range of motion.  Cardiovascular: Good peripheral circulation. Respiratory: Normal respiratory effort.  Gastrointestinal: No distention.  Musculoskeletal: Extremities warm and well perfused.  Neurologic: Motor intact in all extremities. Skin:  Skin is warm and dry. No rash noted. Psychiatric: Anxious appearing and intermittently agitated and combative.  ____________________________________________   LABS (all labs ordered are listed, but only abnormal results are displayed)  Labs Reviewed  COMPREHENSIVE METABOLIC PANEL - Abnormal; Notable for the following components:      Result Value   Glucose, Bld 120 (*)    BUN 26 (*)    Creatinine, Ser 1.29 (*)    GFR calc non Af Amer 42 (*)    GFR calc Af Amer 48 (*)    All other components within normal limits  ACETAMINOPHEN LEVEL - Abnormal; Notable for the following components:   Acetaminophen (Tylenol), Serum <10 (*)     All other components within normal limits  URINALYSIS, COMPLETE (UACMP) WITH MICROSCOPIC - Abnormal; Notable for the following components:   Color, Urine YELLOW (*)    APPearance CLOUDY (*)    Hgb urine dipstick SMALL (*)    Protein, ur 100 (*)    Nitrite POSITIVE (*)    Leukocytes,Ua LARGE (*)    WBC, UA >50 (*)    Bacteria, UA MANY (*)    All other components within normal limits  SARS CORONAVIRUS 2 (HOSPITAL ORDER, PERFORMED IN [facility])  ETHANOL  SALICYLATE LEVEL  CBC  URINE DRUG SCREEN, QUALITATIVE ([facility] ONLY)  TROPONIN I   ____________________________________________  EKG  ED ECG REPORT I, Dionne Bucy, the attending physician, personally viewed and interpreted this ECG.  Date: 10/01/2018 EKG Time: 1325 Rate: 80 Rhythm: normal sinus rhythm QRS Axis: normal Intervals: normal ST/T Wave abnormalities: Nonspecific ST abnormalities Narrative Interpretation: Nonspecific abnormalities with no evidence of acute ischemia  ____________________________________________  RADIOLOGY  CT head: Possible left frontal and parietal subacute to chronic infarcts. No acute abnormality  ____________________________________________   PROCEDURES  Procedure(s) performed: No  Procedures  Critical Care performed: No ____________________________________________   INITIAL IMPRESSION / ASSESSMENT AND PLAN / ED COURSE  Pertinent labs & imaging results that were available during my care of the patient were reviewed by me and considered in my medical decision making (see chart for details).  73 year old female with PMH as noted below presents with worsening confusion and agitation over the last few months, but these have acutely worsened in the last week since her husband was admitted to the hospital.  The daughter reports that the patient's current living situation is not adequate as the daughter has 4 children of her own and other family members are unable to take  care of the patient full-time.  The daughter reports that the patient has not had any specific medical symptoms.  She also has not attempted to hurt herself or anyone else.  On exam, the patient is comfortable appearing but she is very anxious whenever new person walks in the room, and she intermittently becomes agitated and combative.  She is verbally redirectable.  Neuro exam is nonfocal.  The vital signs are normal.  The remainder of the exam is unremarkable.  Overall presentation is consistent with advancing dementia.  We will obtain CT head and medical work-up to evaluate for organic causes of her worsening mental status.  If this work-up is negative then the patient will need social work evaluation for possible placement.  ----------------------------------------- 2:53 PM on 10/01/2018 -----------------------------------------  Lab work-up reveals findings consistent with a UTI which certainly could contribute to the patient's worsening altered mental status.  CT also shows subacute to chronic infarct in the frontal lobe which may be playing a role as well.  Given the acute infection, we will admit the patient for IV antibiotics as I do not think that she would be able to tolerate oral antibiotics at home or at a facility at this time.  The daughter agrees with the plan.  I also consulted social work to start disposition planning. ____________________________________________   FINAL CLINICAL IMPRESSION(S) / ED DIAGNOSES  Final diagnoses:  Urinary tract infection without hematuria, site unspecified  Altered mental status, unspecified altered mental status type      NEW MEDICATIONS STARTED DURING THIS VISIT:  New Prescriptions   No medications on file     Note:  This document was prepared using Dragon voice recognition software and may include unintentional dictation errors.    Dionne Bucy, MD 10/01/18 1455

## 2018-10-01 NOTE — ED Notes (Signed)
Patient transported to CT 

## 2018-10-01 NOTE — ED Notes (Signed)
Report given to Jane RN

## 2018-10-02 ENCOUNTER — Inpatient Hospital Stay: Admit: 2018-10-02 | Payer: Medicare HMO

## 2018-10-02 ENCOUNTER — Inpatient Hospital Stay: Payer: Medicare HMO

## 2018-10-02 DIAGNOSIS — R4182 Altered mental status, unspecified: Secondary | ICD-10-CM

## 2018-10-02 LAB — CBC
HCT: 34.5 % — ABNORMAL LOW (ref 36.0–46.0)
Hemoglobin: 11.3 g/dL — ABNORMAL LOW (ref 12.0–15.0)
MCH: 32 pg (ref 26.0–34.0)
MCHC: 32.8 g/dL (ref 30.0–36.0)
MCV: 97.7 fL (ref 80.0–100.0)
Platelets: 158 10*3/uL (ref 150–400)
RBC: 3.53 MIL/uL — ABNORMAL LOW (ref 3.87–5.11)
RDW: 14.4 % (ref 11.5–15.5)
WBC: 6.8 10*3/uL (ref 4.0–10.5)
nRBC: 0 % (ref 0.0–0.2)

## 2018-10-02 LAB — LIPID PANEL
Cholesterol: 156 mg/dL (ref 0–200)
HDL: 35 mg/dL — ABNORMAL LOW (ref >40)
LDL Cholesterol: 102 mg/dL — ABNORMAL HIGH (ref 0–99)
Total CHOL/HDL Ratio: 4.5 RATIO
Triglycerides: 96 mg/dL (ref <150)
VLDL: 19 mg/dL (ref 0–40)

## 2018-10-02 LAB — BASIC METABOLIC PANEL
Anion gap: 8 (ref 5–15)
BUN: 32 mg/dL — ABNORMAL HIGH (ref 8–23)
CO2: 21 mmol/L — ABNORMAL LOW (ref 22–32)
Calcium: 8 mg/dL — ABNORMAL LOW (ref 8.9–10.3)
Chloride: 111 mmol/L (ref 98–111)
Creatinine, Ser: 1.34 mg/dL — ABNORMAL HIGH (ref 0.44–1.00)
GFR calc Af Amer: 46 mL/min — ABNORMAL LOW (ref >60)
GFR calc non Af Amer: 40 mL/min — ABNORMAL LOW (ref >60)
Glucose, Bld: 84 mg/dL (ref 70–99)
Potassium: 3.5 mmol/L (ref 3.5–5.1)
Sodium: 140 mmol/L (ref 135–145)

## 2018-10-02 LAB — HEMOGLOBIN A1C
Hgb A1c MFr Bld: 5.1 % (ref 4.8–5.6)
Mean Plasma Glucose: 99.67 mg/dL

## 2018-10-02 MED ORDER — MIRTAZAPINE 15 MG PO TABS
30.0000 mg | ORAL_TABLET | Freq: Every day | ORAL | Status: DC
  Administered 2018-10-02: 23:00:00 30 mg via ORAL
  Filled 2018-10-02: qty 2

## 2018-10-02 NOTE — Progress Notes (Signed)
OT Cancellation Note  Patient Details Name: Deborah [name] MRN: [id number] DOB: 1945-11-12   Cancelled Treatment:    Reason Eval/Treat Not Completed: Other (comment) OT walked by pt room with PT, was able to hear this pt yelling,  restless. PT spoke with RN, confirmed pt unable to follow commands at this time and may be more appropriate at a later time.Will follow acutely and re-attempt at a later time/date as available and pt medically appropriate for OT.   Rockney Ghee, M.S., OTR/L Ascom: [phone] 10/02/18, 9:08 AM

## 2018-10-02 NOTE — Progress Notes (Signed)
[facility] [place] [facility]   PATIENT NAME: Donavan BurnetBetty Gauthier    MR#:  [id number]  DATE OF BIRTH:  January 25, 1946  SUBJECTIVE:  CHIEF COMPLAINT: Patient with intermittent episodes of agitation.  Sitter next to her REVIEW OF SYSTEMS:  Review of system unobtainable from baseline dementia  DRUG ALLERGIES:   Allergies  Allergen Reactions  . Prednisone Nausea And Vomiting, Nausea Only and Other (See Comments)  . Chlorphen-Pe-Acetaminophen Other (See Comments)    Reaction: unknown    VITALS:  Blood pressure 121/69, pulse 69, temperature 98.3 F (36.8 C), resp. rate 18, height 5\' 3"  (1.6 m), weight 46.6 kg, SpO2 97 %.  PHYSICAL EXAMINATION:  GENERAL:  73 y.o.-year-old patient lying in the bed with no acute distress.  EYES: Pupils equal, round, reactive to light and accommodation. No scleral icterus. Extraocular muscles intact.  HEENT: Head atraumatic, normocephalic. Oropharynx and nasopharynx clear.  NECK:  Supple, no jugular venous distention. No thyroid enlargement, no tenderness.  LUNGS: Normal breath sounds bilaterally, no wheezing, rales,rhonchi or crepitation. No use of accessory muscles of respiration.  CARDIOVASCULAR: S1, S2 normal. No murmurs, rubs, or gallops.  ABDOMEN: Soft, nontender, nondistended. Bowel sounds present.  EXTREMITIES: No pedal edema, cyanosis, or clubbing.  NEUROLOGIC: Awake alert, altered sensation intact. Gait not checked.  PSYCHIATRIC: The patient is alert and disoriented.  Baseline dementia SKIN: No obvious rash, lesion, or ulcer.    LABORATORY PANEL:   CBC Recent Labs  Lab 10/02/18 0555  WBC 6.8  HGB 11.3*  HCT 34.5*  PLT 158   ------------------------------------------------------------------------------------------------------------------  Chemistries  Recent Labs  Lab 10/01/18 1216 10/02/18 0555  NA 138 140  K 4.0 3.5  CL 105 111  CO2 23 21*  GLUCOSE 120* 84  BUN 26* 32*  CREATININE 1.29* 1.34*   CALCIUM 8.9 8.0*  AST 19  --   ALT 12  --   ALKPHOS 75  --   BILITOT 0.5  --    ------------------------------------------------------------------------------------------------------------------  Cardiac Enzymes Recent Labs  Lab 10/01/18 1216  TROPONINI <0.03   ------------------------------------------------------------------------------------------------------------------  RADIOLOGY:  Ct Head Wo Contrast  Result Date: 10/01/2018 CLINICAL DATA:  Altered level of consciousness. Progressive dementia. EXAM: CT HEAD WITHOUT CONTRAST TECHNIQUE: Contiguous axial images were obtained from the base of the skull through the vertex without intravenous contrast. COMPARISON:  02/11/2017 FINDINGS: Brain: No evidence of acute infarction, hemorrhage, hydrocephalus, extra-axial collection or mass lesion/mass effect. There is a peripheral area of encephalomalacia in the left parieto-occipital lobe, image 15/2. This is new since 02/11/2017. The appearance is compatible with either a subacute or chronic infarct. New low-attenuation in the left frontal lobe is also identified, image 19/2 compatible with subacute to chronic cortical infarct. There is mild diffuse low-attenuation within the subcortical and periventricular white matter compatible with chronic microvascular disease. Prominence of the sulci and ventricles compatible with brain atrophy. Vascular: No hyperdense vessel or unexpected calcification. Skull: Normal. Negative for fracture or focal lesion. Sinuses/Orbits: The paranasal sinuses and mastoid air cells are clear. Other: None. IMPRESSION: 1. No acute findings. 2. There are 2 areas of low attenuation in the left frontal lobe and left parieto-occipital lobe compatible with subacute to chronic infarcts. These are new from 02/09/2017 3. Chronic small vessel ischemic disease and brain atrophy. Electronically Signed   By: Signa Kellaylor  Stroud M.D.   On: 10/01/2018 13:20   Mr Brain Wo Contrast  Result Date:  10/01/2018 CLINICAL DATA:  73 y/o F; altered mental status, progressive dementia, holding  right arm against body for 1-2 months. EXAM: MRI HEAD WITHOUT CONTRAST MRA HEAD WITHOUT CONTRAST TECHNIQUE: Multiplanar, multiecho pulse sequences of the brain and surrounding structures were obtained without intravenous contrast. Angiographic images of the head were obtained using MRA technique without contrast. COMPARISON:  10/01/2018 CT head.  03/21/2009 MRI head. FINDINGS: MRI HEAD FINDINGS Brain: No acute infarction, hemorrhage, hydrocephalus, extra-axial collection or mass lesion. Small chronic cortical infarctions are present within the left middle frontal gyrus and the left parieto-occipital junction. The pattern of chronic infarctions suggests a watershed distribution. Punctate and early confluent nonspecific T2 FLAIR hyperintensities in subcortical and periventricular white matter are compatible with mild to moderate chronic microvascular ischemic changes. Moderate volume loss of the brain. Vascular: As below. Skull and upper cervical spine: Normal marrow signal. Sinuses/Orbits: Negative. Other: Bilateral intra-ocular lens replacement. MRA HEAD FINDINGS Mild motion artifact at multiple levels. Internal carotid arteries:  Patent. Anterior cerebral arteries:  Patent. Middle cerebral arteries: Patent. Anterior communicating artery: Short right A1 and long anterior communicating artery, normal variant. Posterior communicating arteries:  Patent. Posterior cerebral arteries:  Patent. Basilar artery:  Patent. Vertebral arteries:  Patent. No evidence of high-grade stenosis, large vessel occlusion, or aneurysm identified. IMPRESSION: MRI head: 1. No acute intracranial abnormality. 2. 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 The pattern suggests a watershed distribution. 3. Mild-to-moderate chronic microvascular ischemic changes and moderate volume loss of the  brain. MRA head: 1. Mild motion artifact at multiple levels. 2. Patent anterior and posterior intracranial circulation. No large vessel occlusion, aneurysm, or significant stenosis is identified. Electronically Signed   By: Mitzi Hansen M.D.   On: 10/01/2018 21:31   Mr Maxine Glenn Head/brain LO Cm  Result Date: 10/01/2018 CLINICAL DATA:  73 y/o F; altered mental status, progressive dementia, holding right arm against body for 1-2 months. EXAM: MRI HEAD WITHOUT CONTRAST MRA HEAD WITHOUT CONTRAST TECHNIQUE: Multiplanar, multiecho pulse sequences of the brain and surrounding structures were obtained without intravenous contrast. Angiographic images of the head were obtained using MRA technique without contrast. COMPARISON:  10/01/2018 CT head.  03/21/2009 MRI head. FINDINGS: MRI HEAD FINDINGS Brain: No acute infarction, hemorrhage, hydrocephalus, extra-axial collection or mass lesion. Small chronic cortical infarctions are present within the left middle frontal gyrus and the left parieto-occipital junction. The pattern of chronic infarctions suggests a watershed distribution. Punctate and early confluent nonspecific T2 FLAIR hyperintensities in subcortical and periventricular white matter are compatible with mild to moderate chronic microvascular ischemic changes. Moderate volume loss of the brain. Vascular: As below. Skull and upper cervical spine: Normal marrow signal. Sinuses/Orbits: Negative. Other: Bilateral intra-ocular lens replacement. MRA HEAD FINDINGS Mild motion artifact at multiple levels. Internal carotid arteries:  Patent. Anterior cerebral arteries:  Patent. Middle cerebral arteries: Patent. Anterior communicating artery: Short right A1 and long anterior communicating artery, normal variant. Posterior communicating arteries:  Patent. Posterior cerebral arteries:  Patent. Basilar artery:  Patent. Vertebral arteries:  Patent. No evidence of high-grade stenosis, large vessel occlusion, or aneurysm  identified. IMPRESSION: MRI head: 1. No acute intracranial abnormality. 2. 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 The pattern suggests a watershed distribution. 3. Mild-to-moderate chronic microvascular ischemic changes and moderate volume loss of the brain. MRA head: 1. Mild motion artifact at multiple levels. 2. Patent anterior and posterior intracranial circulation. No large vessel occlusion, aneurysm, or significant stenosis is identified. Electronically Signed   By: Buzzy Han.D.  On: 10/01/2018 21:31    EKG:   Orders placed or performed during the hospital encounter of 10/01/18  . ED EKG  . ED EKG  . EKG 12-Lead  . EKG 12-Lead    ASSESSMENT AND PLAN:   #Delirium with underlying dementia from UTI Urine cultures are pending continue ceftriaxone TSH normal  #Subacute or chronic stroke-ruled out MRI, MRA of the brain negative Patient was not cooperating with echocardiogram Seen by neurology and recommending to increase Remeron to 30 mg nightly PT could not assess patient as patient is unable to follow commands at this time LDL 102   #Chronic history of dementia at the intermittent aggressive behavior Continue Aricept Remeron dose increased to 30 mg as recommended by neurology  #Chronic kidney disease stage III creatinine close to baseline Avoid nephrotoxins and monitor renal function closely      All the records are reviewed and case discussed with Care Management/Social Workerr.   CODE STATUS: Partial code  TOTAL TIME TAKING CARE OF THIS PATIENT:36  minutes.   POSSIBLE D/C IN 2 DAYS, DEPENDING ON CLINICAL CONDITION.  Note: This dictation was prepared with Dragon dictation along with smaller phrase technology. Any transcriptional errors that result from this process are unintentional.   Ramonita Lab M.D on 10/02/2018 at 2:41 PM  Between 7am to 6pm - Pager - [phone] After 6pm  go to [email or web address] - password [email or web address] [facility]  [place] [facility]  Office  [phone]  CC: Primary care physician; Reubin Milan, MD

## 2018-10-02 NOTE — Progress Notes (Signed)
PT Cancellation Note  Patient Details Name: Deborah [name] MRN: [id number] DOB: May 25, 1945   Cancelled Treatment:    Reason Eval/Treat Not Completed: Other (comment)(PT walked by pt room, was able to hear pt yelling,  restless. Spoke with RN, confirmed pt unable to follow commands at this time and may be more appropriate at a later time.)   Olga Coaster PT, DPT 9:07 AM,10/02/18 [phone]

## 2018-10-02 NOTE — Consult Note (Signed)
Referring Physician: Gouru    Chief Complaint: Confusion  HPI: Deborah [name] is an 73 y.o. female with a history of dementia who is unable to provide any history today therefore all history obtained form the chart.  Patient brought to the ED on yesterday due to combative behavior at home.  Daughter stated that patient had been screaming and cussing at her family members.  This is been going on for the last several months, but has been getting progressively worse.  Daughter has also noted that she holds her right arm against her body for the last 1 to 2 months.  This is unusual for her.  At baseline, patient is very sedentary but can walk on her own.  She lives at home with her husband.  Date last known well: Unable to determine Time last known well: Unable to determine tPA Given: No: Unable to determine LKW  Past Medical History:  Diagnosis Date  . Anxiety   . Arthritis   . Asthma   . Bacteriuria 07/31/2016  . Bipolar 1 disorder (HCC)   . Bipolar 1 disorder, mixed, severe (HCC) 07/26/2016  . Cystitis   . Dementia (HCC)   . Depression   . Heart murmur   . IBS (irritable bowel syndrome)   . Osteoporosis   . Spastic colon   . Swallowing problem 11/09/2016  . Thrombocytopenia (HCC) 08/03/2016  . Ulcer     Past Surgical History:  Procedure Laterality Date  . arm surgery Left 2011   fracture repair  . BREAST BIOPSY     LCIS  . CHOLECYSTECTOMY    . COLONOSCOPY  2002  . ELBOW SURGERY    . SHOULDER SURGERY    . SPINE SURGERY    . throat biopsy   2012  . UPPER GI ENDOSCOPY  2011    Family History  Problem Relation Age of Onset  . Heart attack Mother   . Cancer Sister        ovarian  . Cancer Maternal Aunt        breast  . Cancer Maternal Aunt        breast   Social History:  reports that she has been smoking cigarettes. She started smoking about 41 years ago. She has a 20.00 pack-year smoking history. She has never used smokeless tobacco. She reports that she does not  drink alcohol or use drugs.  Allergies:  Allergies  Allergen Reactions  . Prednisone Nausea And Vomiting, Nausea Only and Other (See Comments)  . Chlorphen-Pe-Acetaminophen Other (See Comments)    Reaction: unknown    Medications:  I have reviewed the patient's current medications. Prior to Admission:  Medications Prior to Admission  Medication Sig Dispense Refill Last Dose  . Cholecalciferol (VITAMIN D) 2000 units CAPS Take 1 capsule (2,000 Units total) by mouth daily. 30 capsule  Past Week at Unknown time  . donepezil (ARICEPT) 5 MG tablet Take 1 tablet (5 mg total) by mouth at bedtime. 90 tablet 3 Past Week at Unknown time  . mirtazapine (REMERON) 15 MG tablet Take 1 tablet (15 mg total) by mouth at bedtime. 90 tablet 3 Past Week at Unknown time   Scheduled: . aspirin EC  81 mg Oral Daily  . atorvastatin  40 mg Oral q1800  . cholecalciferol  2,000 Units Oral Daily  . donepezil  5 mg Oral QHS  . enoxaparin (LOVENOX) injection  30 mg Subcutaneous Q24H  . mirtazapine  15 mg Oral QHS    ROS:  Unable to obtain due to mental status  Physical Examination: Blood pressure 121/69, pulse 69, temperature 98.3 F (36.8 C), resp. rate 18, height  (1.6 m), weight 46.6 kg, SpO2 97 %.  HEENT-  Normocephalic, no lesions, without obvious abnormality.  Normal external eye and conjunctiva.  Normal external ears. Normal external nose Cardiovascular- Unable to examine due to cooperation  Lungs- Unable to examine due to cooperation  Abdomen- Unable to examine due to cooperation  Extremities- no edema Lymph-Unable to examine due to cooperation  Musculoskeletal-no joint tenderness, deformity or swelling Skin-warm and dry, no hyperpigmentation, vitiligo, or suspicious lesions  Neurological Examination   Mental Status: Alert.  Uncooperative and combative.  Yelling but fluent.  Follows simple commands. Cranial Nerves: II: Blinks to bilateral confrontation.   III,IV, VI: ptosis not present,  extra-ocular motions intact bilaterally V,VII: smile symmetric, facial light touch sensation normal bilaterally VIII: Unable to examine due to cooperation  IX,X: Unable to examine due to cooperation  XI: Unable to examine due to cooperation  XII: midline tongue extension Motor: Moves all extremities against gravity but does lift RUE less than LUE Sensory: Unable to examine due to cooperation  Deep Tendon Reflexes: Unable to examine due to cooperation  Plantars: Unable to examine due to cooperation  Cerebellar: Unable to examine due to cooperation  Gait: not tested due to safety concerns  Laboratory Studies:  Basic Metabolic Panel: Recent Labs  Lab 10/01/18 1216 10/02/18 0555  NA 138 140  K 4.0 3.5  CL 105 111  CO2 23 21*  GLUCOSE 120* 84  BUN 26* 32*  CREATININE 1.29* 1.34*  CALCIUM 8.9 8.0*    Liver Function Tests: Recent Labs  Lab 10/01/18 1216  AST 19  ALT 12  ALKPHOS 75  BILITOT 0.5  PROT 7.3  ALBUMIN 4.0   No results for input(s): LIPASE, AMYLASE in the last 168 hours. No results for input(s): AMMONIA in the last 168 hours.  CBC: Recent Labs  Lab 10/01/18 1216 10/02/18 0555  WBC 10.3 6.8  HGB 13.0 11.3*  HCT 40.0 34.5*  MCV 99.3 97.7  PLT 192 158    Cardiac Enzymes: Recent Labs  Lab 10/01/18 1216  TROPONINI <0.03    BNP: Invalid input(s): POCBNP  CBG: No results for input(s): GLUCAP in the last 168 hours.  Microbiology: Results for orders placed or performed during the hospital encounter of 10/01/18  SARS Coronavirus 2 (CEPHEID - Performed in [facility] hospital lab), Hosp Order     Status: None   Collection Time: 10/01/18  1:29 PM  Result Value Ref Range Status   SARS Coronavirus 2 NEGATIVE NEGATIVE Final    Comment: (NOTE) If result is NEGATIVE SARS-CoV-2 target nucleic acids are NOT DETECTED. The SARS-CoV-2 RNA is generally detectable in upper and lower  respiratory specimens during the acute phase of infection. The lowest   concentration of SARS-CoV-2 viral copies this assay can detect is 250  copies / mL. A negative result does not preclude SARS-CoV-2 infection  and should not be used as the sole basis for treatment or other  patient management decisions.  A negative result may occur with  improper specimen collection / handling, submission of specimen other  than nasopharyngeal swab, presence of viral mutation(s) within the  areas targeted by this assay, and inadequate number of viral copies  (<250 copies / mL). A negative result must be combined with clinical  observations, patient history, and epidemiological information. If result is POSITIVE SARS-CoV-2 target nucleic  acids are DETECTED. The SARS-CoV-2 RNA is generally detectable in upper and lower  respiratory specimens dur ing the acute phase of infection.  Positive  results are indicative of active infection with SARS-CoV-2.  Clinical  correlation with patient history and other diagnostic information is  necessary to determine patient infection status.  Positive results do  not rule out bacterial infection or co-infection with other viruses. If result is PRESUMPTIVE POSTIVE SARS-CoV-2 nucleic acids MAY BE PRESENT.   A presumptive positive result was obtained on the submitted specimen  and confirmed on repeat testing.  While 2019 novel coronavirus  (SARS-CoV-2) nucleic acids may be present in the submitted sample  additional confirmatory testing may be necessary for epidemiological  and / or clinical management purposes  to differentiate between  SARS-CoV-2 and other Sarbecovirus currently known to infect humans.  If clinically indicated additional testing with an alternate test  methodology [phone]) is advised. The SARS-CoV-2 RNA is generally  detectable in upper and lower respiratory sp ecimens during the acute  phase of infection. The expected result is Negative. Fact Sheet for Patients:  BoilerBrush.com.cy Fact Sheet  for Healthcare Providers: https://pope.com/ This test is not yet approved or cleared by the [place] FDA and has been authorized for detection and/or diagnosis of SARS-CoV-2 by FDA under an Emergency Use Authorization (EUA).  This EUA will remain in effect (meaning this test can be used) for the duration of the COVID-19 declaration under Section 564(b)(1) of the Act, 21 U.S.C. section 360bbb-3(b)(1), unless the authorization is terminated or revoked sooner. Performed at [facility], [address] [place], [place] [address]     Coagulation Studies: No results for input(s): LABPROT, INR in the last 72 hours.  Urinalysis:  Recent Labs  Lab 10/01/18 1225  COLORURINE YELLOW*  LABSPEC 1.017  PHURINE 8.0  GLUCOSEU NEGATIVE  HGBUR SMALL*  BILIRUBINUR NEGATIVE  KETONESUR NEGATIVE  PROTEINUR 100*  NITRITE POSITIVE*  LEUKOCYTESUR LARGE*    Lipid Panel:    Component Value Date/Time   CHOL 156 10/02/2018 0555   TRIG 96 10/02/2018 0555   HDL 35 (L) 10/02/2018 0555   CHOLHDL 4.5 10/02/2018 0555   VLDL 19 10/02/2018 0555   LDLCALC 102 (H) 10/02/2018 0555    HgbA1C:  Lab Results  Component Value Date   HGBA1C 5.1 09/26/2015    Urine Drug Screen:      Component Value Date/Time   LABOPIA NONE DETECTED 10/01/2018 1329   COCAINSCRNUR NONE DETECTED 10/01/2018 1329   LABBENZ NONE DETECTED 10/01/2018 1329   AMPHETMU NONE DETECTED 10/01/2018 1329   THCU NONE DETECTED 10/01/2018 1329   LABBARB NONE DETECTED 10/01/2018 1329    Alcohol Level:  Recent Labs  Lab 10/01/18 1216  ETH <10    Other results: EKG: sinus rhythm at 80 bpm.  Imaging: Ct Head Wo Contrast  Result Date: 10/01/2018 CLINICAL DATA:  Altered level of consciousness. Progressive dementia. EXAM: CT HEAD WITHOUT CONTRAST TECHNIQUE: Contiguous axial images were obtained from the base of the skull through the vertex without intravenous contrast. COMPARISON:  02/11/2017  FINDINGS: Brain: No evidence of acute infarction, hemorrhage, hydrocephalus, extra-axial collection or mass lesion/mass effect. There is a peripheral area of encephalomalacia in the left parieto-occipital lobe, image 15/2. This is new since 02/11/2017. The appearance is compatible with either a subacute or chronic infarct. New low-attenuation in the left frontal lobe is also identified, image 19/2 compatible with subacute to chronic cortical infarct. There is mild diffuse low-attenuation within the subcortical and  periventricular white matter compatible with chronic microvascular disease. Prominence of the sulci and ventricles compatible with brain atrophy. Vascular: No hyperdense vessel or unexpected calcification. Skull: Normal. Negative for fracture or focal lesion. Sinuses/Orbits: The paranasal sinuses and mastoid air cells are clear. Other: None. IMPRESSION: 1. No acute findings. 2. There are 2 areas of low attenuation in the left frontal lobe and left parieto-occipital lobe compatible with subacute to chronic infarcts. These are new from 02/09/2017 3. Chronic small vessel ischemic disease and brain atrophy. Electronically Signed   By: Signa Kellaylor  Stroud M.D.   On: 10/01/2018 13:20   Mr Brain Wo Contrast  Result Date: 10/01/2018 CLINICAL DATA:  73 y/o F; altered mental status, progressive dementia, holding right arm against body for 1-2 months. EXAM: MRI HEAD WITHOUT CONTRAST MRA HEAD WITHOUT CONTRAST TECHNIQUE: Multiplanar, multiecho pulse sequences of the brain and surrounding structures were obtained without intravenous contrast. Angiographic images of the head were obtained using MRA technique without contrast. COMPARISON:  10/01/2018 CT head.  03/21/2009 MRI head. FINDINGS: MRI HEAD FINDINGS Brain: No acute infarction, hemorrhage, hydrocephalus, extra-axial collection or mass lesion. Small chronic cortical infarctions are present within the left middle frontal gyrus and the left parieto-occipital junction.  The pattern of chronic infarctions suggests a watershed distribution. Punctate and early confluent nonspecific T2 FLAIR hyperintensities in subcortical and periventricular white matter are compatible with mild to moderate chronic microvascular ischemic changes. Moderate volume loss of the brain. Vascular: As below. Skull and upper cervical spine: Normal marrow signal. Sinuses/Orbits: Negative. Other: Bilateral intra-ocular lens replacement. MRA HEAD FINDINGS Mild motion artifact at multiple levels. Internal carotid arteries:  Patent. Anterior cerebral arteries:  Patent. Middle cerebral arteries: Patent. Anterior communicating artery: Short right A1 and long anterior communicating artery, normal variant. Posterior communicating arteries:  Patent. Posterior cerebral arteries:  Patent. Basilar artery:  Patent. Vertebral arteries:  Patent. No evidence of high-grade stenosis, large vessel occlusion, or aneurysm identified. IMPRESSION: MRI head: 1. No acute intracranial abnormality. 2. 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 The pattern suggests a watershed distribution. 3. Mild-to-moderate chronic microvascular ischemic changes and moderate volume loss of the brain. MRA head: 1. Mild motion artifact at multiple levels. 2. Patent anterior and posterior intracranial circulation. No large vessel occlusion, aneurysm, or significant stenosis is identified. Electronically Signed   By: Mitzi HansenLance  Furusawa-Stratton M.D.   On: 10/01/2018 21:31   Mr Maxine GlennMra Head/brain ZOWo Cm  Result Date: 10/01/2018 CLINICAL DATA:  73 y/o F; altered mental status, progressive dementia, holding right arm against body for 1-2 months. EXAM: MRI HEAD WITHOUT CONTRAST MRA HEAD WITHOUT CONTRAST TECHNIQUE: Multiplanar, multiecho pulse sequences of the brain and surrounding structures were obtained without intravenous contrast. Angiographic images of the head were obtained using MRA technique  without contrast. COMPARISON:  10/01/2018 CT head.  03/21/2009 MRI head. FINDINGS: MRI HEAD FINDINGS Brain: No acute infarction, hemorrhage, hydrocephalus, extra-axial collection or mass lesion. Small chronic cortical infarctions are present within the left middle frontal gyrus and the left parieto-occipital junction. The pattern of chronic infarctions suggests a watershed distribution. Punctate and early confluent nonspecific T2 FLAIR hyperintensities in subcortical and periventricular white matter are compatible with mild to moderate chronic microvascular ischemic changes. Moderate volume loss of the brain. Vascular: As below. Skull and upper cervical spine: Normal marrow signal. Sinuses/Orbits: Negative. Other: Bilateral intra-ocular lens replacement. MRA HEAD FINDINGS Mild motion artifact at multiple levels. Internal carotid arteries:  Patent. Anterior cerebral arteries:  Patent. Middle  cerebral arteries: Patent. Anterior communicating artery: Short right A1 and long anterior communicating artery, normal variant. Posterior communicating arteries:  Patent. Posterior cerebral arteries:  Patent. Basilar artery:  Patent. Vertebral arteries:  Patent. No evidence of high-grade stenosis, large vessel occlusion, or aneurysm identified. IMPRESSION: MRI head: 1. No acute intracranial abnormality. 2. 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 The pattern suggests a watershed distribution. 3. Mild-to-moderate chronic microvascular ischemic changes and moderate volume loss of the brain. MRA head: 1. Mild motion artifact at multiple levels. 2. Patent anterior and posterior intracranial circulation. No large vessel occlusion, aneurysm, or significant stenosis is identified. Electronically Signed   By: Mitzi Hansen M.D.   On: 10/01/2018 21:31    Assessment: 73 y.o. female with a history of dementia presenting with worsening mental status and UTI.   Worsening of mental status likely multifactorial and related to progression of underlying dementia and concurrent infection.  MRI of the brain reviewed and shows evidence of chronic small vessel ischemic changes and atrophy but no acute changes.    Stroke Risk Factors - smoking  Plan: 1. Agree with treatment of infection 2. Would increase Remeron to 30mg  qhs   Thana Farr, MD Neurology [phone] 10/02/2018, 10:48 AM

## 2018-10-03 DIAGNOSIS — R410 Disorientation, unspecified: Secondary | ICD-10-CM

## 2018-10-03 LAB — URINE CULTURE: Culture: >=100000 — AB

## 2018-10-03 LAB — FOLATE: Folate: 10.4 ng/mL (ref >5.9)

## 2018-10-03 LAB — MAGNESIUM: Magnesium: 2 mg/dL (ref 1.7–2.4)

## 2018-10-03 LAB — PHOSPHORUS: Phosphorus: 3 mg/dL (ref 2.5–4.6)

## 2018-10-03 MED ORDER — QUETIAPINE FUMARATE 25 MG PO TABS
50.0000 mg | ORAL_TABLET | Freq: Every day | ORAL | Status: DC
  Administered 2018-10-03 – 2018-10-09 (×7): 50 mg via ORAL
  Filled 2018-10-03 (×7): qty 2

## 2018-10-03 MED ORDER — MIRTAZAPINE 15 MG PO TABS
15.0000 mg | ORAL_TABLET | Freq: Every day | ORAL | Status: DC
  Administered 2018-10-03 – 2018-10-09 (×7): 15 mg via ORAL
  Filled 2018-10-03 (×7): qty 1

## 2018-10-03 NOTE — Progress Notes (Addendum)
SLP Cancellation Note  Patient Details Name: Deborah [name] MRN: [id number] DOB: [birth year]   Cancelled treatment:       Reason Eval/Treat Not Completed: Patient's level of consciousness;Fatigue/lethargy limiting ability to participate;Patient not medically ready(chart reviewed; consulted NSG re: pt's status this morning). Have attempted to meet w/ pt 2x; she is sleepy/lethargic. Pt has significant Cognitive decline and agitated/agressive behavior currently. Pt has dxs including Dementia and Bipolar Dis. She is on a regular diet and eats using her hands mostly per NA. She is consuming foods and drinking thin liquids (as per ordered regular diet by MD) w/out overt s/s of aspiration noted per NSG report. NSG is monitoring and pt has a Comptroller in room at all times d/t behavior.  Recommend continue oral diet w/ min modifications for finger foods; cut foods for easier handling. Recommend aspiration precautions and monitoring of thin liquids. Recommend Pills given in Puree if needed, though NSG reported pt has been swallowing these adequately w/ liquid. W/ regard to a Cognitive evaluation, this would not be recommended in current light of such agitated/agressive behavior and overall declined Cognition. Possible f/u in future when pt is in a more structured setting and acute illness has recessed.   Pt would certainly require full supervision and assistance w/ ADLs for safety at D/C based on current presentation and baseline dxs of Dementia and Psychiatric issues. Recommend ongoing aspiration precautions. ST services will continue to be available for any further education or needs during this admission. NSG updated and agreed.      Jerilynn Som, MS, CCC-SLP , 10/03/2018, 11:24 AM

## 2018-10-03 NOTE — Progress Notes (Signed)
Subjective: Patient remains combative and easily agitated.    Objective: Current vital signs: BP (!) 124/59 (BP Location: Left Arm)   Pulse 64   Temp 97.7 F (36.5 C)   Resp 16   Ht 5\' 3"  (1.6 m)   Wt 46.6 kg   SpO2 98%   BMI 18.21 kg/m  Vital signs in last 24 hours: Temp:  [97.7 F (36.5 C)-99 F (37.2 C)] 97.7 F (36.5 C) (05/19 0409) Pulse Rate:  [64-78] 64 (05/19 0409) Resp:  [15-20] 16 (05/19 0409) BP: (124-147)/(51-80) 124/59 (05/19 0409) SpO2:  [97 %-98 %] 98 % (05/19 0409)  Intake/Output from previous day: 05/18 0701 - 05/19 0700 In: 2829.5 [P.O.:360; I.V.:2369.5; IV Piggyback:100] Out: 1200 [Urine:1200] Intake/Output this shift: Total I/O In: 20 [P.O.:20] Out: -  Nutritional status:  Diet Order            Diet Heart Room service appropriate? Yes with Assist; Fluid consistency: Thin  Diet effective now              Neurologic Exam: Mental Status: Alert.  Uncooperative and combative.  Yelling but fluent.  Does not cooperate to follow commands. Cranial Nerves: II: Blinks to bilateral confrontation.   III,IV, VI: ptosis not present, extra-ocular motions intact bilaterally V,VII: smile symmetric, facial light touch sensation normal bilaterally VIII: Unable to examine due to cooperation  IX,X: Unable to examine due to cooperation  XI: Unable to examine due to cooperation  XII: midline tongue extension Motor: Moves all extremities against gravity but does lift RUE less than LUE   Lab Results: Basic Metabolic Panel: Recent Labs  Lab 10/01/18 1216 10/02/18 0555  NA 138 140  K 4.0 3.5  CL 105 111  CO2 23 21*  GLUCOSE 120* 84  BUN 26* 32*  CREATININE 1.29* 1.34*  CALCIUM 8.9 8.0*    Liver Function Tests: Recent Labs  Lab 10/01/18 1216  AST 19  ALT 12  ALKPHOS 75  BILITOT 0.5  PROT 7.3  ALBUMIN 4.0   No results for input(s): LIPASE, AMYLASE in the last 168 hours. No results for input(s): AMMONIA in the last 168 hours.  CBC: Recent  Labs  Lab 10/01/18 1216 10/02/18 0555  WBC 10.3 6.8  HGB 13.0 11.3*  HCT 40.0 34.5*  MCV 99.3 97.7  PLT 192 158    Cardiac Enzymes: Recent Labs  Lab 10/01/18 1216  TROPONINI <0.03    Lipid Panel: Recent Labs  Lab 10/02/18 0555  CHOL 156  TRIG 96  HDL 35*  CHOLHDL 4.5  VLDL 19  LDLCALC 161102*    CBG: No results for input(s): GLUCAP in the last 168 hours.  Microbiology: Results for orders placed or performed during the hospital encounter of 10/01/18  SARS Coronavirus 2 (CEPHEID - Performed in [facility] lab), Hosp Order     Status: None   Collection Time: 10/01/18  1:29 PM  Result Value Ref Range Status   SARS Coronavirus 2 NEGATIVE NEGATIVE Final    Comment: (NOTE) If result is NEGATIVE SARS-CoV-2 target nucleic acids are NOT DETECTED. The SARS-CoV-2 RNA is generally detectable in upper and lower  respiratory specimens during the acute phase of infection. The lowest  concentration of SARS-CoV-2 viral copies this assay can detect is 250  copies / mL. A negative result does not preclude SARS-CoV-2 infection  and should not be used as the sole basis for treatment or other  patient management decisions.  A negative result may occur with  improper  specimen collection / handling, submission of specimen other  than nasopharyngeal swab, presence of viral mutation(s) within the  areas targeted by this assay, and inadequate number of viral copies  (<250 copies / mL). A negative result must be combined with clinical  observations, patient history, and epidemiological information. If result is POSITIVE SARS-CoV-2 target nucleic acids are DETECTED. The SARS-CoV-2 RNA is generally detectable in upper and lower  respiratory specimens dur ing the acute phase of infection.  Positive  results are indicative of active infection with SARS-CoV-2.  Clinical  correlation with patient history and other diagnostic information is  necessary to determine patient infection  status.  Positive results do  not rule out bacterial infection or co-infection with other viruses. If result is PRESUMPTIVE POSTIVE SARS-CoV-2 nucleic acids MAY BE PRESENT.   A presumptive positive result was obtained on the submitted specimen  and confirmed on repeat testing.  While 2019 novel coronavirus  (SARS-CoV-2) nucleic acids may be present in the submitted sample  additional confirmatory testing may be necessary for epidemiological  and / or clinical management purposes  to differentiate between  SARS-CoV-2 and other Sarbecovirus currently known to infect humans.  If clinically indicated additional testing with an alternate test  methodology [id number]) is advised. The SARS-CoV-2 RNA is generally  detectable in upper and lower respiratory sp ecimens during the acute  phase of infection. The expected result is Negative. Fact Sheet for Patients:  BoilerBrush.com.cy Fact Sheet for Healthcare Providers: https://pope.com/ This test is not yet approved or cleared by the [place] FDA and has been authorized for detection and/or diagnosis of SARS-CoV-2 by FDA under an Emergency Use Authorization (EUA).  This EUA will remain in effect (meaning this test can be used) for the duration of the COVID-19 declaration under Section 564(b)(1) of the Act, 21 U.S.C. section 360bbb-3(b)(1), unless the authorization is terminated or revoked sooner. Performed at [facility], [address], [place], [place] [address]   Urine Culture     Status: Abnormal (Preliminary result)   Collection Time: 10/01/18  5:02 PM  Result Value Ref Range Status   Specimen Description   Final    URINE, RANDOM Performed at [facility], [address], [place], [place] [address]    Special Requests   Final    NONE Performed at [facility], [address], [place], [place] [address]    Culture (A)  Final    >=100,000 COLONIES/mL  GRAM NEGATIVE RODS IDENTIFICATION AND SUSCEPTIBILITIES TO FOLLOW Performed at [facility], [address] [place], [place] [address]    Report Status PENDING  Incomplete    Coagulation Studies: No results for input(s): LABPROT, INR in the last 72 hours.  Imaging: Ct Head Wo Contrast  Result Date: 10/01/2018 CLINICAL DATA:  Altered level of consciousness. Progressive dementia. EXAM: CT HEAD WITHOUT CONTRAST TECHNIQUE: Contiguous axial images were obtained from the base of the skull through the vertex without intravenous contrast. COMPARISON:  02/11/2017 FINDINGS: Brain: No evidence of acute infarction, hemorrhage, hydrocephalus, extra-axial collection or mass lesion/mass effect. There is a peripheral area of encephalomalacia in the left parieto-occipital lobe, image 15/2. This is new since 02/11/2017. The appearance is compatible with either a subacute or chronic infarct. New low-attenuation in the left frontal lobe is also identified, image 19/2 compatible with subacute to chronic cortical infarct. There is mild diffuse low-attenuation within the subcortical and periventricular white matter compatible with chronic microvascular disease. Prominence of the sulci and ventricles compatible  with brain atrophy. Vascular: No hyperdense vessel or unexpected calcification. Skull: Normal. Negative for fracture or focal lesion. Sinuses/Orbits: The paranasal sinuses and mastoid air cells are clear. Other: None. IMPRESSION: 1. No acute findings. 2. There are 2 areas of low attenuation in the left frontal lobe and left parieto-occipital lobe compatible with subacute to chronic infarcts. These are new from 02/09/2017 3. Chronic small vessel ischemic disease and brain atrophy. Electronically Signed   By: Signa Kell M.D.   On: 10/01/2018 13:20   Mr Brain Wo Contrast  Result Date: 10/01/2018 CLINICAL DATA:  73 y/o F; altered mental status, progressive dementia, holding right arm against body for 1-2  months. EXAM: MRI HEAD WITHOUT CONTRAST MRA HEAD WITHOUT CONTRAST TECHNIQUE: Multiplanar, multiecho pulse sequences of the brain and surrounding structures were obtained without intravenous contrast. Angiographic images of the head were obtained using MRA technique without contrast. COMPARISON:  10/01/2018 CT head.  03/21/2009 MRI head. FINDINGS: MRI HEAD FINDINGS Brain: No acute infarction, hemorrhage, hydrocephalus, extra-axial collection or mass lesion. Small chronic cortical infarctions are present within the left middle frontal gyrus and the left parieto-occipital junction. The pattern of chronic infarctions suggests a watershed distribution. Punctate and early confluent nonspecific T2 FLAIR hyperintensities in subcortical and periventricular white matter are compatible with mild to moderate chronic microvascular ischemic changes. Moderate volume loss of the brain. Vascular: As below. Skull and upper cervical spine: Normal marrow signal. Sinuses/Orbits: Negative. Other: Bilateral intra-ocular lens replacement. MRA HEAD FINDINGS Mild motion artifact at multiple levels. Internal carotid arteries:  Patent. Anterior cerebral arteries:  Patent. Middle cerebral arteries: Patent. Anterior communicating artery: Short right A1 and long anterior communicating artery, normal variant. Posterior communicating arteries:  Patent. Posterior cerebral arteries:  Patent. Basilar artery:  Patent. Vertebral arteries:  Patent. No evidence of high-grade stenosis, large vessel occlusion, or aneurysm identified. IMPRESSION: MRI head: 1. No acute intracranial abnormality. 2. 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 The pattern suggests a watershed distribution. 3. Mild-to-moderate chronic microvascular ischemic changes and moderate volume loss of the brain. MRA head: 1. Mild motion artifact at multiple levels. 2. Patent anterior and posterior intracranial circulation. No  large vessel occlusion, aneurysm, or significant stenosis is identified. Electronically Signed   By: Mitzi Hansen M.D.   On: 10/01/2018 21:31   Mr Maxine Glenn Head/brain DT Cm  Result Date: 10/01/2018 CLINICAL DATA:  73 y/o F; altered mental status, progressive dementia, holding right arm against body for 1-2 months. EXAM: MRI HEAD WITHOUT CONTRAST MRA HEAD WITHOUT CONTRAST TECHNIQUE: Multiplanar, multiecho pulse sequences of the brain and surrounding structures were obtained without intravenous contrast. Angiographic images of the head were obtained using MRA technique without contrast. COMPARISON:  10/01/2018 CT head.  03/21/2009 MRI head. FINDINGS: MRI HEAD FINDINGS Brain: No acute infarction, hemorrhage, hydrocephalus, extra-axial collection or mass lesion. Small chronic cortical infarctions are present within the left middle frontal gyrus and the left parieto-occipital junction. The pattern of chronic infarctions suggests a watershed distribution. Punctate and early confluent nonspecific T2 FLAIR hyperintensities in subcortical and periventricular white matter are compatible with mild to moderate chronic microvascular ischemic changes. Moderate volume loss of the brain. Vascular: As below. Skull and upper cervical spine: Normal marrow signal. Sinuses/Orbits: Negative. Other: Bilateral intra-ocular lens replacement. MRA HEAD FINDINGS Mild motion artifact at multiple levels. Internal carotid arteries:  Patent. Anterior cerebral arteries:  Patent. Middle cerebral arteries: Patent. Anterior communicating artery: Short right A1 and long anterior communicating artery, normal  variant. Posterior communicating arteries:  Patent. Posterior cerebral arteries:  Patent. Basilar artery:  Patent. Vertebral arteries:  Patent. No evidence of high-grade stenosis, large vessel occlusion, or aneurysm identified. IMPRESSION: MRI head: 1. No acute intracranial abnormality. 2. Small chronic infarctions are present within the  left frontal lobe and the left parieto-occipital junction. The pattern suggests a watershed distribution. 3. Mild-to-moderate chronic microvascular ischemic changes and moderate volume loss of the brain. MRA head: 1. Mild motion artifact at multiple levels. 2. Patent anterior and posterior intracranial circulation. No large vessel occlusion, aneurysm, or significant stenosis is identified. Electronically Signed   By: Mitzi Hansen M.D.   On: 10/01/2018 21:31    Medications:  I have reviewed the patient's current medications. Scheduled: . aspirin EC  81 mg Oral Daily  . atorvastatin  40 mg Oral q1800  . cholecalciferol  2,000 Units Oral Daily  . donepezil  5 mg Oral QHS  . enoxaparin (LOVENOX) injection  30 mg Subcutaneous Q24H  . mirtazapine  30 mg Oral QHS    Assessment/Plan: Patient remains combative.  Received first increased dose of Remeron last evening.    Recommendations: 1. Will continue to follow with yo for need to further increase Remeron doses   LOS: 2 days   Thana Farr, MD Neurology [phone] 10/03/2018  11:44 AM

## 2018-10-03 NOTE — Progress Notes (Addendum)
[facility] [place] [facility]   PATIENT NAME: Deborah BurnetBetty [name]    MR#:  [id number]  DATE OF BIRTH:  [birth year]  SUBJECTIVE:  CHIEF COMPLAINT: Patient with intermittent episodes of agitation.  Asking everyone to " SHUT UP" sitter next to her REVIEW OF SYSTEMS:  Review of system unobtainable from baseline dementia  DRUG ALLERGIES:   Allergies  Allergen Reactions  . Prednisone Nausea And Vomiting, Nausea Only and Other (See Comments)  . Chlorphen-Pe-Acetaminophen Other (See Comments)    Reaction: unknown    VITALS:  Blood pressure (!) 124/59, pulse 64, temperature 97.7 F (36.5 C), resp. rate 16, height 5\' 3"  (1.6 m), weight 46.6 kg, SpO2 98 %.  PHYSICAL EXAMINATION:  GENERAL:  73 y.o.-year-old patient lying in the bed with no acute distress.  EYES: Pupils equal, round, reactive to light and accommodation. No scleral icterus. Extraocular muscles intact.  HEENT: Head atraumatic, normocephalic. Oropharynx and nasopharynx clear.  NECK:  Supple, no jugular venous distention. No thyroid enlargement, no tenderness.  LUNGS: Normal breath sounds bilaterally, no wheezing, rales,rhonchi or crepitation. No use of accessory muscles of respiration.  CARDIOVASCULAR: S1, S2 normal. No murmurs, rubs, or gallops.  ABDOMEN: Soft, nontender, nondistended. Bowel sounds present.  EXTREMITIES: No pedal edema, cyanosis, or clubbing.  NEUROLOGIC: Awake alert, altered sensation intact. Gait not checked.  PSYCHIATRIC: The patient is alert and disoriented.  Baseline dementia SKIN: No obvious rash, lesion, or ulcer.    LABORATORY PANEL:   CBC Recent Labs  Lab 10/02/18 0555  WBC 6.8  HGB 11.3*  HCT 34.5*  PLT 158   ------------------------------------------------------------------------------------------------------------------  Chemistries  Recent Labs  Lab 10/01/18 1216 10/02/18 0555  NA 138 140  K 4.0 3.5  CL 105 111  CO2 23 21*  GLUCOSE 120* 84  BUN 26*  32*  CREATININE 1.29* 1.34*  CALCIUM 8.9 8.0*  AST 19  --   ALT 12  --   ALKPHOS 75  --   BILITOT 0.5  --    ------------------------------------------------------------------------------------------------------------------  Cardiac Enzymes Recent Labs  Lab 10/01/18 1216  TROPONINI <0.03   ------------------------------------------------------------------------------------------------------------------  RADIOLOGY:  Mr Brain Wo Contrast  Result Date: 10/01/2018 CLINICAL DATA:  73 y/o F; altered mental status, progressive dementia, holding right arm against body for 1-2 months. EXAM: MRI HEAD WITHOUT CONTRAST MRA HEAD WITHOUT CONTRAST TECHNIQUE: Multiplanar, multiecho pulse sequences of the brain and surrounding structures were obtained without intravenous contrast. Angiographic images of the head were obtained using MRA technique without contrast. COMPARISON:  10/01/2018 CT head.  03/21/2009 MRI head. FINDINGS: MRI HEAD FINDINGS Brain: No acute infarction, hemorrhage, hydrocephalus, extra-axial collection or mass lesion. Small chronic cortical infarctions are present within the left middle frontal gyrus and the left parieto-occipital junction. The pattern of chronic infarctions suggests a watershed distribution. Punctate and early confluent nonspecific T2 FLAIR hyperintensities in subcortical and periventricular white matter are compatible with mild to moderate chronic microvascular ischemic changes. Moderate volume loss of the brain. Vascular: As below. Skull and upper cervical spine: Normal marrow signal. Sinuses/Orbits: Negative. Other: Bilateral intra-ocular lens replacement. MRA HEAD FINDINGS Mild motion artifact at multiple levels. Internal carotid arteries:  Patent. Anterior cerebral arteries:  Patent. Middle cerebral arteries: Patent. Anterior communicating artery: Short right A1 and long anterior communicating artery, normal variant. Posterior communicating arteries:  Patent. Posterior  cerebral arteries:  Patent. Basilar artery:  Patent. Vertebral arteries:  Patent. No evidence of high-grade stenosis, large vessel occlusion, or aneurysm identified. IMPRESSION: MRI head: 1. No  acute intracranial abnormality. 2. 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 The pattern suggests a watershed distribution. 3. Mild-to-moderate chronic microvascular ischemic changes and moderate volume loss of the brain. MRA head: 1. Mild motion artifact at multiple levels. 2. Patent anterior and posterior intracranial circulation. No large vessel occlusion, aneurysm, or significant stenosis is identified. Electronically Signed   By: Mitzi Hansen M.D.   On: 10/01/2018 21:31   Mr Maxine Glenn Head/brain NL Cm  Result Date: 10/01/2018 CLINICAL DATA:  73 y/o F; altered mental status, progressive dementia, holding right arm against body for 1-2 months. EXAM: MRI HEAD WITHOUT CONTRAST MRA HEAD WITHOUT CONTRAST TECHNIQUE: Multiplanar, multiecho pulse sequences of the brain and surrounding structures were obtained without intravenous contrast. Angiographic images of the head were obtained using MRA technique without contrast. COMPARISON:  10/01/2018 CT head.  03/21/2009 MRI head. FINDINGS: MRI HEAD FINDINGS Brain: No acute infarction, hemorrhage, hydrocephalus, extra-axial collection or mass lesion. Small chronic cortical infarctions are present within the left middle frontal gyrus and the left parieto-occipital junction. The pattern of chronic infarctions suggests a watershed distribution. Punctate and early confluent nonspecific T2 FLAIR hyperintensities in subcortical and periventricular white matter are compatible with mild to moderate chronic microvascular ischemic changes. Moderate volume loss of the brain. Vascular: As below. Skull and upper cervical spine: Normal marrow signal. Sinuses/Orbits: Negative. Other: Bilateral intra-ocular lens replacement.  MRA HEAD FINDINGS Mild motion artifact at multiple levels. Internal carotid arteries:  Patent. Anterior cerebral arteries:  Patent. Middle cerebral arteries: Patent. Anterior communicating artery: Short right A1 and long anterior communicating artery, normal variant. Posterior communicating arteries:  Patent. Posterior cerebral arteries:  Patent. Basilar artery:  Patent. Vertebral arteries:  Patent. No evidence of high-grade stenosis, large vessel occlusion, or aneurysm identified. IMPRESSION: MRI head: 1. No acute intracranial abnormality. 2. 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 The pattern suggests a watershed distribution. 3. Mild-to-moderate chronic microvascular ischemic changes and moderate volume loss of the brain. MRA head: 1. Mild motion artifact at multiple levels. 2. Patent anterior and posterior intracranial circulation. No large vessel occlusion, aneurysm, or significant stenosis is identified. Electronically Signed   By: Mitzi Hansen M.D.   On: 10/01/2018 21:31    EKG:   Orders placed or performed during the hospital encounter of 10/01/18  . ED EKG  . ED EKG  . EKG 12-Lead  . EKG 12-Lead    ASSESSMENT AND PLAN:   #Delirium with acute psychosis, underlying dementia from UTI Urine cultures with Proteus mirabilis pansensitive except Macrobid, continue ceftriaxone TSH normal  Psychiatry consult placed, notified Dr. Viviano Simas  #Subacute or chronic stroke-ruled out MRI, MRA of the brain negative Patient was not cooperating with echocardiogram Seen by neurology and recommending to increase Remeron to 30 mg nightly PT could not assess patient as patient is unable to follow commands at this time LDL 102   #Chronic history of dementia at the intermittent aggressive behavior Continue Aricept Remeron dose increased to 30 mg as recommended by neurology  #Chronic kidney disease stage III creatinine close to  baseline Avoid nephrotoxins and monitor renal function closely  #Disposition family is requesting skilled nursing facility PT could not evaluate her as she is agitated    All the records are reviewed and case discussed with Care Management/Social Workerr.  Tried to call son and daughter multiple times , unable to contact them   CODE STATUS: Partial code  TOTAL TIME TAKING CARE OF THIS PATIENT:36  minutes.   POSSIBLE D/C IN 2 DAYS, DEPENDING ON CLINICAL CONDITION.  Note: This dictation was prepared with Dragon dictation along with smaller phrase technology. Any transcriptional errors that result from this process are unintentional.   Ramonita Lab M.D on 10/03/2018 at 7:12 PM  Between 7am to 6pm - Pager - [phone] After 6pm go to [email or web address] - password [email or web address] [facility]  [facility] [facility]  Office  [phone]  CC: Primary care physician; Reubin Milan, MD
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
## 2018-10-03 NOTE — Progress Notes (Signed)
OT Cancellation Note  Patient Details Name: Deborah [name] MRN: [id number] DOB: Jun 09, 1945   Cancelled Treatment:    Reason Eval/Treat Not Completed: Other (comment). Pt's participation in therapy limited by agitation this am. Will re-attempt at later date/time as appropriate for OT evaluation.   Richrd Prime, MPH, MS, OTR/L ascom [phone] 10/03/18, 10:50 AM

## 2018-10-03 NOTE — Progress Notes (Addendum)
PT Cancellation Note  Patient Details Name: SHAIN PAPST MRN: [id number] DOB: [birth year]   Cancelled Treatment:    Reason Eval/Treat Not Completed: Other (comment)(PT entered room to assess pt this AM. Pt able to lift LUE and reach for PT hand very quickly. Unwilling/unable to move RUE or reposition in bed. Pt told PT to "shut up" and "get away from me". Able towiggleL toes. Further assessment held due to agitation)   Olga Coaster PT, DPT 9:46 AM,10/03/18 [phone]

## 2018-10-04 DIAGNOSIS — I639 Cerebral infarction, unspecified: Secondary | ICD-10-CM

## 2018-10-04 DIAGNOSIS — F319 Bipolar disorder, unspecified: Secondary | ICD-10-CM

## 2018-10-04 DIAGNOSIS — F419 Anxiety disorder, unspecified: Secondary | ICD-10-CM

## 2018-10-04 DIAGNOSIS — R410 Disorientation, unspecified: Secondary | ICD-10-CM

## 2018-10-04 MED ORDER — CEPHALEXIN 250 MG PO CAPS
250.0000 mg | ORAL_CAPSULE | Freq: Three times a day (TID) | ORAL | Status: DC
  Administered 2018-10-04 – 2018-10-07 (×8): 250 mg via ORAL
  Filled 2018-10-04 (×11): qty 1

## 2018-10-04 MED ORDER — ENSURE ENLIVE PO LIQD
237.0000 mL | Freq: Two times a day (BID) | ORAL | Status: DC
  Administered 2018-10-05 – 2018-10-08 (×7): 237 mL via ORAL

## 2018-10-04 NOTE — Progress Notes (Signed)
[facility] [place] at [facility]   PATIENT NAME: Deborah [name]    MR#:  [id number]  DATE OF BIRTH:  06/22/1945  SUBJECTIVE:  CHIEF COMPLAINT: Patient seems to be more cooperative today REVIEW OF SYSTEMS:  Review of system unobtainable from baseline dementia  DRUG ALLERGIES:   Allergies  Allergen Reactions  . Prednisone Nausea And Vomiting, Nausea Only and Other (See Comments)  . Chlorphen-Pe-Acetaminophen Other (See Comments)    Reaction: unknown    VITALS:  Blood pressure (!) 151/72, pulse 71, temperature 98.3 F (36.8 C), resp. rate 16, height 5\' 3"  (1.6 m), weight 46.6 kg, SpO2 96 %.  PHYSICAL EXAMINATION:  GENERAL:  73 y.o.-year-old patient lying in the bed with no acute distress.  EYES: Pupils equal, round, reactive to light and accommodation. No scleral icterus. Extraocular muscles intact.  HEENT: Head atraumatic, normocephalic. Oropharynx and nasopharynx clear.  NECK:  Supple, no jugular venous distention. No thyroid enlargement, no tenderness.  LUNGS: Normal breath sounds bilaterally, no wheezing, rales,rhonchi or crepitation. No use of accessory muscles of respiration.  CARDIOVASCULAR: S1, S2 normal. No murmurs, rubs, or gallops.  ABDOMEN: Soft, nontender, nondistended. Bowel sounds present.  EXTREMITIES: No pedal edema, cyanosis, or clubbing.  NEUROLOGIC: Awake alert, altered sensation intact. Gait not checked.  PSYCHIATRIC: The patient is alert and disoriented.  Baseline dementia SKIN: No obvious rash, lesion, or ulcer.    LABORATORY PANEL:   CBC Recent Labs  Lab 10/02/18 0555  WBC 6.8  HGB 11.3*  HCT 34.5*  PLT 158   ------------------------------------------------------------------------------------------------------------------  Chemistries  Recent Labs  Lab 10/01/18 1216 10/02/18 0555 10/03/18 1951  NA 138 140  --   K 4.0 3.5  --   CL 105 111  --   CO2 23 21*  --   GLUCOSE 120* 84  --   BUN 26* 32*  --    CREATININE 1.29* 1.34*  --   CALCIUM 8.9 8.0*  --   MG  --   --  2.0  AST 19  --   --   ALT 12  --   --   ALKPHOS 75  --   --   BILITOT 0.5  --   --    ------------------------------------------------------------------------------------------------------------------  Cardiac Enzymes Recent Labs  Lab 10/01/18 1216  TROPONINI <0.03   ------------------------------------------------------------------------------------------------------------------  RADIOLOGY:  No results found.  EKG:   Orders placed or performed during the hospital encounter of 10/01/18  . ED EKG  . ED EKG  . EKG 12-Lead  . EKG 12-Lead    ASSESSMENT AND PLAN:   #Delirium with acute psychosis, underlying dementia from UTI Urine cultures with Proteus mirabilis pansensitive except Macrobid, continue ceftriaxone TSH normal  Psychiatry consult , seen by Dr. Viviano Simas and Seroquel added to the regimen Recommending to continue Remeron at home dose and avoid benzodiazepines  #Subacute or chronic stroke-ruled out MRI, MRA of the brain negative Patient was not cooperating with echocardiogram Seen by neurology and treated the recommendations PT could not assess patient as patient is unable to follow commands at this time LDL 102   #Chronic history of dementia at the intermittent aggressive behavior Continue Aricept Remeron dose increased to 30 mg as recommended by neurology  #Chronic kidney disease stage III creatinine close to baseline Avoid nephrotoxins and monitor renal function closely  #Disposition family is requesting skilled nursing facility PT could not evaluate her as she is agitated    All the records are reviewed and case discussed with  Care Management/Social Workerr.  Call placed to med a.m. at [phone] for an update   CODE STATUS: Partial code  TOTAL TIME TAKING CARE OF THIS PATIENT:33  minutes.   POSSIBLE D/C IN1-  2 DAYS, DEPENDING ON CLINICAL CONDITION.  Note: This dictation was  prepared with Dragon dictation along with smaller phrase technology. Any transcriptional errors that result from this process are unintentional.   Ramonita LabAruna Shoua Ulloa M.D on 10/04/2018 at 2:31 PM  Between 7am to 6pm - Pager - [phone] After 6pm go to [email or web address] - password [email or web address] [email or web address]  [facility]  Office  [phone]  CC: Primary care physician; Reubin MilanBerglund, Laura H, MD

## 2018-10-04 NOTE — Progress Notes (Signed)
OT Cancellation Note  Patient Details Name: Deborah [name] MRN: [id number] DOB: April 02, 1946   Cancelled Treatment:    Reason Eval/Treat Not Completed: Patient declined, no reason specified;Other (comment). Pt continues to be agitated and unwilling/unable to participate with therapy. OT has attempted pt 3 days in a row and has been unable to complete OT evaluation due to agitation and pt unwilling/unable to follow commands. Pt inappropriate at this time. OT to sign off. Please reconsult OT if pt is able and appropriate to participate with occupational therapy.   Richrd Prime, MPH, MS, OTR/L ascom [phone] 10/04/18, 10:38 AM

## 2018-10-04 NOTE — Evaluation (Addendum)
Clinical/Bedside Swallow Evaluation Patient Details  Name: Deborah [name] MRN: [id number] Date of Birth: [birth year]  Today's Date: 10/04/2018 Time: SLP Start Time (ACUTE ONLY): 0800 SLP Stop Time (ACUTE ONLY): 0840 SLP Time Calculation (min) (ACUTE ONLY): 40 min  Past Medical History:  Past Medical History:  Diagnosis Date  . Anxiety   . Arthritis   . Asthma   . Bacteriuria 07/31/2016  . Bipolar 1 disorder (HCC)   . Bipolar 1 disorder, mixed, severe (HCC) 07/26/2016  . Cystitis   . Dementia (HCC)   . Depression   . Heart murmur   . IBS (irritable bowel syndrome)   . Osteoporosis   . Spastic colon   . Swallowing problem 11/09/2016  . Thrombocytopenia (HCC) 08/03/2016  . Ulcer    Past Surgical History:  Past Surgical History:  Procedure Laterality Date  . arm surgery Left 2011   fracture repair  . BREAST BIOPSY     [facility]  . CHOLECYSTECTOMY    . COLONOSCOPY  2002  . ELBOW SURGERY    . SHOULDER SURGERY    . SPINE SURGERY    . throat biopsy   2012  . UPPER GI ENDOSCOPY  2011   HPI:  Pt is a 73 y.o. female with a known history of Bipolar disorder, Dementia w/ aggressive behavior, anxiety, asthma, osteoporosis, chronic thrombocytopenia, IBS who was brought to the ED by her daughter for worsening combative behavior at home.  Daughter states that patient has been screaming and cussing at her family members.  This is been going on for the last several months, but has been getting progressively worse.  Pt is admitted w/ Delirium with acute psychosis, underlying Dementia; UTI treated. Psychiatry is following pt.  It has been difficult for Rehab to assess pt d/t pt's behavior. NSG reports pt eats w/ her hands, not using utensils but holds Cup independently to drink. She is verbal intemittently but does not follow commands.    Assessment / Plan / Recommendation Clinical Impression  Pt appears to present w/ grossly adequate oropharyngeal phase swallow function w/ fair management of oral  intake though she exhibits impulsive feeding behavior w/ reduced attention to po tasks. This can increase risk for choking/aspiration during oral intake if not monitored and/or following general aspiration precautions. Pt was verbally responsive briefly; min agitated in being positioned upright for po intake at breakfast meal. She appeared min agitated and unsettled; responded to wanting some juice and eggs then refused further until offered an Ensure - "vanilla" she stated. She did not follow instructions given mod+ cues. OME was not formally assessed d/t her lack of cooperation but appeared grossly [facility] w/ no unilateral weakness noted in lingual/labial movements during speech/po's. Pt consumed po trials of thin liquids via straw, puree and soft solids w/ no overt, clinical s/s of aspiration noted; clear vocal quality and no decline in respiratory status noted during/post trials. Oral phase was grossly [facility] for bolus management and clearing w/ all consistencies in single boluses but she presented w/ increased oral phase time attempting to manage larger boluses she impulsively fed herself x2. She fed herself w/ her hands; impulsivity and decreased awareness noted during self-feeding. Full monitoring given to reduce risk for aspiration. Suspect pt's baseline Cognitive and behavioral issues are impacting her self-feeding abilities and could increase risk for dysphagia and ability to meet nutritional needs fully.   Recommend a mech soft/regular diet for easier Cutting and self-feeding of cooked foods, and maybe more finger foods; Thin liquids.  Recommend general aspiration precautions; Pills in puree (crushed) for easier swallowing; tray setup and 100% Supervision and assistance at all meals by NSG staff d/t Cognitive status and behavioral issues as these can impact safety of oral intake. Recommend focusing on pt's po preferences to encourage desire for oral intake; Dietician f/u is recommended - pt seems to like drink  supplements. Further skilled ST Services can be requested if any decline in swallowing is noted during this admission. Recommend Palliative Care consult for GOC. This was discussed w/ NSG afterwards. SLP Visit Diagnosis: Dysphagia, unspecified (R13.10);Dysphagia, oral phase (R13.11)(d/t Cognitive decline)    Aspiration Risk  Mild aspiration risk;Risk for inadequate nutrition/hydration(but reduced w/ general aspiration precautions; monitoring )    Diet Recommendation  Mech soft/regular diet (more finger foods) - cooked, soft foods for easier eating; Thin liquids. General aspiration precautions. 100% Supervision during meals d/t behavioral and Cognitive decline issues  Medication Administration: Crushed with puree(as needed/able for safer, easier swallowing)    Other  Recommendations Recommended Consults: (Dietician f/u; Palliative Care consult for GOC) Oral Care Recommendations: Oral care BID;Staff/trained caregiver to provide oral care Other Recommendations: (n/a)   Follow up Recommendations (TBD by SW/MD/family)      Frequency and Duration (n/a)  (n/a)       Prognosis Prognosis for Safe Diet Advancement: Fair Barriers to Reach Goals: Cognitive deficits;Time post onset;Severity of deficits;Behavior      Swallow Study   General Date of Onset: 10/01/18 HPI: Pt is a 73 y.o. female with a known history of Bipolar disorder, Dementia w/ aggressive behavior, anxiety, asthma, osteoporosis, chronic thrombocytopenia, IBS who was brought to the ED by her daughter for worsening combative behavior at home.  Daughter states that patient has been screaming and cussing at her family members.  This is been going on for the last several months, but has been getting progressively worse.  Pt is admitted w/ Delirium with acute psychosis, underlying Dementia; UTI treated. Psychiatry is following pt.  It has been difficult for Rehab to assess pt d/t pt's behavior. NSG reports pt eats w/ her hands, not using  utensils but holds Cup independently to drink. She is verbal intemittently but does not follow commands.  Type of Study: Bedside Swallow Evaluation Previous Swallow Assessment: none reported Diet Prior to this Study: Regular;Thin liquids Temperature Spikes Noted: No(wbc 6.8) Respiratory Status: Room air History of Recent Intubation: No Behavior/Cognition: Confused;Agitated;Impulsive;Distractible;Requires cueing;Doesn't follow directions(Awake) Oral Cavity Assessment: (unable to assess d/t Cognitive status/cooperation) Oral Care Completed by SLP: Recent completion by staff Oral Cavity - Dentition: Adequate natural dentition(noted some dentition) Vision: Functional for self-feeding Self-Feeding Abilities: Able to feed self;Needs assist;Needs set up(w/ hands) Patient Positioning: Upright in bed(needed positioning) Baseline Vocal Quality: Low vocal intensity(quiet) Volitional Cough: Cognitively unable to elicit Volitional Swallow: Unable to elicit    Oral/Motor/Sensory Function Overall Oral Motor/Sensory Function: (difficult to assess d/t Cognitive status/cooperation; fair)   Ice Chips Ice chips: Not tested   Thin Liquid Thin Liquid: Within functional limits Presentation: Self Fed;Straw(~3-4 ozs total) Other Comments: juice and Ensure    Nectar Thick Nectar Thick Liquid: Not tested   Honey Thick Honey Thick Liquid: Not tested   Puree Puree: Within functional limits Presentation: Spoon(fed; 2 trials)   Solid     Solid: Impaired Presentation: Self Fed(w/ hands) Oral Phase Impairments: (reduced awareness during task, of bolus) Oral Phase Functional Implications: Prolonged oral transit(bolus management w/ large boluses) Pharyngeal Phase Impairments: (none) Other Comments: pt often pt too large a bite in mouth  Jerilynn Som, MS, CCC-SLP Renesha Lizama 10/04/2018,4:40 PM
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
## 2018-10-04 NOTE — Progress Notes (Signed)
Subjective: Patient more calm today but remains uncooperative.    Objective: Current vital signs: BP (!) 151/72 (BP Location: Right Arm)   Pulse 71   Temp 98.3 F (36.8 C)   Resp 16   Ht 5\' 3"  (1.6 m)   Wt 46.6 kg   SpO2 96%   BMI 18.21 kg/m  Vital signs in last 24 hours: Temp:  [97.6 F (36.4 C)-98.3 F (36.8 C)] 98.3 F (36.8 C) (05/20 0510) Pulse Rate:  [71-89] 71 (05/20 0510) Resp:  [16] 16 (05/20 0510) BP: (144-173)/(72-94) 151/72 (05/20 0510) SpO2:  [93 %-96 %] 96 % (05/20 0510)  Intake/Output from previous day: 05/19 0701 - 05/20 0700 In: 1099.5 [P.O.:20; I.V.:1079.5] Out: 600 [Urine:600] Intake/Output this shift: Total I/O In: 120 [P.O.:120] Out: -  Nutritional status:  Diet Order            Diet Heart Room service appropriate? Yes with Assist; Fluid consistency: Thin  Diet effective now              Neurologic Exam: Mental Status: Alert. Calm but does not want to be touched. Fluent. Does not cooperate to follow commands. Cranial Nerves: OP:[id number] to bilateral confrontation. III,IV, VI: ptosis not present, extra-ocular motions intact bilaterally V,VII: smile symmetric, facial light touch sensation normal bilaterally VIII:Unable to examine due to cooperation IX,X:Unable to examine due to cooperation QK:[id number] to examine due to cooperation XII: midline tongue extension Motor: Moves all extremities against gravity but does lift RUE less than LUE   Lab Results: Basic Metabolic Panel: Recent Labs  Lab 10/01/18 1216 10/02/18 0555 10/03/18 1951  NA 138 140  --   K 4.0 3.5  --   CL 105 111  --   CO2 23 21*  --   GLUCOSE 120* 84  --   BUN 26* 32*  --   CREATININE 1.29* 1.34*  --   CALCIUM 8.9 8.0*  --   MG  --   --  2.0  PHOS  --   --  3.0    Liver Function Tests: Recent Labs  Lab 10/01/18 1216  AST 19  ALT 12  ALKPHOS 75  BILITOT 0.5  PROT 7.3  ALBUMIN 4.0   No results for input(s): LIPASE, AMYLASE in the last 168  hours. No results for input(s): AMMONIA in the last 168 hours.  CBC: Recent Labs  Lab 10/01/18 1216 10/02/18 0555  WBC 10.3 6.8  HGB 13.0 11.3*  HCT 40.0 34.5*  MCV 99.3 97.7  PLT 192 158    Cardiac Enzymes: Recent Labs  Lab 10/01/18 1216  TROPONINI <0.03    Lipid Panel: Recent Labs  Lab 10/02/18 0555  CHOL 156  TRIG 96  HDL 35*  CHOLHDL 4.5  VLDL 19  LDLCALC 711*    CBG: No results for input(s): GLUCAP in the last 168 hours.  Microbiology: Results for orders placed or performed during the hospital encounter of 10/01/18  SARS Coronavirus 2 (CEPHEID - Performed in [facility] hospital lab), Hosp Order     Status: None   Collection Time: 10/01/18  1:29 PM  Result Value Ref Range Status   SARS Coronavirus 2 NEGATIVE NEGATIVE Final    Comment: (NOTE) If result is NEGATIVE SARS-CoV-2 target nucleic acids are NOT DETECTED. The SARS-CoV-2 RNA is generally detectable in upper and lower  respiratory specimens during the acute phase of infection. The lowest  concentration of SARS-CoV-2 viral copies this assay can detect is 250  copies / mL. A  negative result does not preclude SARS-CoV-2 infection  and should not be used as the sole basis for treatment or other  patient management decisions.  A negative result may occur with  improper specimen collection / handling, submission of specimen other  than nasopharyngeal swab, presence of viral mutation(s) within the  areas targeted by this assay, and inadequate number of viral copies  (<250 copies / mL). A negative result must be combined with clinical  observations, patient history, and epidemiological information. If result is POSITIVE SARS-CoV-2 target nucleic acids are DETECTED. The SARS-CoV-2 RNA is generally detectable in upper and lower  respiratory specimens dur ing the acute phase of infection.  Positive  results are indicative of active infection with SARS-CoV-2.  Clinical  correlation with patient history and  other diagnostic information is  necessary to determine patient infection status.  Positive results do  not rule out bacterial infection or co-infection with other viruses. If result is PRESUMPTIVE POSTIVE SARS-CoV-2 nucleic acids MAY BE PRESENT.   A presumptive positive result was obtained on the submitted specimen  and confirmed on repeat testing.  While 2019 novel coronavirus  (SARS-CoV-2) nucleic acids may be present in the submitted sample  additional confirmatory testing may be necessary for epidemiological  and / or clinical management purposes  to differentiate between  SARS-CoV-2 and other Sarbecovirus currently known to infect humans.  If clinically indicated additional testing with an alternate test  methodology [phone]) is advised. The SARS-CoV-2 RNA is generally  detectable in upper and lower respiratory sp ecimens during the acute  phase of infection. The expected result is Negative. Fact Sheet for Patients:  BoilerBrush.com.cy Fact Sheet for Healthcare Providers: https://pope.com/ This test is not yet approved or cleared by the [place] FDA and has been authorized for detection and/or diagnosis of SARS-CoV-2 by FDA under an Emergency Use Authorization (EUA).  This EUA will remain in effect (meaning this test can be used) for the duration of the COVID-19 declaration under Section 564(b)(1) of the Act, 21 U.S.C. section 360bbb-3(b)(1), unless the authorization is terminated or revoked sooner. Performed at [facility], [address], [place], [place] [address]   Urine Culture     Status: Abnormal   Collection Time: 10/01/18  5:02 PM  Result Value Ref Range Status   Specimen Description   Final    URINE, RANDOM Performed at [facility], [address] [place], [place] [address]    Special Requests   Final    NONE Performed at [facility], [address] [place], [place]  [address]    Culture >=100,000 COLONIES/mL PROTEUS MIRABILIS (A)  Final   Report Status 10/03/2018 FINAL  Final   Organism ID, Bacteria PROTEUS MIRABILIS (A)  Final      Susceptibility   Proteus mirabilis - MIC*    AMPICILLIN <=2 SENSITIVE Sensitive     CEFAZOLIN <=4 SENSITIVE Sensitive     CEFTRIAXONE <=1 SENSITIVE Sensitive     CIPROFLOXACIN <=0.25 SENSITIVE Sensitive     GENTAMICIN <=1 SENSITIVE Sensitive     IMIPENEM 2 SENSITIVE Sensitive     NITROFURANTOIN 128 RESISTANT Resistant     TRIMETH/SULFA <=20 SENSITIVE Sensitive     AMPICILLIN/SULBACTAM <=2 SENSITIVE Sensitive     PIP/TAZO <=4 SENSITIVE Sensitive     * >=100,000 COLONIES/mL PROTEUS MIRABILIS    Coagulation Studies: No results for input(s): LABPROT, INR in the last 72 hours.  Imaging: No results found.  Medications:  I have reviewed the patient's  current medications. Scheduled: . aspirin EC  81 mg Oral Daily  . atorvastatin  40 mg Oral q1800  . cholecalciferol  2,000 Units Oral Daily  . donepezil  5 mg Oral QHS  . enoxaparin (LOVENOX) injection  30 mg Subcutaneous Q24H  . mirtazapine  15 mg Oral QHS  . QUEtiapine  50 mg Oral QHS    Assessment/Plan: Patient remains combative.  Received first increased dose of Remeron last evening.    Recommendations: 1. Will continue to follow with yo for need to further increase Remeron doses   LOS: 3 days   Thana FarrLeslie Corbett Moulder, MD Neurology [phone] 10/04/2018  2:52 PM

## 2018-10-04 NOTE — Progress Notes (Signed)
PT Cancellation Note  Patient Details Name: Deborah [name] MRN: [id number] DOB: Jun 25, 1945   Cancelled Treatment:    Reason Eval/Treat Not Completed: Other (comment)(Pt replied "hey" to PT when entering the room. Agreeable to move LUE and flucuated between asking to sit up and lay down. When PT pulled pt blankets back, pt became very agitated and cussed at PT. ) PT has attempted pt 3 days in a row, ver limited by pt agitation and unable/unwilling to follow commands. PT to sign off. Please reconsult PT if pt is able and appropriate to participate with physical therapy.    Olga Coaster PT, DPT 10:21 AM,10/04/18 [phone]

## 2018-10-04 NOTE — Care Management Important Message (Signed)
Important Message  Patient Details  Name: Deborah [name] MRN: [id number] Date of Birth: [birth year]   Medicare Important Message Given:  Yes    [name] A Lior Hoen 10/04/2018, 10:59 AM

## 2018-10-04 NOTE — Progress Notes (Signed)
Initial Nutrition Assessment  RD working remotely.  DOCUMENTATION CODES:   Underweight  INTERVENTION:  Recommend liberalizing to regular diet. Continue recommendations from SLP.  Provide Ensure Enlive po BID, each supplement provides 350 kcal and 20 grams of protein.  NUTRITION DIAGNOSIS:   Inadequate oral intake related to decreased appetite as evidenced by meal completion < 50%.  GOAL:   Patient will meet greater than or equal to 90% of their needs  MONITOR:   PO intake, Supplement acceptance, Labs, Weight trends, Skin, I & O's  REASON FOR ASSESSMENT:   Other (Comment)(Low BMI)    ASSESSMENT:   73 year old female with PMHx of dementia, asthma, cystitis, arthritis, depression, IBS, OP, anxiety, CKD stage III, bipolar 1 disorder admitted with delirium with acute psychosis.   Patient unable to provide history at this time. Per chart she is eating 20-30% of most meals. Abdomen is soft per RN documentation. Patient had a large type 7 BM yesterday. Per chart patient was 50-51 kg in 2017. She is currently 46.6 kg (102.8 lbs). Patient is underweight. Suspect she may be malnourished, but unable to tell without more of a nutrition/weight history and completing an NFPE.  Medications reviewed and include: vitamin D 2000 units daily, Remeron 15 mg QHS, Seroquel 50 mg QHS, NS @ 75 mL/hr, ceftriaxone.  Labs reviewed: CO2 21, BUN 32, Creatinine 1.34.  NUTRITION - FOCUSED PHYSICAL EXAM:  Unable to complete at this time.  Diet Order:   Diet Order            Diet Heart Room service appropriate? Yes with Assist; Fluid consistency: Thin  Diet effective now             EDUCATION NEEDS:   Not appropriate for education at this time  Skin:  Skin Assessment: Reviewed RN Assessment  Last BM:  10/03/2018 - large type 7  Height:   Ht Readings from Last 1 Encounters:  10/01/18 5\' 3"  (1.6 m)   Weight:   Wt Readings from Last 1 Encounters:  10/01/18 46.6 kg   Ideal Body Weight:   52.3 kg  BMI:  Body mass index is 18.21 kg/m.  Estimated Nutritional Needs:   Kcal:  1200-1400  Protein:  60-70 grams  Fluid:  1.2-1.4 L/day  Helane Rima, MS, RD, LDN Office: [phone] Pager: [phone] After Hours/Weekend Pager: [phone]

## 2018-10-05 LAB — BASIC METABOLIC PANEL
Anion gap: 7 (ref 5–15)
BUN: 18 mg/dL (ref 8–23)
CO2: 22 mmol/L (ref 22–32)
Calcium: 8.4 mg/dL — ABNORMAL LOW (ref 8.9–10.3)
Chloride: 110 mmol/L (ref 98–111)
Creatinine, Ser: 0.98 mg/dL (ref 0.44–1.00)
GFR calc Af Amer: >60 mL/min (ref >60)
GFR calc non Af Amer: 58 mL/min — ABNORMAL LOW (ref >60)
Glucose, Bld: 106 mg/dL — ABNORMAL HIGH (ref 70–99)
Potassium: 3.5 mmol/L (ref 3.5–5.1)
Sodium: 139 mmol/L (ref 135–145)

## 2018-10-05 MED ORDER — QUETIAPINE FUMARATE 25 MG PO TABS
12.5000 mg | ORAL_TABLET | Freq: Two times a day (BID) | ORAL | Status: DC
  Administered 2018-10-05 – 2018-10-10 (×10): 12.5 mg via ORAL
  Filled 2018-10-05 (×10): qty 1

## 2018-10-05 MED ORDER — ENOXAPARIN SODIUM 40 MG/0.4ML ~~LOC~~ SOLN
40.0000 mg | SUBCUTANEOUS | Status: DC
  Administered 2018-10-05: 40 mg via SUBCUTANEOUS
  Filled 2018-10-05 (×4): qty 0.4
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
## 2018-10-05 NOTE — Plan of Care (Signed)
  Problem: Education: Goal: Knowledge of disease or condition will improve Outcome: Progressing Goal: Knowledge of secondary prevention will improve Outcome: Progressing Goal: Knowledge of patient specific risk factors addressed and post discharge goals established will improve Outcome: Progressing   Problem: Coping: Goal: Will verbalize positive feelings about self Outcome: Progressing Goal: Will identify appropriate support needs Outcome: Progressing   Problem: Health Behavior/Discharge Planning: Goal: Ability to manage health-related needs will improve Outcome: Progressing   Problem: Self-Care: Goal: Ability to participate in self-care as condition permits will improve Outcome: Progressing Goal: Verbalization of feelings and concerns over difficulty with self-care will improve Outcome: Progressing Goal: Ability to communicate needs accurately will improve Outcome: Progressing   Problem: Ischemic Stroke/TIA Tissue Perfusion: Goal: Complications of ischemic stroke/TIA will be minimized Outcome: Progressing   Problem: Education: Goal: Knowledge of General Education information will improve Description Including pain rating scale, medication(s)/side effects and non-pharmacologic comfort measures Outcome: Progressing   Problem: Health Behavior/Discharge Planning: Goal: Ability to manage health-related needs will improve Outcome: Progressing   Problem: Clinical Measurements: Goal: Ability to maintain clinical measurements within normal limits will improve Outcome: Progressing Goal: Will remain free from infection Outcome: Progressing Goal: Diagnostic test results will improve Outcome: Progressing   Problem: Activity: Goal: Risk for activity intolerance will decrease Outcome: Progressing   Problem: Coping: Goal: Level of anxiety will decrease Outcome: Progressing   Problem: Pain Managment: Goal: General experience of comfort will improve Outcome: Progressing    Problem: Safety: Goal: Ability to remain free from injury will improve Outcome: Progressing   Problem: Skin Integrity: Goal: Risk for impaired skin integrity will decrease Outcome: Progressing

## 2018-10-05 NOTE — Progress Notes (Signed)
[facility] [place] at [facility]   PATIENT NAME: Deborah BurnetBetty [name]    MR#:  [id number]  DATE OF BIRTH:  February 15, 1946  SUBJECTIVE:  CHIEF COMPLAINT: Patient is not cooperative today, asked me to shut up and leave the room again.  Using foul language to the staff and threatening to punch them REVIEW OF SYSTEMS:  Review of system unobtainable from baseline dementia  DRUG ALLERGIES:   Allergies  Allergen Reactions  . Prednisone Nausea And Vomiting, Nausea Only and Other (See Comments)  . Chlorphen-Pe-Acetaminophen Other (See Comments)    Reaction: unknown    VITALS:  Blood pressure (!) 142/57, pulse 64, temperature 98.4 F (36.9 C), temperature source Oral, resp. rate 16, height 5\' 3"  (1.6 m), weight 46.6 kg, SpO2 97 %.  PHYSICAL EXAMINATION:  Limited physical exam as patient is not cooperative  GENERAL:  73 y.o.-year-old patient lying in the bed with no acute distress.  EYES: Pupils equal, round, reactive to light and accommodation. No scleral icterus.  NECK:  Supple, no jugular venous distention. LUNGS:.  Normal respiratory effort no use of accessory muscles of respiration.  CARDIOVASCULAR: S1, S2 normal. No murmurs, rubs, or gallops.  NEUROLOGIC: Awake alert, altered sensation intact. Gait not checked.  PSYCHIATRIC: The patient is alert and disoriented with intermittent episodes of agitation baseline dementia SKIN: No obvious rash, lesion, or ulcer.    LABORATORY PANEL:   CBC Recent Labs  [name] 10/02/18 0555  WBC 6.8  HGB 11.3*  HCT 34.5*  PLT 158   ------------------------------------------------------------------------------------------------------------------  Chemistries  Recent Labs  [name] 10/01/18 1216 10/02/18 0555 10/03/18 1951  NA 138 140  --   K 4.0 3.5  --   CL 105 111  --   CO2 23 21*  --   GLUCOSE 120* 84  --   BUN 26* 32*  --   CREATININE 1.29* 1.34*  --   CALCIUM 8.9 8.0*  --   MG  --   --  2.0  AST 19  --   --   ALT  12  --   --   ALKPHOS 75  --   --   BILITOT 0.5  --   --    ------------------------------------------------------------------------------------------------------------------  Cardiac Enzymes Recent Labs  [name] 10/01/18 1216  TROPONINI <0.03   ------------------------------------------------------------------------------------------------------------------  RADIOLOGY:  No results found.  EKG:   Orders placed or performed during the hospital encounter of 10/01/18  . ED EKG  . ED EKG  . EKG 12-Lead  . EKG 12-Lead    ASSESSMENT AND PLAN:   #Delirium with acute psychosis, underlying dementia from UTI Urine cultures with Proteus mirabilis pansensitive except Macrobid, continue ceftriaxone TSH normal  Psychiatry consult , seen by Dr. Viviano SimasMaurer and Seroquel added to the regimen Recommending to continue Remeron at home dose and avoid benzodiazepines Will talk to psychiatrist if patient is appropriate to inpatient psych floor as there are no medical issues to manage at this time  #Subacute or chronic stroke-ruled out MRI, MRA of the brain negative Patient was not cooperating with echocardiogram Seen by neurology and treated the recommendations PT could not assess patient as patient is unable to follow commands at this time LDL 102   #Chronic history of dementia at the intermittent aggressive behavior Continue Aricept Remeron dose increased to 30 mg as recommended by neurology  #Chronic kidney disease stage III creatinine close to baseline Avoid nephrotoxins and monitor renal function closely  #Disposition family is requesting skilled nursing facility PT  could not evaluate her as she is agitated    All the records are reviewed and case discussed with Care Management/Social Workerr.  Call placed and discussed with Chales Abrahams her daughter and given update on 10/04/2018.  She will be in her dad's funeral today as he just passed away  CODE STATUS: Partial code  TOTAL TIME TAKING  CARE OF THIS PATIENT: 28  minutes.   POSSIBLE D/C IN 2  DAYS, DEPENDING ON CLINICAL CONDITION.  Note: This dictation was prepared with Dragon dictation along with smaller phrase technology. Any transcriptional errors that result from this process are unintentional.   Deborah [name] M.D on 10/05/2018 at 12:07 PM  Between 7am to 6pm - Pager - [phone] After 6pm go to [email or web address] - password [email or web address] [facility]  [facility]  Office  [phone]  CC: Primary care physician; Reubin Milan, MD

## 2018-10-05 NOTE — Progress Notes (Signed)
PHARMACIST - PHYSICIAN COMMUNICATION  CONCERNING:  Enoxaparin (Lovenox) for DVT Prophylaxis    RECOMMENDATION: Patient was prescribed enoxaprin 30mg  q24 hours for VTE prophylaxis.   Filed Weights   10/01/18 1746  Weight: 102 lb 12.8 oz (46.6 kg)    Body mass index is 18.21 kg/m.  Estimated Creatinine Clearance: 38.7 mL/min (by C-G formula based on SCr of 0.98 mg/dL).   Patient is a candidate for enoxaparin 40mg  every 24 hours based on improved renal function (CrCl >41ml/min).  DESCRIPTION: Pharmacy has adjusted enoxaparin dose per [facility] policy.  Patient is now receiving enoxaparin 40mg  every 24 hours.  Katha Cabal, PharmD Clinical Pharmacist  10/05/2018 3:24 PM

## 2018-10-06 DIAGNOSIS — Z515 Encounter for palliative care: Secondary | ICD-10-CM

## 2018-10-06 DIAGNOSIS — N390 Urinary tract infection, site not specified: Principal | ICD-10-CM

## 2018-10-06 DIAGNOSIS — Z7189 Other specified counseling: Secondary | ICD-10-CM

## 2018-10-06 LAB — METHYLMALONIC ACID, SERUM: Methylmalonic Acid, Quantitative: 93 nmol/L (ref 0–378)

## 2018-10-06 MED ORDER — OLANZAPINE 2.5 MG PO TABS
2.5000 mg | ORAL_TABLET | Freq: Every day | ORAL | Status: DC | PRN
  Filled 2018-10-06: qty 1

## 2018-10-06 MED ORDER — OLANZAPINE 10 MG IM SOLR
2.5000 mg | Freq: Every day | INTRAMUSCULAR | Status: DC | PRN
  Filled 2018-10-06: qty 10

## 2018-10-06 MED ORDER — DIVALPROEX SODIUM 125 MG PO CSDR
125.0000 mg | DELAYED_RELEASE_CAPSULE | Freq: Two times a day (BID) | ORAL | Status: DC
  Administered 2018-10-06 – 2018-10-10 (×8): 125 mg via ORAL
  Filled 2018-10-06 (×10): qty 1

## 2018-10-06 NOTE — Care Management Important Message (Signed)
Important Message  Patient Details  Name: Deborah [name] MRN: [id number] Date of Birth: 04/21/1946   Medicare Important Message Given:  Yes    [name] [name] 10/06/2018, 10:56 AM

## 2018-10-06 NOTE — Plan of Care (Signed)
  Problem: Coping: Goal: Will verbalize positive feelings about self Outcome: Progressing   Problem: Self-Care: Goal: Ability to communicate needs accurately will improve Outcome: Progressing   Problem: Ischemic Stroke/TIA Tissue Perfusion: Goal: Complications of ischemic stroke/TIA will be minimized Outcome: Progressing   Problem: Clinical Measurements: Goal: Ability to maintain clinical measurements within normal limits will improve Outcome: Progressing Goal: Will remain free from infection Outcome: Progressing Goal: Diagnostic test results will improve Outcome: Progressing   Problem: Activity: Goal: Risk for activity intolerance will decrease Outcome: Progressing   Problem: Coping: Goal: Level of anxiety will decrease Outcome: Progressing   Problem: Pain Managment: Goal: General experience of comfort will improve Outcome: Progressing   Problem: Safety: Goal: Ability to remain free from injury will improve Outcome: Progressing   Problem: Skin Integrity: Goal: Risk for impaired skin integrity will decrease Outcome: Progressing

## 2018-10-06 NOTE — Consult Note (Signed)
Consultation Note Date: 10/06/2018   Patient Name: Deborah [name]  DOB: 09/29/1945  MRN: [id number]  Age / Sex: 73 y.o., female  PCP: Reubin MilanBerglund, Laura H, MD Referring Physician: Enedina FinnerPatel, Sona, MD  Reason for Consultation: Establishing goals of care  HPI/Patient Profile: 73 y.o. female  with past medical history of bipolar disorder, dementia, anxiety, asthma, osteoporosis, thrombocytopenia, and IBS admitted on 10/01/2018 with worsening combative behavior. Treated for UTI. Psych involved for delirium and aggressive behaviors. Stroke has been ruled out. Family requesting SNF placement; but PT has been unable to work with patient d/t combative behaviors. PMT consulted for GOC.  Clinical Assessment and Goals of Care: I have reviewed medical records including EPIC notes, labs and imaging, received report from Dr. Allena KatzPatel and RN, and then spoke with patient's daughter, Deborah [name],  to discuss diagnosis prognosis, GOC, EOL wishes, disposition and options.  I introduced Palliative Medicine as specialized medical care for people living with serious illness. It focuses on providing relief from the symptoms and stress of a serious illness. The goal is to improve quality of life for both the patient and the family.  Deborah [name] shares with me the details of their family's current situation - patient lives her husband. He is currently hospitalized and was just diagnosed with metastatic cancer. Her ex husband (Maryann's father) just passed away with hospice. Patient's other daughter lives with her but is cognitively impaired.  Deborah [name] shares that she is the patient's legal guardian and has the paperwork for this.   As far as functional and nutritional status, she tells me that the patient was dependent in all ADLs - she could walk but needed assistance d/t weakness. Ate small amounts. Deborah [name] shares her opinion that the patient has "slept for the past 25 years". She tells me  about her mental illness history and shares that the patient has not been active due to her illness and has become frail.    We discussed her current illness and what it means in the larger context of her on-going co-morbidities.  Natural disease trajectory and expectations at EOL were discussed. She understands we are treated her UTI and psych is working with her delirium.   Deborah [name] shares that comfort is her ultimate goal for her mother and that she would not want to artificially prolong life. We discussed that patient's code status was partial - indicating she would want CPR. Deborah [name] asks that I change this to full DNR.   Patient's life limiting illness of dementia does not qualify her for hospice services at this time as she is still verbal, ambulatory, and eating.   Questions and concerns were addressed. The family was encouraged to call with questions or concerns.    Primary Decision Maker LEGAL GUARDIAN - daughter Deborah [name]    SUMMARY OF RECOMMENDATIONS   - code status changed to DNR - daughter hopeful for SNF placement - will defer symptom management/delirium to psych/neuro/primary - will shadow chart, goals are clear, no further role for PMT at this time  Code Status/Advance Care Planning:  DNR  Prognosis:   Unable to determine  Discharge Planning: Skilled Nursing Facility for rehab with Palliative care service follow-up      Primary Diagnoses: Present on Admission: . Stroke (HCC) . Anxiety . Bipolar 1 disorder, depressed (HCC)   I have reviewed the medical record, interviewed the patient and family, and examined the patient. The following aspects are pertinent.  Past Medical History:  Diagnosis Date  . Anxiety   . Arthritis   .  Asthma   . Bacteriuria 07/31/2016  . Bipolar 1 disorder (HCC)   . Bipolar 1 disorder, mixed, severe (HCC) 07/26/2016  . Cystitis   . Dementia (HCC)   . Depression   . Heart murmur   . IBS (irritable bowel syndrome)   . Osteoporosis    . Spastic colon   . Swallowing problem 11/09/2016  . Thrombocytopenia (HCC) 08/03/2016  . Ulcer    Social History   Socioeconomic History  . Marital status: Married    Spouse name: Not on file  . Number of children: Not on file  . Years of education: Not on file  . Highest education level: Not on file  Occupational History  . Not on file  Social Needs  . Financial resource strain: Not on file  . Food insecurity:    Worry: Not on file    Inability: Not on file  . Transportation needs:    Medical: Not on file    Non-medical: Not on file  Tobacco Use  . Smoking status: Current Every Day Smoker    Packs/day: 1.00    Years: 20.00    Pack years: 20.00    Types: Cigarettes    Start date: 03/31/1977  . Smokeless tobacco: Never Used  Substance and Sexual Activity  . Alcohol use: No    Alcohol/week: 0.0 standard drinks    Comment: Ocassional  . Drug use: No  . Sexual activity: Not Currently  Lifestyle  . Physical activity:    Days per week: Not on file    Minutes per session: Not on file  . Stress: Not on file  Relationships  . Social connections:    Talks on phone: Not on file    Gets together: Not on file    Attends religious service: Not on file    Active member of club or organization: Not on file    Attends meetings of clubs or organizations: Not on file    Relationship status: Not on file  Other Topics Concern  . Not on file  Social History Narrative  . Not on file   Family History  Problem Relation Age of Onset  . Heart attack Mother   . Cancer Sister        ovarian  . Cancer Maternal Aunt        breast  . Cancer Maternal Aunt        breast   Scheduled Meds: . aspirin EC  81 mg Oral Daily  . atorvastatin  40 mg Oral q1800  . cephALEXin  250 mg Oral Q8H  . cholecalciferol  2,000 Units Oral Daily  . enoxaparin (LOVENOX) injection  40 mg Subcutaneous Q24H  . feeding supplement (ENSURE ENLIVE)  237 mL Oral BID BM  . mirtazapine  15 mg Oral QHS  .  QUEtiapine  12.5 mg Oral BID WC  . QUEtiapine  50 mg Oral QHS   Continuous Infusions: PRN Meds:.acetaminophen **OR** acetaminophen (TYLENOL) oral liquid 160 mg/5 mL **OR** acetaminophen, haloperidol lactate, ondansetron (ZOFRAN) IV, senna-docusate Allergies  Allergen Reactions  . Prednisone Nausea And Vomiting, Nausea Only and Other (See Comments)  . Chlorphen-Pe-Acetaminophen Other (See Comments)    Reaction: unknown    Vital Signs: BP 135/68 (BP Location: Left Arm)   Pulse 66   Temp 98.3 F (36.8 C)   Resp 17   Ht  (1.6 m)   Wt 46.6 kg   SpO2 92%   BMI 18.21 kg/m  Pain Scale: 0-10  Pain Score: 0-No pain   SpO2: SpO2: 92 % O2 Device:SpO2: 92 % O2 Flow Rate: .   IO: Intake/output summary:   Intake/Output Summary (Last 24 hours) at 10/06/2018 1239 Last data filed at 10/05/2018 1700 Gross per 24 hour  Intake 1663.5 ml  Output 300 ml  Net 1363.5 ml    LBM: Last BM Date: 10/05/18 Baseline Weight: Weight: 46.6 kg Most recent weight: Weight: 46.6 kg     Palliative Assessment/Data: PPS 50%    The above conversation was completed via telephone due to the visitor restrictions during the COVID-19 pandemic. Thorough chart review and discussion with necessary members of the care team was completed as part of assessment. All issues were discussed and addressed but no physical exam was performed.  Time Total: 50 minutes Greater than 50%  of this time was spent counseling and coordinating care related to the above assessment and plan.  Gerlean Ren, DNP, AGNP-C Palliative Medicine Team [phone] Pager: [phone]

## 2018-10-06 NOTE — Plan of Care (Signed)
  Problem: Clinical Measurements: Goal: Ability to maintain clinical measurements within normal limits will improve Outcome: Progressing Goal: Will remain free from infection Outcome: Progressing Goal: Diagnostic test results will improve Outcome: Progressing   Problem: Pain Managment: Goal: General experience of comfort will improve Outcome: Progressing   Problem: Safety: Goal: Ability to remain free from injury will improve Outcome: Progressing   Problem: Skin Integrity: Goal: Risk for impaired skin integrity will decrease Outcome: Progressing   
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
## 2018-10-06 NOTE — Progress Notes (Signed)
[facility] [place] at [facility]   PATIENT NAME: Deborah BurnetBetty [name]    MR#:  [id number]  DATE OF BIRTH:  April 16, 1946  SUBJECTIVE:  Very belligerent. usig foull language. Drank Apple juice with me and then told me to leave her alone  Using foul language to the staff and threatening to punch them REVIEW OF SYSTEMS:  Review of system unobtainable from baseline dementia  DRUG ALLERGIES:   Allergies  Allergen Reactions  . Prednisone Nausea And Vomiting, Nausea Only and Other (See Comments)  . Chlorphen-Pe-Acetaminophen Other (See Comments)    Reaction: unknown    VITALS:  Blood pressure 135/68, pulse 66, temperature 98.3 F (36.8 C), resp. rate 17, height 5\' 3"  (1.6 m), weight 46.6 kg, SpO2 92 %.  PHYSICAL EXAMINATION:  Limited physical exam as patient is not cooperative  GENERAL:  73 y.o.-year-old patient lying in the bed with no acute distress.  EYES: Pupils equal, round, reactive to light and accommodation. No scleral icterus.  NECK:  Supple, no jugular venous distention. LUNGS:.  Normal respiratory effort no use of accessory muscles of respiration.  CARDIOVASCULAR: S1, S2 normal. No murmurs, rubs, or gallops.  NEUROLOGIC: Awake alert, altered sensation intact. Gait not checked.  PSYCHIATRIC: The patient is alert and disoriented with intermittent episodes of agitation baseline dementia SKIN: No obvious rash, lesion, or ulcer.    LABORATORY PANEL:   CBC Recent Labs  Lab 10/02/18 0555  WBC 6.8  HGB 11.3*  HCT 34.5*  PLT 158   ------------------------------------------------------------------------------------------------------------------  Chemistries  Recent Labs  Lab 10/01/18 1216  10/03/18 1951 10/05/18 1241  NA 138   < >  --  139  K 4.0   < >  --  3.5  CL 105   < >  --  110  CO2 23   < >  --  22  GLUCOSE 120*   < >  --  106*  BUN 26*   < >  --  18  CREATININE 1.29*   < >  --  0.98  CALCIUM 8.9   < >  --  8.4*  MG  --   --  2.0   --   AST 19  --   --   --   ALT 12  --   --   --   ALKPHOS 75  --   --   --   BILITOT 0.5  --   --   --    < > = values in this interval not displayed.   ------------------------------------------------------------------------------------------------------------------  Cardiac Enzymes Recent Labs  Lab 10/01/18 1216  TROPONINI <0.03   ------------------------------------------------------------------------------------------------------------------  RADIOLOGY:  No results found.  EKG:   Orders placed or performed during the hospital encounter of 10/01/18  . ED EKG  . ED EKG  . EKG 12-Lead  . EKG 12-Lead    ASSESSMENT AND PLAN:   #Delirium with acute psychosis, underlying dementia with aggressive behaviour from UTI -Urine cultures with Proteus mirabilis pansensitive except Macrobid, continue keflex -TSH normal  -Psychiatry consult , seen by Dr. Viviano SimasMaurer and Seroquel added to the regimen -Recommending to continue Remeron at home dose and avoid benzodiazepines -Not appropriate for inpt psych transfer -NOT COOPERATIVE for working with PT  #Subacute or chronic stroke-ruled out MRI, MRA of the brain negative Patient was not cooperating with echocardiogram Seen by neurology and treated the recommendations PT could not assess patient as patient is unable to follow commands at this time LDL 102   #  Chronic history of dementia at the intermittent aggressive behavior discontinued Aricept y Dr Viviano Simas Remeron dose increased to 30 mg as recommended by neurology  #Chronic kidney disease stage III creatinine close to baseline Avoid nephrotoxins and monitor renal function closely  #Disposition family is requesting skilled nursing facility PT could not evaluate her as she is agitated CSW for d/c planing ?palliative care   All the records are reviewed and case discussed with Care Management/Social Workerr.  Call placed and discussed with Chales Abrahams her daughter and given update on  10/04/2018.  She will be in her dad's funeral today as he just passed away  CODE STATUS: Partial code  TOTAL TIME TAKING CARE OF THIS PATIENT: 28  minutes.   POSSIBLE D/C IN ??  DAYS, DEPENDING ON CLINICAL CONDITION.  Note: This dictation was prepared with Dragon dictation along with smaller phrase technology. Any transcriptional errors that result from this process are unintentional.   Enedina Finner M.D on 10/06/2018 at 8:36 AM  Between 7am to 6pm - Pager - [phone] After 6pm go to [email or web address] - password [email or web address] [facility]  [facility]  Office  [phone]  CC: Primary care physician; Reubin Milan, MD

## 2018-10-06 NOTE — TOC Initial Note (Signed)
Transition of Care [facility](TOC) - Initial/Assessment Note    Patient Details  Name: Deborah [name] MRN: [id number] Date of Birth: Oct 03, 1945  Transition of Care [facility](TOC) CM/SW Contact:    Ruthe Mannanandace  Topaz Raglin, LCSWA Phone Number: 10/06/2018, 4:00 PM  Clinical Narrative:  CSW consulted for facility placement. CSW spoke with patient's guardian/daughter Deborah [name] [phone]. Daughter reports that patient lives with her husband and disabled child. Daughter reports that patient has mental illness and has a long history. CSW explained that currently patient is not cooperative with therapy and in order for her insurance to pay for SNF that a therapy consult has to be completed. CSW explained that since patient does not have medicaid, she would have to pay privately for SNF if they choose to go there which would be approximately $10,000 a month which would have to be paid up front. CSW also explained that the other option would be go home with home health. Daughter reports that they can not afford to pay privately and they can not take patient home at this time. Daughter states that she believes that if she can be here with patient then she may be more willing to work with therapy. CSW spoke with MD and therapy and both are in agreement to allow patient's daughter to come in to assist with therapy consult. Daughter plans to come tomorrow to assist with therapy consult. CSW will continue to follow for discharge planning.                  Expected Discharge Plan: Skilled Nursing Facility Barriers to Discharge: Continued Medical Work up, Unsafe home situation   Patient Goals and CMS Choice   CMS Medicare.gov Compare Post Acute Care list provided to:: Legal Guardian(Daughter- Deborah [name] ) Choice offered to / list presented to : Adult Children  Expected Discharge Plan and Services Expected Discharge Plan: Skilled Nursing Facility     Post Acute Care Choice: Skilled Nursing Facility Living arrangements for  the past 2 months: Single Family Home Expected Discharge Date: 10/04/18                                    Prior Living Arrangements/Services Living arrangements for the past 2 months: Single Family Home Lives with:: Spouse, Adult Children Patient language and need for interpreter reviewed:: Yes Do you feel safe going back to the place where you live?: Yes      Need for Family Participation in Patient Care: Yes (Comment) Care giver support system in place?: Yes (comment)   Criminal Activity/Legal Involvement Pertinent to Current Situation/Hospitalization: No - Comment as needed  Activities of Daily Living Home Assistive Devices/Equipment: None ADL Screening (condition at time of admission) Patient's cognitive ability adequate to safely complete daily activities?: No Is the patient deaf or have difficulty hearing?: No Does the patient have difficulty seeing, even when wearing glasses/contacts?: No Does the patient have difficulty concentrating, remembering, or making decisions?: Yes Patient able to express need for assistance with ADLs?: No Does the patient have difficulty dressing or bathing?: Yes Independently performs ADLs?: No Communication: Independent Dressing (OT): Dependent Is this a change from baseline?: Pre-admission baseline Grooming: Dependent Feeding: Independent Bathing: Dependent Is this a change from baseline?: Pre-admission baseline Toileting: Dependent Is this a change from baseline?: Pre-admission baseline In/Out Bed: Needs assistance Is this a change from baseline?: Pre-admission baseline Walks in Home: Needs assistance Does the patient have difficulty  walking or climbing stairs?: Yes Weakness of Legs: None Weakness of Arms/Hands: Right  Permission Sought/Granted Permission sought to share information with : Case Manager, Magazine features editor, Family Supports Permission granted to share information with : Yes, Verbal Permission  Granted              Emotional Assessment Appearance:: Appears stated age Attitude/Demeanor/Rapport: Aggressive (Verbally and/or physically) Affect (typically observed): Agitated Orientation: : Oriented to Self, Oriented to Place Alcohol / Substance Use: Not Applicable Psych Involvement: Yes (comment)  Admission diagnosis:  Stroke (HCC) [I63.9] Urinary tract infection without hematuria, site unspecified [N39.0] Altered mental status, unspecified altered mental status type [R41.82] Patient Active Problem List   Diagnosis Date Noted  . Goals of care, counseling/discussion   . Palliative care by specialist   . Acute delirium 10/03/2018  . Stroke (HCC) 10/01/2018  . Protein-calorie malnutrition, mild (HCC) 02/17/2018  . Vitamin D deficiency 06/20/2017  . Arthritis 06/17/2017  . Hx of peptic ulcer 06/17/2017  . IBS (irritable bowel syndrome) 06/17/2017  . Unintended weight loss 06/17/2017  . CKD (chronic kidney disease) stage 3, GFR 30-59 ml/min (HCC) 06/17/2017  . Balance problem 11/09/2016  . Late onset Alzheimer's disease with behavioral disturbance (HCC) 11/09/2016  . Macrocytic anemia 08/03/2016  . Hx of breast cancer 07/28/2016  . Folate deficiency 10/02/2015  . Bipolar 1 disorder, depressed (HCC) 09/25/2015  . Smoker 09/25/2015  . B12 deficiency 09/11/2015  . Anxiety 07/23/2015  . Severe episode of recurrent major depressive disorder (HCC) 07/03/2015  . History of migraine headaches 09/08/2014  . H/O Bell's palsy 09/08/2014  . H/O: HTN (hypertension) 09/08/2014  . Gastroesophageal reflux disease without esophagitis 09/08/2014  . COPD (chronic obstructive pulmonary disease) (HCC) 09/08/2014  . History of asthma 09/08/2014  . Breast neoplasm, Tis (LCIS) 01/23/2013   PCP:  Reubin Milan, MD Pharmacy:   [facility] [address], [place] - [address] [place] [address] Phone: [phone] Fax: [phone]     Social Determinants of Health  (SDOH) Interventions    Readmission Risk Interventions No flowsheet data found.

## 2018-10-06 NOTE — Progress Notes (Signed)
PT Cancellation Note  Patient Details Name: Deborah [name] MRN: [id number] DOB: August 23, 1945   Cancelled Treatment:    Reason Eval/Treat Not Completed: (Re-consult received. Per attending physician, recommends continued hold until agitation/aggression further improved.  Will continue to follow and re-attempt next date as medically appropriate.)   Ronnie Doo H. Manson Passey, PT, DPT, NCS 10/06/18, 11:21 AM [phone]

## 2018-10-07 MED ORDER — CEPHALEXIN 250 MG/5ML PO SUSR
250.0000 mg | Freq: Three times a day (TID) | ORAL | Status: AC
  Administered 2018-10-07 (×2): 250 mg via ORAL
  Filled 2018-10-07 (×2): qty 5

## 2018-10-07 NOTE — Progress Notes (Signed)
   10/07/18 1453  PT Visit Information  Last PT Received On 10/07/18  Assistance Needed +2  History of Present Illness Deborah [name] is a 73yo female who comes to [facility] on 5/17 c increased agression and agitation over the past two weeks. [name] also reports some decreased use of RUE over the past few weeks. MRI showing infarcts in Lt frontal lobe and Lt parietooccipital area as well. Pt also with (+) UTI. PTA pt was total care via her husband, AMB household distances with HHA assist and minGuard support, pt needed total assist with ADL completion d/t volitional inhibition, varrying level of participation by typically requiring heavy cuing and motivation to complete basic tasks. Her husband is now acutely hospitalized at [facility] s/p GI surgery.     Patient suffers from debility and inability to walk which impairs their ability to perform daily activities like dressing, bathing, toileting and mobility in the home.  A walker alone will not resolve the issues with performing activities of daily living as the patient has advanced dementia. A wheelchair will allow patient to safely perform daily activities.  The patient has a caregiver who can provide assistance.    2:55 PM, 10/07/18 Rosamaria Lints, PT, DPT Physical Therapist - [facility]  [phone] [facility])

## 2018-10-07 NOTE — Progress Notes (Signed)
[facility] - D[facility] at [facility]   PATIENT NAME: Deborah [name]    MR#:  [id number]  DATE OF BIRTH:  1946-03-07  SUBJECTIVE:  Becomes paranoid and startled with people around  REVIEW OF SYSTEMS:  Review of system unobtainable from baseline dementia  DRUG ALLERGIES:   Allergies  Allergen Reactions  . Prednisone Nausea And Vomiting, Nausea Only and Other (See Comments)  . Chlorphen-Pe-Acetaminophen Other (See Comments)    Reaction: unknown    VITALS:  Blood pressure 99/79, pulse 78, temperature 98.9 F (37.2 C), temperature source Oral, resp. rate 15, height 5\' 3"  (1.6 m), weight 46.6 kg, SpO2 95 %.  PHYSICAL EXAMINATION:  Limited physical exam as patient is not cooperative  GENERAL:  73 y.o.-year-old patient lying in the bed with no acute distress. thin EYES: Pupils equal, round, reactive to light and accommodation. No scleral icterus.  NECK:  Supple, no jugular venous distention. LUNGS:.  Normal respiratory effort no use of accessory muscles of respiration.  CARDIOVASCULAR: S1, S2 normal. No murmurs, rubs, or gallops.  NEUROLOGIC: Awake alert, altered sensation intact. Gait not checked.  PSYCHIATRIC: The patient is alert and disoriented with intermittent episodes of agitation baseline dementia SKIN: No obvious rash, lesion, or ulcer.    LABORATORY PANEL:   CBC Recent Labs  Lab 10/02/18 0555  WBC 6.8  HGB 11.3*  HCT 34.5*  PLT 158   ------------------------------------------------------------------------------------------------------------------  Chemistries  Recent Labs  Lab 10/01/18 1216  10/03/18 1951 10/05/18 1241  NA 138   < >  --  139  K 4.0   < >  --  3.5  CL 105   < >  --  110  CO2 23   < >  --  22  GLUCOSE 120*   < >  --  106*  BUN 26*   < >  --  18  CREATININE 1.29*   < >  --  0.98  CALCIUM 8.9   < >  --  8.4*  MG  --   --  2.0  --   AST 19  --   --   --   ALT 12  --   --   --   ALKPHOS 75  --   --   --    BILITOT 0.5  --   --   --    < > = values in this interval not displayed.   ------------------------------------------------------------------------------------------------------------------  Cardiac Enzymes Recent Labs  Lab 10/01/18 1216  TROPONINI <0.03   ------------------------------------------------------------------------------------------------------------------  RADIOLOGY:  No results found.  EKG:   Orders placed or performed during the hospital encounter of 10/01/18  . ED EKG  . ED EKG  . EKG 12-Lead  . EKG 12-Lead    ASSESSMENT AND PLAN:   #Delirium with acute psychosis, underlying dementia with aggressive behaviour from UTI -Urine cultures with Proteus mirabilis pansensitive except Macrobid, continue keflex -TSH normal  -Psychiatry consult , seen by Dr. Viviano Simas and Seroquel added to the regimen -Recommending to continue Remeron at home dose and avoid benzodiazepines -Not appropriate for inpt psych transfer -NOT COOPERATIVE for working with PT--trying with dter in the room with PT today  #Subacute or chronic stroke-ruled out MRI, MRA of the brain negative Patient was not cooperating with echocardiogram Seen by neurology and treated the recommendations PT could not assess patient as patient is unable to follow commands at this time LDL 102  #Chronic history of dementia at the intermittent aggressive behavior discontinued Aricept  y Dr Viviano SimasMaurer Remeron dose increased to 30 mg as recommended by neurology  #Chronic kidney disease stage III creatinine close to baseline Avoid nephrotoxins and monitor renal function closely  #Disposition family is requesting skilled nursing facility PT could not evaluate her as she is agitated CSW for d/c planing ?palliative care  Spoke with dter [name]  CODE STATUS: DNR TOTAL TIME TAKING CARE OF THIS PATIENT: 28  minutes.   POSSIBLE D/C IN ??  DAYS, DEPENDING ON CLINICAL CONDITION.  Note: This dictation was prepared  with Dragon dictation along with smaller phrase technology. Any transcriptional errors that result from this process are unintentional.   Enedina FinnerSona Emelee Rodocker M.D on 10/07/2018 at 2:36 PM  Between 7am to 6pm - Pager - [phone] After 6pm go to [email or web address] - password [email or web address] [facility]  [facility]  Office  [phone]  CC: Primary care physician; Reubin MilanBerglund, Laura H, MD

## 2018-10-07 NOTE — Evaluation (Signed)
Physical Therapy Evaluation Patient Details Name: Deborah [name] MRN: [id number] DOB: August 21, 1945 Today's Date: 10/07/2018   History of Present Illness  Deborah [name] is a 73yo female who comes to [facility] on 5/17 c increased agression and agitation over the past two weeks. [name] also reports some decreased use of RUE over the past few weeks. MRI showing infarcts in Lt frontal lobe and Lt parietooccipital area as well. Pt also with (+) UTI. PTA pt was total care via her husband, AMB household distances with HHA assist and minGuard support, pt needed total assist with ADL completion d/t volitional inhibition, varrying level of participation by typically requiring heavy cuing and motivation to complete basic tasks. Her husband is now acutely hospitalized at [facility] s/p GI surgery.   Clinical Impression  Pt admitted with above diagnosis. Pt currently with functional limitations due to the deficits listed below (see "PT Problem List"). Author arrived prior to evaluation to introduce self to patient and orient self to patient as pt presentation since admission has made it difficult for staff to anticipate the patient's needs and achieve pt buy in and participation. Upon entry, pt in bed, awake and easily startled as per RN report. Pt maintains eye contact throughout, minimally communicative, but does respond to some basic questioning, but easily overstimulated and agitated, with subsequent verbal outbursts asking author to go away or leave patient alone every 45-60 seconds. Later upon return it is determined that patient is a poor historian and none of pt's responses from earlier were factually accurate.  Author returned to room 2 hours later after DTR/guardian Chales Abrahams was in attendance. The pt is alert, still not participating in orientation questions. DTR appears to have a mild to moderate anxiolytic affect on patient, who has been fairly participatory with this familiar face, largely for meal completion.  History collected from DTR. Over the hour in room, pt given cues for strength testing, bed mobility, pericare and linen changes, transfers, and AMB. DTR is allowed to facilitate and initiate some of these activities per baseline levels of need, but hospital staff assists with care that is not typically performed by DTR, and mobility as well once the patient appears to requires more physical assistance for safety. Pt follows verbal cues from DTR >50% of the time, from author and NA less than 10% of the time. Engagement from Chartered loss adjuster for mobility assessment appears to trigger more apprehension, fear, and agitation from the patient, which even the DTR is unable to mediate. Pt ultimately not even participating in basic MMT for hands and arms, but rather yelling obscenities in spite of encouragement from DTR and positive physical approach from author. Pt is able to be made calm during pericare via DTR who provides constant soothing and reassurance. At EOB, pt demonstrating trunk weakness, leaning on Left elbow, when given cues to sit tall, she does the opposite and collapses onto the bed. Min-modA for rise from bed assisted by DTR, pt immediately goes to the chair and then progressively becomes more weak appearing and somewhat drowsy, after 5 minutes DTR unable to transfer to standing. Author moves chair to bedside and performs a total assist pivot transfer after pt observed to be not participating or following cues and now demonstrating unsafe behaviors- pt still seated in chair has now collapsed forward with trunk/elbows supported on bed with head on arms as if attempting to sleep. DTR educated on the value of keeping patients with dementia in a familiar environment for optimal mentation, however, it  is not clear that this will be achieved. The patient was total care PTA and now has no caregivers in the home. Given recent medical status of patient's husband, it is unclear that he will be able to provide care to the  patient in the foreseeable future. Pt will continue to require total assistance for ADL as PTA, and should have 24/7 caregiver assistance for safety, as patient has poor safety awareness and poor insight of limitations. Due to inability to participate, PT services are ill-advised at this time and clearly not the best vehicle for maintenance of strength, function, and quality of life for this patient. As patient is non-ambulatory at this time, she will require a WC for ADL completion at DC. As patient is not able to participate in PT services, PT will signoff at this time.      Follow Up Recommendations No PT follow up;Supervision/Assistance - 24 hour;Supervision for mobility/OOB    Equipment Recommendations  Wheelchair (measurements PT);Wheelchair cushion (measurements PT);Other (comment)(elevated leg rests; )    Recommendations for Other Services       Precautions / Restrictions Precautions Precautions: Fall Restrictions Weight Bearing Restrictions: No      Mobility  Bed Mobility Overal bed mobility: Needs Assistance Bed Mobility: Rolling Rolling: Mod assist         General bed mobility comments: somewhat participatory as DTR is in room helping calm patient and encourage participation   Transfers Overall transfer level: Needs assistance   Transfers: Sit to/from Stand;Stand Pivot Transfers Sit to Stand: Mod assist(DTR assisting to standing; pt reaching out for fixed objects in room for stability, imediately makes her way to a chair and sits) Stand pivot transfers: Max assist(much weaker after sitting in chair for 5 minutes; DTR unable to transfer patient; author slides chair next to bed, and pt assisted back to bed maxA from PT. )       General transfer comment: Pt not following verbal commands, but more participatory I suspect because of feeling ill and weak.   Ambulation/Gait Ambulation/Gait assistance: (3 bed to chair. appears weak; unsafe to attempt more. )               Stairs            Wheelchair Mobility    Modified Rankin (Stroke Patients Only)       Balance Overall balance assessment: Needs assistance   Sitting balance-Leahy Scale: Poor Sitting balance - Comments: poor safety awareness; becomes more weak after being up, and needs to lean for support , then unsafe impulsive behavior.    Standing balance support: During functional activity Standing balance-Leahy Scale: Zero                               Pertinent Vitals/Pain Pain Assessment: (unable to determine; unreliable pt report and scowl on face at baseline. )    Home Living Family/patient expects to be discharged to:: Assisted living(Pt is totalcare at baseline, but her primary caregiver is acutely ill and unable to care for her. ) Living Arrangements: Children;Spouse/significant other([facility] and her other DTR Gertie GowdaCarol Ann who has mental health/substance abuse problems at present)             Home Equipment: None      Prior Function Level of Independence: Needs assistance   Gait / Transfers Assistance Needed: Pt AMB short distances holding onto arm of caregiver, but would require frequent sitting  rests; minA for coming to standing.   ADL's / Homemaking Assistance Needed: Husband would help with dressing, bathing, and pericare. Pt using diapers PTA, typically did not communicate toiletting needs or being soiled.   Comments: Pt has long history of 'mental health issues' per DTR was medicated for depression for 20-25 years wherein she slept 16-20 hours daily. More dementia related disturbance in the last 2 years.      Hand Dominance   Dominant Hand: Right(recent RUE use avoidance)    Extremity/Trunk Assessment   Upper Extremity Assessment Upper Extremity Assessment: Generalized weakness(limiting use of RUE, suspect d/t MRI findings of CVA)    Lower Extremity Assessment Lower Extremity Assessment: Generalized weakness        Communication   Communication: No difficulties  Cognition Arousal/Alertness: Awake/alert Behavior During Therapy: Agitated;Anxious;Impulsive Overall Cognitive Status: History of cognitive impairments - at baseline(similar to baseline.)                                        General Comments      Exercises     Assessment/Plan    PT Assessment Patent does not need any further PT services  PT Problem List         PT Treatment Interventions      PT Goals (Current goals can be found in the Care Plan section)  Acute Rehab PT Goals Patient Stated Goal: have 24/7 caregiver services for her mother now that the patient's husband is acutely ill and unable to help PT Goal Formulation: All assessment and education complete, DC therapy    Frequency     Barriers to discharge        Co-evaluation               AM-PAC PT "6 Clicks" Mobility  Outcome Measure Help needed turning from your back to your side while in a flat bed without using bedrails?: A Lot Help needed moving from lying on your back to sitting on the side of a flat bed without using bedrails?: A Lot Help needed moving to and from a bed to a chair (including a wheelchair)?: A Lot Help needed standing up from a chair using your arms (e.g., wheelchair or bedside chair)?: A Lot Help needed to walk in hospital room?: A Lot Help needed climbing 3-5 steps with a railing? : Total 6 Click Score: 11    End of Session   Activity Tolerance: Patient limited by fatigue;Patient limited by lethargy;Treatment limited secondary to agitation Patient left: in bed;with bed alarm set;with call bell/phone within reach;with family/visitor present Nurse Communication: Mobility status      Time: 1240-1338 PT Time Calculation (min) (ACUTE ONLY): 58 min   Charges:   PT Evaluation $PT Eval Moderate Complexity: 1 Mod          2:51 PM, 10/07/18 Rosamaria Lints, PT, DPT Physical Therapist - [facility]  [phone] (ASCOM)   Aadin Gaut C 10/07/2018, 2:25 PM

## 2018-10-08 NOTE — NC FL2 (Signed)
[place] MEDICAID FL2 LEVEL OF CARE SCREENING TOOL     IDENTIFICATION  Patient Name: Deborah [name] Birthdate: 08-Apr-1946 Sex: female Admission Date (Current Location): 10/01/2018  [place] and [place] Number:  Chiropodist and Address:  [facility], [address], [place], [place] [address]      Provider Number: [id number]  Attending Physician Name and Address:  Enedina Finner, MD  Relative Name and Phone Number:  Benjamine Mola Daughter [phone] or Dorian Furnace Daughter [phone]  [phone] or Laynah, Cliver [phone]     Current Level of Care: Hospital Recommended Level of Care: Skilled Nursing Facility Prior Approval Number:    Date Approved/Denied:   PASRR Number: Pending  Discharge Plan: SNF    Current Diagnoses: Patient Active Problem List   Diagnosis Date Noted  . Goals of care, counseling/discussion   . Palliative care by specialist   . Acute delirium 10/03/2018  . Stroke (HCC) 10/01/2018  . Protein-calorie malnutrition, mild (HCC) 02/17/2018  . Vitamin D deficiency 06/20/2017  . Arthritis 06/17/2017  . Hx of peptic ulcer 06/17/2017  . IBS (irritable bowel syndrome) 06/17/2017  . Unintended weight loss 06/17/2017  . CKD (chronic kidney disease) stage 3, GFR 30-59 ml/min (HCC) 06/17/2017  . Balance problem 11/09/2016  . Late onset Alzheimer's disease with behavioral disturbance (HCC) 11/09/2016  . Macrocytic anemia 08/03/2016  . Hx of breast cancer 07/28/2016  . Folate deficiency 10/02/2015  . Bipolar 1 disorder, depressed (HCC) 09/25/2015  . Smoker 09/25/2015  . B12 deficiency 09/11/2015  . Anxiety 07/23/2015  . Severe episode of recurrent major depressive disorder (HCC) 07/03/2015  . History of migraine headaches 09/08/2014  . H/O Bell's palsy 09/08/2014  . H/O: HTN (hypertension) 09/08/2014  . Gastroesophageal reflux disease without esophagitis 09/08/2014  . COPD  (chronic obstructive pulmonary disease) (HCC) 09/08/2014  . History of asthma 09/08/2014  . Breast neoplasm, Tis (LCIS) 01/23/2013    Orientation RESPIRATION BLADDER Height & Weight     Self  Normal Incontinent Weight: 102 lb 12.8 oz (46.6 kg) Height:  5\' 3"  (160 cm)  BEHAVIORAL SYMPTOMS/MOOD NEUROLOGICAL BOWEL NUTRITION STATUS  Verbally abusive   Continent Diet(Regular)  AMBULATORY STATUS COMMUNICATION OF NEEDS Skin   Limited Assist   Normal                       Personal Care Assistance Level of Assistance  Bathing, Feeding, Dressing Bathing Assistance: Limited assistance Feeding assistance: Limited assistance Dressing Assistance: Limited assistance     Functional Limitations Info  Sight, Hearing, Speech Sight Info: Adequate Hearing Info: Adequate Speech Info: Adequate    SPECIAL CARE FACTORS FREQUENCY  PT (By licensed PT)     PT Frequency: Minimum 5x a week              Contractures Contractures Info: Not present    Additional Factors Info  Code Status, Allergies, Psychotropic Code Status Info: DNR Allergies Info: PREDNISONE, CHLORPHEN-PE-ACETAMINOPHEN  Psychotropic Info: divalproex (DEPAKOTE SPRINKLE) capsule 125 mg and mirtazapine (REMERON) tablet 15 mg and QUEtiapine (SEROQUEL) tablet 12.5 mg and QUEtiapine (SEROQUEL) tablet 50 mg          Current Medications (10/08/2018):  This is the current hospital active medication list Current Facility-Administered Medications  Medication Dose Route Frequency Provider Last Rate Last Dose  . acetaminophen (TYLENOL) tablet 650 mg  650 mg Oral Q4H PRN Mayo, Allyn Kenner, MD       Or  . acetaminophen (TYLENOL)  solution 650 mg  650 mg Per Tube Q4H PRN Mayo, Allyn KennerKaty Dodd, MD       Or  . acetaminophen (TYLENOL) suppository 650 mg  650 mg Rectal Q4H PRN Mayo, Allyn KennerKaty Dodd, MD      . aspirin EC tablet 81 mg  81 mg Oral Daily Mayo, Allyn KennerKaty Dodd, MD   81 mg at 10/08/18 1023  . atorvastatin (LIPITOR) tablet 40 mg  40 mg Oral q1800  Mayo, Allyn KennerKaty Dodd, MD   40 mg at 10/05/18 1753  . cholecalciferol (VITAMIN D) tablet 2,000 Units  2,000 Units Oral Daily Mayo, Allyn KennerKaty Dodd, MD   2,000 Units at 10/08/18 1023  . divalproex (DEPAKOTE SPRINKLE) capsule 125 mg  125 mg Oral Q12H Charm RingsLord, Jamison Y, NP   125 mg at 10/08/18 1022  . enoxaparin (LOVENOX) injection 40 mg  40 mg Subcutaneous Q24H Gouru, Aruna, MD   40 mg at 10/05/18 1753  . feeding supplement (ENSURE ENLIVE) (ENSURE ENLIVE) liquid 237 mL  237 mL Oral BID BM Gouru, Aruna, MD   237 mL at 10/08/18 1025  . mirtazapine (REMERON) tablet 15 mg  15 mg Oral QHS Mariel CraftMaurer, Sheila M, MD   15 mg at 10/07/18 2325  . OLANZapine (ZYPREXA) tablet 2.5 mg  2.5 mg Oral Daily PRN Charm RingsLord, Jamison Y, NP       Or  . OLANZapine (ZYPREXA) injection 2.5 mg  2.5 mg Intramuscular Daily PRN Charm RingsLord, Jamison Y, NP      . ondansetron [facility](ZOFRAN) injection 4 mg  4 mg Intravenous Q6H PRN Oralia ManisWillis, David, MD   4 mg at 10/02/18 0558  . QUEtiapine (SEROQUEL) tablet 12.5 mg  12.5 mg Oral BID WC Mariel CraftMaurer, Sheila M, MD   12.5 mg at 10/08/18 1225  . QUEtiapine (SEROQUEL) tablet 50 mg  50 mg Oral QHS Mariel CraftMaurer, Sheila M, MD   50 mg at 10/07/18 2324  . senna-docusate (Senokot-S) tablet 1 tablet  1 tablet Oral QHS PRN Mayo, Allyn KennerKaty Dodd, MD   1 tablet at 10/04/18 1826     Discharge Medications: Please see discharge summary for a list of discharge medications.  Relevant Imaging Results:  Relevant Lab Results:   Additional Information SSN [id number]  Darleene Cleavernterhaus, Mell Mellott R, LCSW

## 2018-10-08 NOTE — TOC Progression Note (Signed)
Transition of Care [facility]) - Progression Note    Patient Details  Name: Deborah [name] MRN: [id number] Date of Birth: 01-16-1946  Transition of Care [facility]) CM/SW Contact  Darleene Cleaver, [place] Phone Number: 10/08/2018, 2:28 PM  Clinical Narrative:     CSW spoke to patient's daughter Nita Sells, [phone], she stated that she can not assist with helping taking care of patient and that she would like her placed in a SNF for short term rehab, and transition her to either LTC at [facility] or ALF.  Patient's daughter was explained that because of the dementia and not working with therapy very well, it may be hard for insurance company to approve patient.  CSW also informed daughter that because of some of the behavioral issues it may be difficult to place as well.  CSW explained to patient's daughter if patient does not get any bed offers or insurance approval, then patient will have to go home with home health or pay privately for SNF.  Patient's daughter said they can not afford to pay privately, CSW advised her to contact [facility] DSS to see if patient would be able to get approved for special assistance long term care Medicaid.  CSW informed her that it takes 30-45 days for Medicaid to be approved, and if patient goes to a facility they will have to pay privately, but will be reimbursed if she is approved.  Patient's daughter was also told that patient may have to go out of county because of the number of limited beds locally that can accept patient and have Medicaid beds available.  Patient's daughter expressed understanding, and asked CSW to look for SNFs for patient.  CSW sent patient's clinical information on the hub awaiting bed offers, patient also has a pending [facility] number due to manual screen.  Patient's daughter was informed that if patient goes home with home health, a nurse, aide, PT, OT, ST, and social worker can be assigned to help with trying to find placement from home.  CSW was informed  that patient normally walks by holding hand of patient's husband.  CSW was informed by patient's daughter that patient's husband is currently at [facility] in the hospital, and daughter does not think patient's husband can continue to take care of patient.  CSW informed patient's daughter, that CSW will try to find SNF placement for patient but be prepared that patient may not get a bed offer.  CSW to continue to follow patient's progress throughout discharge planning.   Expected Discharge Plan: Skilled Nursing Facility Barriers to Discharge: Continued Medical Work up, Unsafe home situation  Expected Discharge Plan and Services Expected Discharge Plan: Skilled Nursing Facility     Post Acute Care Choice: Skilled Nursing Facility Living arrangements for the past 2 months: Single Family Home Expected Discharge Date: 10/04/18                             Social Determinants of Health (SDOH) Interventions    Readmission Risk Interventions No flowsheet data found.

## 2018-10-08 NOTE — Progress Notes (Signed)
[facility] Physicians - [place] at [facility]   PATIENT NAME: Deborah [name]    MR#:  [id number]  DATE OF BIRTH:  11-28-1945  SUBJECTIVE:  Resting quietly patient not able to work with PT yesterday after spending one hour with her and presence of her daughter.  REVIEW OF SYSTEMS:  Review of system unobtainable from baseline dementia  DRUG ALLERGIES:   Allergies  Allergen Reactions  . Prednisone Nausea And Vomiting, Nausea Only and Other (See Comments)  . Chlorphen-Pe-Acetaminophen Other (See Comments)    Reaction: unknown    VITALS:  Blood pressure 101/69, pulse 67, temperature 98.5 F (36.9 C), temperature source Oral, resp. rate 17, height 5\' 3"  (1.6 m), weight 46.6 kg, SpO2 96 %.  PHYSICAL EXAMINATION:  Limited physical exam as patient is not cooperative  GENERAL:  73 y.o.-year-old patient lying in the bed with no acute distress. thin EYES: Pupils equal, round, reactive to light and accommodation. No scleral icterus.  NECK:  Supple, no jugular venous distention. LUNGS:.  Normal respiratory effort no use of accessory muscles of respiration.  CARDIOVASCULAR: S1, S2 normal. No murmurs, rubs, or gallops.  NEUROLOGIC: Awake alert, altered sensation intact. Gait not checked.  PSYCHIATRIC: The patient is alert and disoriented with intermittent episodes of agitation baseline dementia SKIN: No obvious rash, lesion, or ulcer.    LABORATORY PANEL:   CBC Recent Labs  Lab 10/02/18 0555  WBC 6.8  HGB 11.3*  HCT 34.5*  PLT 158   ------------------------------------------------------------------------------------------------------------------  Chemistries  Recent Labs  Lab 10/01/18 1216  10/03/18 1951 10/05/18 1241  NA 138   < >  --  139  K 4.0   < >  --  3.5  CL 105   < >  --  110  CO2 23   < >  --  22  GLUCOSE 120*   < >  --  106*  BUN 26*   < >  --  18  CREATININE 1.29*   < >  --  0.98  CALCIUM 8.9   < >  --  8.4*  MG  --   --  2.0  --   AST 19   --   --   --   ALT 12  --   --   --   ALKPHOS 75  --   --   --   BILITOT 0.5  --   --   --    < > = values in this interval not displayed.   ------------------------------------------------------------------------------------------------------------------  Cardiac Enzymes Recent Labs  Lab 10/01/18 1216  TROPONINI <0.03   ------------------------------------------------------------------------------------------------------------------  RADIOLOGY:  No results found.  EKG:   Orders placed or performed during the hospital encounter of 10/01/18  . ED EKG  . ED EKG  . EKG 12-Lead  . EKG 12-Lead    ASSESSMENT AND PLAN:   #Delirium with acute psychosis, underlying dementia with aggressive behaviour from UTI -Urine cultures with Proteus mirabilis pansensitive except Macrobid, continue keflex -TSH normal  -Psychiatry consult , seen by Dr. Viviano Simas and Seroquel added to the regimen - continue Remeron at home dose and avoid benzodiazepines -Not appropriate for inpt psych transfer -NOT COOPERATIVE for working with PT even after -trying with dter in the room  #Subacute or chronic stroke-ruled out MRI, MRA of the brain negative Patient was not cooperating with echocardiogram Seen by neurology and treated the recommendations PT could not assess patient as patient is unable to follow commands at this time LDL  102  #Chronic history of dementia at the intermittent aggressive behavior discontinued Aricept by Dr Viviano SimasMaurer Remeron dose increased to 30 mg as recommended by neurology  #Chronic kidney disease stage III creatinine close to baseline Avoid nephrotoxins and monitor renal function closely  #Disposition family is requesting skilled nursing facility PT could not evaluate her as she is agitated CSW for d/c planing Seen by palliative care  CSW to assist with d/c planning.   CODE STATUS: DNR TOTAL TIME TAKING CARE OF THIS PATIENT: 25  minutes.   POSSIBLE D/C IN ??  DAYS,  DEPENDING ON CLINICAL CONDITION.  Note: This dictation was prepared with Dragon dictation along with smaller phrase technology. Any transcriptional errors that result from this process are unintentional.   Enedina FinnerSona Jerrod Damiano M.D on 10/08/2018 at 12:00 PM  Between 7am to 6pm - Pager - [phone] After 6pm go to [email or web address] - password [email or web address] [facility]  [facility] [place] [facility]  Office  [phone]  CC: Primary care physician; Reubin MilanBerglund, Laura H, MD

## 2018-10-08 NOTE — Plan of Care (Signed)
Pt refused to be assessed during the shift.  Agitated and anxious during pt care. Pt wants staff out of the room. Pt is calm when staff not in the room.

## 2018-10-09 NOTE — Progress Notes (Signed)
[facility] Physicians - [place] at [facility]   PATIENT NAME: Deborah [name]    MR#:  [id number]  DATE OF BIRTH:  1945/08/17  SUBJECTIVE:  Resting quietly Paranoia, not eating much.  REVIEW OF SYSTEMS:  Review of system unobtainable from baseline dementia  DRUG ALLERGIES:   Allergies  Allergen Reactions  . Prednisone Nausea And Vomiting, Nausea Only and Other (See Comments)  . Chlorphen-Pe-Acetaminophen Other (See Comments)    Reaction: unknown    VITALS:  Blood pressure 95/62, pulse 63, temperature 97.9 F (36.6 C), resp. rate 20, height 5\' 3"  (1.6 m), weight 46.6 kg, SpO2 98 %.  PHYSICAL EXAMINATION:  Limited physical exam as patient is not cooperative  GENERAL:  73 y.o.-year-old patient lying in the bed with no acute distress. Thin, cachectic EYES: Pupils equal, round, reactive to light and accommodation. No scleral icterus.  NECK:  Supple, no jugular venous distention. LUNGS:.  Normal respiratory effort no use of accessory muscles of respiration.  CARDIOVASCULAR: S1, S2 normal. No murmurs, rubs, or gallops.  NEUROLOGIC: Awake alert, altered sensation intact. Gait not checked.  PSYCHIATRIC: patient is alert and disoriented with intermittent episodes of agitation baseline severe dementia SKIN: No obvious rash, lesion, or ulcer.    LABORATORY PANEL:   CBC No results for input(s): WBC, HGB, HCT, PLT in the last 168 hours. ------------------------------------------------------------------------------------------------------------------  Chemistries  Recent Labs  Lab 10/03/18 1951 10/05/18 1241  NA  --  139  K  --  3.5  CL  --  110  CO2  --  22  GLUCOSE  --  106*  BUN  --  18  CREATININE  --  0.98  CALCIUM  --  8.4*  MG 2.0  --    ------------------------------------------------------------------------------------------------------------------  Cardiac Enzymes No results for input(s): TROPONINI in the last 168  hours. ------------------------------------------------------------------------------------------------------------------  RADIOLOGY:  No results found.  EKG:   Orders placed or performed during the hospital encounter of 10/01/18  . ED EKG  . ED EKG  . EKG 12-Lead  . EKG 12-Lead    ASSESSMENT AND PLAN:   #Delirium with acute psychosis, underlying dementia with aggressive behaviour from UTI -Urine cultures with Proteus mirabilis pansensitive except Macrobid, completed course of keflex -TSH normal  -Psychiatry consult noted cont Remeron and Seroquel, PRN Zyprexa -Not appropriate for inpt psych transfer -NOT COOPERATIVE for working with PT even after -trying with dter in the room  #Subacute or chronic stroke-ruled out MRI, MRA of the brain negative Patient was not cooperating with echocardiogram Seen by neurology and treated the recommendations PT could not assess patient as patient is unable to follow commands at this time LDL 102  #Chronic history of dementia at the intermittent aggressive behavior discontinued Aricept by Dr Viviano Simas Remeron dose increased to 30 mg as recommended by neurology  #Chronic kidney disease stage III creatinine close to baseline Avoid nephrotoxins and monitor renal function closely  #Disposition family is requesting skilled nursing facility-- patient is not rehab appropriate given her severe dementia with aggressive behavior.  Seen by palliative care  CSW to assist with d/c planning.   CODE STATUS: DNR TOTAL TIME TAKING CARE OF THIS PATIENT: 25  minutes.   POSSIBLE D/C IN ??  DAYS, DEPENDING ON CLINICAL CONDITION.  Note: This dictation was prepared with Dragon dictation along with smaller phrase technology. Any transcriptional errors that result from this process are unintentional.   Enedina Finner M.D on 10/09/2018 at 12:27 PM  Between 7am to 6pm - Pager -  [phone] After 6pm go to [email or web address] - password [email or web address] [facility]  [facility] [place]  Hospitalists  Office  [phone]  CC: Primary care physician; Reubin MilanBerglund, Laura H, MD

## 2018-10-09 NOTE — Plan of Care (Signed)
  Problem: Clinical Measurements: Goal: Ability to maintain clinical measurements within normal limits will improve Outcome: Progressing Goal: Will remain free from infection Outcome: Progressing Goal: Diagnostic test results will improve Outcome: Progressing   Problem: Pain Managment: Goal: General experience of comfort will improve Outcome: Progressing   Problem: Safety: Goal: Ability to remain free from injury will improve Outcome: Progressing   

## 2018-10-10 MED ORDER — ENSURE ENLIVE PO LIQD: 237.0000 mL | Freq: Two times a day (BID) | ORAL | 12 refills | Status: DC

## 2018-10-10 MED ORDER — MIRTAZAPINE 15 MG PO TABS: 30.0000 mg | ORAL_TABLET | Freq: Every day | ORAL | 0 refills | Status: DC

## 2018-10-10 MED ORDER — ASPIRIN 81 MG PO TBEC: 81.0000 mg | DELAYED_RELEASE_TABLET | Freq: Every day | ORAL | 0 refills | Status: DC

## 2018-10-10 MED ORDER — QUETIAPINE FUMARATE 25 MG PO TABS: 12.5000 mg | ORAL_TABLET | Freq: Two times a day (BID) | ORAL | 0 refills | Status: DC

## 2018-10-10 MED ORDER — QUETIAPINE FUMARATE 50 MG PO TABS: 50.0000 mg | ORAL_TABLET | Freq: Every day | ORAL | 0 refills | Status: DC

## 2018-10-10 MED ORDER — DIVALPROEX SODIUM 125 MG PO CSDR: 125.0000 mg | DELAYED_RELEASE_CAPSULE | Freq: Two times a day (BID) | ORAL | 0 refills | Status: DC

## 2018-10-10 NOTE — Progress Notes (Signed)
Pt for discharge home. Alert. No resp distress. Sl d/cd.  pts dtr maryann tucker called and discharge instructions discussed with dtr. meds / diet / activity and f/u discussed.  dtr verbalized understanding.  She will pick pt up shortly.

## 2018-10-10 NOTE — Discharge Summary (Signed)
[facility] Phys[facility] - [place] at [facility]   PATIENT NAME: Deborah [name]    MR#:  [id number]  DATE OF BIRTH:  Sep 16, 1945  DATE OF ADMISSION:  10/01/2018 ADMITTING PHYSICIAN: Campbell Stall, MD  DATE OF DISCHARGE: 10/10/2018  PRIMARY CARE PHYSICIAN: Reubin Milan, MD    ADMISSION DIAGNOSIS:  Stroke [facility]) [I63.9] Urinary tract infection without hematuria, site unspecified [N39.0] Altered mental status, unspecified altered mental status type [R41.82]  DISCHARGE DIAGNOSIS:  delirium with acute psychosis/altered mental status/progressive decline due to dementia UTI completed antibiotic  SECONDARY DIAGNOSIS:   Past Medical History:  Diagnosis Date  . Anxiety   . Arthritis   . Asthma   . Bacteriuria 07/31/2016  . Bipolar 1 disorder (HCC)   . Bipolar 1 disorder, mixed, severe (HCC) 07/26/2016  . Cystitis   . Dementia (HCC)   . Depression   . Heart murmur   . IBS (irritable bowel syndrome)   . Osteoporosis   . Spastic colon   . Swallowing problem 11/09/2016  . Thrombocytopenia (HCC) 08/03/2016  . Ulcer     HOSPITAL COURSE:   #Delirium with acute psychosis, underlying dementia with aggressive behavior with UTI -Urine cultures with Proteus mirabilis pansensitive except Macrobid -completed course of keflex -TSH normal  -Psychiatry consult noted cont Remeron, Depakote and Seroquel -Not appropriate for inpt psych transfer -NOT COOPERATIVE for working with PT even after -trying with dter in the room  #Subacute or chronic stroke-ruled out MRI, MRA of the brain negative Patient was not cooperating with echocardiogram Seen by neurology and appreciate the recommendations PT could not assess patient as patient is unable to follow commands at this time LDL 102  #Chronic history of dementia at the intermittent aggressive behavior discontinued Aricept by Dr Viviano Simas continue meds as above  #Chronic kidney disease stage III creatinine close to  baseline Avoid nephrotoxins and monitor renal function closely  #Disposition family is requesting skilled nursing facility-- patient is not rehab appropriate given her severe dementia with aggressive behavior.  Seen by palliative care-- patient is DNR  CSW to assist with d/c planning-- daughter called nursing staff and is okay with patient going home.  Will discharge patient to home  CONSULTS OBTAINED:  Treatment Team:  Kym Groom, MD Mariel Craft, MD  DRUG ALLERGIES:   Allergies  Allergen Reactions  . Prednisone Nausea And Vomiting, Nausea Only and Other (See Comments)  . Chlorphen-Pe-Acetaminophen Other (See Comments)    Reaction: unknown    DISCHARGE MEDICATIONS:   Allergies as of 10/10/2018      Reactions   Prednisone Nausea And Vomiting, Nausea Only, Other (See Comments)   Chlorphen-pe-acetaminophen Other (See Comments)   Reaction: unknown      Medication List    STOP taking these medications   donepezil 5 MG tablet Commonly known as:  ARICEPT     TAKE these medications   aspirin 81 MG EC tablet Take 1 tablet (81 mg total) by mouth daily. Start taking on:  Oct 11, 2018   divalproex 125 MG capsule Commonly known as:  DEPAKOTE SPRINKLE Take 1 capsule (125 mg total) by mouth every 12 (twelve) hours.   feeding supplement (ENSURE ENLIVE) Liqd Take 237 mLs by mouth 2 (two) times daily between meals.   mirtazapine 15 MG tablet Commonly known as:  REMERON Take 2 tablets (30 mg total) by mouth at bedtime. What changed:  how much to take   QUEtiapine 25 MG tablet Commonly known as:  SEROQUEL Take 0.5  tablets (12.5 mg total) by mouth 2 (two) times daily with breakfast and lunch.   QUEtiapine 50 MG tablet Commonly known as:  SEROQUEL Take 1 tablet (50 mg total) by mouth at bedtime.   Vitamin D 50 MCG (2000 UT) Caps Take 1 capsule (2,000 Units total) by mouth daily.       If you experience worsening of your admission symptoms, develop  shortness of breath, life threatening emergency, suicidal or homicidal thoughts you must seek medical attention immediately by calling 911 or calling your MD immediately  if symptoms less severe.  You Must read complete instructions/literature along with all the possible adverse reactions/side effects for all the Medicines you take and that have been prescribed to you. Take any new Medicines after you have completely understood and accept all the possible adverse reactions/side effects.   Please note  You were cared for by a hospitalist during your hospital stay. If you have any questions about your discharge medications or the care you received while you were in the hospital after you are discharged, you can call the unit and asked to speak with the hospitalist on call if the hospitalist that took care of you is not available. Once you are discharged, your primary care physician will handle any further medical issues. Please note that NO REFILLS for any discharge medications will be authorized once you are discharged, as it is imperative that you return to your primary care physician (or establish a relationship with a primary care physician if you do not have one) for your aftercare needs so that they can reassess your need for medications and monitor your lab values.  DATA REVIEW:   CBC  No results for input(s): WBC, HGB, HCT, PLT in the last 168 hours.  Chemistries  Recent Labs  Lab 10/03/18 1951 10/05/18 1241  NA  --  139  K  --  3.5  CL  --  110  CO2  --  22  GLUCOSE  --  106*  BUN  --  18  CREATININE  --  0.98  CALCIUM  --  8.4*  MG 2.0  --     Microbiology Results   Recent Results (from the past 240 hour(s))  SARS Coronavirus 2 (CEPHEID - Performed in [facility] hospital lab), Hosp Order     Status: None   Collection Time: 10/01/18  1:29 PM  Result Value Ref Range Status   SARS Coronavirus 2 NEGATIVE NEGATIVE Final    Comment: (NOTE) If result is NEGATIVE SARS-CoV-2  target nucleic acids are NOT DETECTED. The SARS-CoV-2 RNA is generally detectable in upper and lower  respiratory specimens during the acute phase of infection. The lowest  concentration of SARS-CoV-2 viral copies this assay can detect is 250  copies / mL. A negative result does not preclude SARS-CoV-2 infection  and should not be used as the sole basis for treatment or other  patient management decisions.  A negative result may occur with  improper specimen collection / handling, submission of specimen other  than nasopharyngeal swab, presence of viral mutation(s) within the  areas targeted by this assay, and inadequate number of viral copies  (<250 copies / mL). A negative result must be combined with clinical  observations, patient history, and epidemiological information. If result is POSITIVE SARS-CoV-2 target nucleic acids are DETECTED. The SARS-CoV-2 RNA is generally detectable in upper and lower  respiratory specimens dur ing the acute phase of infection.  Positive  results are indicative of active  infection with SARS-CoV-2.  Clinical  correlation with patient history and other diagnostic information is  necessary to determine patient infection status.  Positive results do  not rule out bacterial infection or co-infection with other viruses. If result is PRESUMPTIVE POSTIVE SARS-CoV-2 nucleic acids MAY BE PRESENT.   A presumptive positive result was obtained on the submitted specimen  and confirmed on repeat testing.  While 2019 novel coronavirus  (SARS-CoV-2) nucleic acids may be present in the submitted sample  additional confirmatory testing may be necessary for epidemiological  and / or clinical management purposes  to differentiate between  SARS-CoV-2 and other Sarbecovirus currently known to infect humans.  If clinically indicated additional testing with an alternate test  methodology [phone]) is advised. The SARS-CoV-2 RNA is generally  detectable in upper and lower  respiratory sp ecimens during the acute  phase of infection. The expected result is Negative. Fact Sheet for Patients:  BoilerBrush.com.cy Fact Sheet for Healthcare Providers: https://pope.com/ This test is not yet approved or cleared by the [place] FDA and has been authorized for detection and/or diagnosis of SARS-CoV-2 by FDA under an Emergency Use Authorization (EUA).  This EUA will remain in effect (meaning this test can be used) for the duration of the COVID-19 declaration under Section 564(b)(1) of the Act, 21 U.S.C. section 360bbb-3(b)(1), unless the authorization is terminated or revoked sooner. Performed at [facility], [address] [place], [place] [address]   Urine Culture     Status: Abnormal   Collection Time: 10/01/18  5:02 PM  Result Value Ref Range Status   Specimen Description   Final    URINE, RANDOM Performed at [facility], [address] [place], [place] [address]    Special Requests   Final    NONE Performed at [facility], [address] [place], [place] [address]    Culture >=100,000 COLONIES/mL PROTEUS MIRABILIS (A)  Final   Report Status 10/03/2018 FINAL  Final   Organism ID, Bacteria PROTEUS MIRABILIS (A)  Final      Susceptibility   Proteus mirabilis - MIC*    AMPICILLIN <=2 SENSITIVE Sensitive     CEFAZOLIN <=4 SENSITIVE Sensitive     CEFTRIAXONE <=1 SENSITIVE Sensitive     CIPROFLOXACIN <=0.25 SENSITIVE Sensitive     GENTAMICIN <=1 SENSITIVE Sensitive     IMIPENEM 2 SENSITIVE Sensitive     NITROFURANTOIN 128 RESISTANT Resistant     TRIMETH/SULFA <=20 SENSITIVE Sensitive     AMPICILLIN/SULBACTAM <=2 SENSITIVE Sensitive     PIP/TAZO <=4 SENSITIVE Sensitive     * >=100,000 COLONIES/mL PROTEUS MIRABILIS    RADIOLOGY:  No results found.   CODE STATUS:     Code Status Orders  (From admission, onward)         Start     Ordered   10/06/18 1238  Do  not attempt resuscitation (DNR)  Continuous    Question Answer Comment  In the event of cardiac or respiratory ARREST Do not call a "code blue"   In the event of cardiac or respiratory ARREST Do not perform Intubation, CPR, defibrillation or ACLS   In the event of cardiac or respiratory ARREST Use medication by any route, position, wound care, and other measures to relive pain and suffering. May use oxygen, suction and manual treatment of airway obstruction as needed for comfort.      10/06/18 1237        Code Status History    Date Active Date  Inactive Code Status Order ID Comments User Context   10/01/2018 1702 10/06/2018 1237 Partial Code [id number]  Campbell StallMayo, Katy Dodd, MD ED   09/25/2015 2034 09/26/2015 1728 Full Code [id number]  Clapacs, Jackquline DenmarkJohn T, MD Inpatient    Advance Directive Documentation     Most Recent Value  Type of Advance Directive  Healthcare Power of Attorney  Pre-existing out of facility DNR order (yellow form or pink MOST form)  -  "MOST" Form in Place?  -      TOTAL TIME TAKING CARE OF THIS PATIENT: *40* minutes.    Enedina FinnerSona Deliah Strehlow M.D on 10/10/2018 at 3:49 PM  Between 7am to 6pm - Pager - [phone] After 6pm go to [email or web address] - Social research officer, governmentpassword [email or web address]  [facility] [place] [facility]  Office  [phone]  CC: Primary care physician; Reubin MilanBerglund, Laura H, MD

## 2018-10-10 NOTE — Progress Notes (Signed)
[facility] [place] at [facility]   PATIENT NAME: Deborah [name]    MR#:  [id number]  DATE OF BIRTH:  11-17-1945  SUBJECTIVE:  Resting quietly Paranoia, gets upset when I tried to enter the room  REVIEW OF SYSTEMS:  Review of system unobtainable from baseline dementia  DRUG ALLERGIES:   Allergies  Allergen Reactions  . Prednisone Nausea And Vomiting, Nausea Only and Other (See Comments)  . Chlorphen-Pe-Acetaminophen Other (See Comments)    Reaction: unknown    VITALS:  Blood pressure (!) 142/74, pulse 64, temperature 98.5 F (36.9 C), temperature source Oral, resp. rate 16, height 5\' 3"  (1.6 m), weight 46.6 kg, SpO2 98 %.  PHYSICAL EXAMINATION:  Limited physical exam as patient is not cooperative  GENERAL:  73 y.o.-year-old patient lying in the bed with no acute distress. Thin, cachectic EYES: Pupils equal, round, reactive to light and accommodation. No scleral icterus.  NECK:  Supple, no jugular venous distention. LUNGS:.  Normal respiratory effort no use of accessory muscles of respiration.  CARDIOVASCULAR: S1, S2 normal. No murmurs, rubs, or gallops.  NEUROLOGIC: Awake and alert. Gait not checked. Moves all extremities well. Limited exam since pt not cooperative PSYCHIATRIC: patient is alert and disoriented with  episodes of agitation baseline severe dementia  LABORATORY PANEL:   CBC No results for input(s): WBC, HGB, HCT, PLT in the last 168 hours. ------------------------------------------------------------------------------------------------------------------  Chemistries  Recent Labs  Lab 10/03/18 1951 10/05/18 1241  NA  --  139  K  --  3.5  CL  --  110  CO2  --  22  GLUCOSE  --  106*  BUN  --  18  CREATININE  --  0.98  CALCIUM  --  8.4*  MG 2.0  --    ------------------------------------------------------------------------------------------------------------------  Cardiac Enzymes No results for input(s): TROPONINI in the  last 168 hours. ------------------------------------------------------------------------------------------------------------------  RADIOLOGY:  No results found.  EKG:   Orders placed or performed during the hospital encounter of 10/01/18  . ED EKG  . ED EKG  . EKG 12-Lead  . EKG 12-Lead    ASSESSMENT AND PLAN:   #Delirium with acute psychosis, underlying dementia with aggressive behavior with UTI -Urine cultures with Proteus mirabilis pansensitive except Macrobid -completed course of keflex -TSH normal  -Psychiatry consult noted cont Remeron and Seroquel, PRN Zyprexa -Not appropriate for inpt psych transfer -NOT COOPERATIVE for working with PT even after -trying with dter in the room  #Subacute or chronic stroke-ruled out MRI, MRA of the brain negative Patient was not cooperating with echocardiogram Seen by neurology and treated the recommendations PT could not assess patient as patient is unable to follow commands at this time LDL 102  #Chronic history of dementia at the intermittent aggressive behavior discontinued Aricept by Dr Viviano Simas Remeron dose increased to 30 mg as recommended by neurology  #Chronic kidney disease stage III creatinine close to baseline Avoid nephrotoxins and monitor renal function closely  #Disposition family is requesting skilled nursing facility-- patient is not rehab appropriate given her severe dementia with aggressive behavior.  Seen by palliative care  CSW to assist with d/c planning.   CODE STATUS: DNR TOTAL TIME TAKING CARE OF THIS PATIENT: 25  minutes.   POSSIBLE D/C IN ??  DAYS, DEPENDING ON CLINICAL CONDITION.  Note: This dictation was prepared with Dragon dictation along with smaller phrase technology. Any transcriptional errors that result from this process are unintentional.   Enedina Finner M.D on 10/10/2018 at 8:16 AM  Between 7am to 6pm - Pager - [phone] After 6pm go to [email or web address] - password [email or web address] [facility]  [facility]  Office  [phone]  CC: Primary care physician; Reubin MilanBerglund, Laura H, MD

## 2018-10-10 NOTE — TOC Transition Note (Signed)
Transition of Care [facility]) - CM/SW Discharge Note   Patient Details  Name: PREETHI LIGHTFOOT MRN: [id number] Date of Birth: [birth year]  Transition of Care [facility]) CM/SW Contact:  Ruthe Mannan, LCSWA Phone Number: 10/10/2018, 3:45 PM   Clinical Narrative: Patient is medically ready for discharge today. Patient will discharge home with daughter Chales Abrahams. CSW spoke with daughter regarding discharge. Chales Abrahams states that she is prepared and ready to take patient home today. Daughter will transport home.       Final next level of care: Home/Self Care Barriers to Discharge: No Barriers Identified   Patient Goals and CMS Choice   CMS Medicare.gov Compare Post Acute Care list provided to:: Patient Represenative (must comment)(Daughter- Corrie Dandy An ) Choice offered to / list presented to : Adult Children  Discharge Placement                  Name of family member notified: Chales Abrahams  Patient and family notified of of transfer: 10/10/18  Discharge Plan and Services     Post Acute Care Choice: Skilled Nursing Facility                               Social Determinants of Health (SDOH) Interventions     Readmission Risk Interventions No flowsheet data found.

## 2018-10-10 NOTE — Care Management Important Message (Signed)
Important Message  Patient Details  Name: Deborah [name] MRN: [id number] Date of Birth: 07/01/1945   Medicare Important Message Given:  Yes  [name] returned my call and I reviewed the Important Message from Medicare with her for her mother.  While she was hoping she would qualify for a Skilled Nursing placement she was in agreement with the discharge plan of going home with Home Health services. She thanked me for calling.    [name] A Kesley Gaffey 10/10/2018, 3:32 PM

## 2018-10-10 NOTE — Plan of Care (Signed)
  Problem: Education: Goal: Knowledge of General Education information will improve Description: Including pain rating scale, medication(s)/side effects and non-pharmacologic comfort measures Outcome: Progressing   Problem: Health Behavior/Discharge Planning: Goal: Ability to manage health-related needs will improve Outcome: Progressing   Problem: Clinical Measurements: Goal: Ability to maintain clinical measurements within normal limits will improve Outcome: Progressing Goal: Will remain free from infection Outcome: Progressing Goal: Diagnostic test results will improve Outcome: Progressing   Problem: Activity: Goal: Risk for activity intolerance will decrease Outcome: Progressing   Problem: Coping: Goal: Level of anxiety will decrease Outcome: Progressing   Problem: Pain Managment: Goal: General experience of comfort will improve Outcome: Progressing   Problem: Safety: Goal: Ability to remain free from injury will improve Outcome: Progressing   Problem: Skin Integrity: Goal: Risk for impaired skin integrity will decrease Outcome: Progressing   

## 2018-10-10 NOTE — Care Management Important Message (Signed)
Important Message  Patient Details  Name: Deborah [name] MRN: [id number] Date of Birth: 26-Jul-1945   Medicare Important Message Given:  Other (see comment)  Left a message for Ms. Thwaites's daughter, [name] ([phone]) to call me at her convenience so I could review the Important Message from Medicare with her as she is the guardian/contact person that the social workers have been talking with about her discharge planning. Will await a return call.   [name] A Dontario Evetts 10/10/2018, 3:09 PM

## 2018-10-11 ENCOUNTER — Telehealth: Payer: Self-pay

## 2018-10-11 NOTE — Telephone Encounter (Signed)
Attempted to reach patient for TCM call and to confirm hospital follow up appt. Left msg to contact office at [phone].

## 2018-10-23 ENCOUNTER — Encounter: Payer: Self-pay | Admitting: Emergency Medicine

## 2018-10-23 ENCOUNTER — Encounter: Payer: Self-pay | Admitting: Internal Medicine

## 2018-10-23 ENCOUNTER — Other Ambulatory Visit: Payer: Self-pay

## 2018-10-23 ENCOUNTER — Emergency Department
Admission: EM | Admit: 2018-10-23 | Discharge: 2018-10-26 | Disposition: A | Discharge status: Other Acute Inpatient Hospital | Payer: Medicare HMO | Attending: Emergency Medicine | Admitting: Emergency Medicine

## 2018-10-23 ENCOUNTER — Ambulatory Visit (INDEPENDENT_AMBULATORY_CARE_PROVIDER_SITE_OTHER): Payer: Medicare HMO | Admitting: Internal Medicine

## 2018-10-23 VITALS — BP 122/76 | HR 86 | Ht 63.0 in | Wt 108.0 lb

## 2018-10-23 DIAGNOSIS — F319 Bipolar disorder, unspecified: Secondary | ICD-10-CM | POA: Diagnosis not present

## 2018-10-23 DIAGNOSIS — R451 Restlessness and agitation: Secondary | ICD-10-CM | POA: Diagnosis present

## 2018-10-23 DIAGNOSIS — J45909 Unspecified asthma, uncomplicated: Secondary | ICD-10-CM | POA: Diagnosis not present

## 2018-10-23 DIAGNOSIS — N183 Chronic kidney disease, stage 3 unspecified: Secondary | ICD-10-CM

## 2018-10-23 DIAGNOSIS — Z23 Encounter for immunization: Secondary | ICD-10-CM

## 2018-10-23 DIAGNOSIS — J449 Chronic obstructive pulmonary disease, unspecified: Secondary | ICD-10-CM | POA: Insufficient documentation

## 2018-10-23 DIAGNOSIS — Z022 Encounter for examination for admission to residential institution: Secondary | ICD-10-CM | POA: Diagnosis not present

## 2018-10-23 DIAGNOSIS — F17210 Nicotine dependence, cigarettes, uncomplicated: Secondary | ICD-10-CM | POA: Diagnosis not present

## 2018-10-23 DIAGNOSIS — Z7982 Long term (current) use of aspirin: Secondary | ICD-10-CM | POA: Insufficient documentation

## 2018-10-23 DIAGNOSIS — F0390 Unspecified dementia without behavioral disturbance: Secondary | ICD-10-CM | POA: Insufficient documentation

## 2018-10-23 DIAGNOSIS — F419 Anxiety disorder, unspecified: Secondary | ICD-10-CM | POA: Diagnosis present

## 2018-10-23 DIAGNOSIS — R456 Violent behavior: Secondary | ICD-10-CM | POA: Diagnosis not present

## 2018-10-23 DIAGNOSIS — Z20828 Contact with and (suspected) exposure to other viral communicable diseases: Secondary | ICD-10-CM | POA: Insufficient documentation

## 2018-10-23 DIAGNOSIS — G301 Alzheimer's disease with late onset: Secondary | ICD-10-CM

## 2018-10-23 DIAGNOSIS — G309 Alzheimer's disease, unspecified: Secondary | ICD-10-CM | POA: Insufficient documentation

## 2018-10-23 DIAGNOSIS — F0281 Dementia in other diseases classified elsewhere with behavioral disturbance: Secondary | ICD-10-CM

## 2018-10-23 DIAGNOSIS — R4689 Other symptoms and signs involving appearance and behavior: Secondary | ICD-10-CM

## 2018-10-23 DIAGNOSIS — Z8709 Personal history of other diseases of the respiratory system: Secondary | ICD-10-CM | POA: Diagnosis not present

## 2018-10-23 DIAGNOSIS — Z79899 Other long term (current) drug therapy: Secondary | ICD-10-CM | POA: Insufficient documentation

## 2018-10-23 DIAGNOSIS — Z9049 Acquired absence of other specified parts of digestive tract: Secondary | ICD-10-CM | POA: Diagnosis not present

## 2018-10-23 DIAGNOSIS — Z8673 Personal history of transient ischemic attack (TIA), and cerebral infarction without residual deficits: Secondary | ICD-10-CM | POA: Diagnosis not present

## 2018-10-23 DIAGNOSIS — F332 Major depressive disorder, recurrent severe without psychotic features: Secondary | ICD-10-CM | POA: Diagnosis not present

## 2018-10-23 DIAGNOSIS — R600 Localized edema: Secondary | ICD-10-CM

## 2018-10-23 DIAGNOSIS — F03918 Unspecified dementia, unspecified severity, with other behavioral disturbance: Secondary | ICD-10-CM

## 2018-10-23 DIAGNOSIS — I129 Hypertensive chronic kidney disease with stage 1 through stage 4 chronic kidney disease, or unspecified chronic kidney disease: Secondary | ICD-10-CM | POA: Diagnosis not present

## 2018-10-23 DIAGNOSIS — Z03818 Encounter for observation for suspected exposure to other biological agents ruled out: Secondary | ICD-10-CM | POA: Diagnosis not present

## 2018-10-23 DIAGNOSIS — F0391 Unspecified dementia with behavioral disturbance: Secondary | ICD-10-CM

## 2018-10-23 MED ORDER — QUETIAPINE FUMARATE 25 MG PO TABS: 12.5000 mg | ORAL_TABLET | Freq: Two times a day (BID) | ORAL | 2 refills | Status: DC

## 2018-10-23 NOTE — ED Triage Notes (Signed)
Patient ambulatory to triage with steady gait, without difficulty or distress noted, in custody of [place] [employer] for IVC--lives with daughter; pt denies SI or HI and st she doesn't know why she is here; pt st "just leave me alone"; pt alert to self only and is uncooperative in triage and refusing blood work and dressing out

## 2018-10-23 NOTE — ED Notes (Signed)
Changed pt out in room 22. Pt came with a black t shirt,, black pajama pants, 2 yellow and pink socks, 2 blue tennis shoes and a gold necklace.

## 2018-10-23 NOTE — ED Notes (Signed)
Pt. Transferred from Triage to room 22.  Pt. Oriented to Quad including Q15 minute rounds as well as Engineer, drilling for their protection. Patient is alert and oriented, warm and dry in no acute distress. Patient denies SI, HI, and AVH. States she doesn't understand why she's here Pt. Encouraged to let me know if needs arise.

## 2018-10-23 NOTE — Progress Notes (Signed)
Date:  10/23/2018   Name:  Deborah HughsBetty S [name]   DOB:  18-Dec-1945   MRN:  [id number]  Pt is here today with her daughter Deborah [name].  Patient's husband is in the hospital with metastatic cancer and he can no longer care for her.  Chief Complaint: Edema (Cold swollen feet. ) Hospital follow up.  [facility] through 10/10/18. ADMISSION DIAGNOSIS:  Stroke (HCC) [I63.9] Urinary tract infection without hematuria, site unspecified [N39.0] Altered mental status, unspecified altered mental status type [R41.82]  DISCHARGE DIAGNOSIS:  delirium with acute psychosis/altered mental status/progressive decline due to dementia UTI completed antibiotic  Dementia - pt is now staying with her daughter but she can not stay there.  She is taking Remeron and Seroquel without difficulty but daughter has not noticed any improvement.  She is trying to get her placed - SW has given her the name of [facility] care home. Patient does sleep well over-night for about 8 hours.  UTI - completed course of antibiotics.  Edema - daughter has noticed her mother has swollen feet and they feel cool.  Patient denies pain.  She is able to walk unassisted at home. She does have her legs down most of the day.   Review of Systems  Constitutional: Positive for appetite change (eating better). Negative for fever.  HENT: Negative for trouble swallowing.   Respiratory: Negative for cough.   Cardiovascular: Positive for leg swelling.  Gastrointestinal: Negative for abdominal pain, constipation, diarrhea, nausea and vomiting.  Psychiatric/Behavioral: Positive for agitation and confusion. Negative for sleep disturbance.    Patient Active Problem List   Diagnosis Date Noted  . Goals of care, counseling/discussion   . Palliative care by specialist   . Acute delirium 10/03/2018  . Stroke (HCC) 10/01/2018  . Protein-calorie malnutrition, mild (HCC) 02/17/2018  . Vitamin D deficiency 06/20/2017  . Arthritis  06/17/2017  . Hx of peptic ulcer 06/17/2017  . IBS (irritable bowel syndrome) 06/17/2017  . Unintended weight loss 06/17/2017  . CKD (chronic kidney disease) stage 3, GFR 30-59 ml/min (HCC) 06/17/2017  . Balance problem 11/09/2016  . Late onset Alzheimer's disease with behavioral disturbance (HCC) 11/09/2016  . Macrocytic anemia 08/03/2016  . Hx of breast cancer 07/28/2016  . Folate deficiency 10/02/2015  . Bipolar 1 disorder, depressed (HCC) 09/25/2015  . Smoker 09/25/2015  . B12 deficiency 09/11/2015  . Anxiety 07/23/2015  . Severe episode of recurrent major depressive disorder (HCC) 07/03/2015  . History of migraine headaches 09/08/2014  . H/O Bell's palsy 09/08/2014  . H/O: HTN (hypertension) 09/08/2014  . Gastroesophageal reflux disease without esophagitis 09/08/2014  . COPD (chronic obstructive pulmonary disease) (HCC) 09/08/2014  . History of asthma 09/08/2014  . Breast neoplasm, Tis (LCIS) 01/23/2013    Allergies  Allergen Reactions  . Prednisone Nausea And Vomiting, Nausea Only and Other (See Comments)  . Chlorphen-Pe-Acetaminophen Other (See Comments)    Reaction: unknown    Past Surgical History:  Procedure Laterality Date  . arm surgery Left 2011   fracture repair  . BREAST BIOPSY     LCIS  . CHOLECYSTECTOMY    . COLONOSCOPY  2002  . ELBOW SURGERY    . SHOULDER SURGERY    . SPINE SURGERY    . throat biopsy   2012  . UPPER GI ENDOSCOPY  2011    Social History   Tobacco Use  . Smoking status: Current Every Day Smoker    Packs/day: 1.00  Years: 20.00    Pack years: 20.00    Types: Cigarettes    Start date: 03/31/1977  . Smokeless tobacco: Never Used  Substance Use Topics  . Alcohol use: No    Alcohol/week: 0.0 standard drinks    Comment: Ocassional  . Drug use: No     Medication list has been reviewed and updated.  Current Meds  Medication Sig  . aspirin EC 81 MG EC tablet Take 1 tablet (81 mg total) by mouth daily.  . divalproex  (DEPAKOTE SPRINKLE) 125 MG capsule Take 1 capsule (125 mg total) by mouth every 12 (twelve) hours.  . feeding supplement, ENSURE ENLIVE, (ENSURE ENLIVE) LIQD Take 237 mLs by mouth 2 (two) times daily between meals.  . mirtazapine (REMERON) 15 MG tablet Take 2 tablets (30 mg total) by mouth at bedtime.  Marland Kitchen QUEtiapine (SEROQUEL) 25 MG tablet Take 0.5 tablets (12.5 mg total) by mouth 2 (two) times daily with breakfast and lunch.  . QUEtiapine (SEROQUEL) 50 MG tablet Take 1 tablet (50 mg total) by mouth at bedtime.    PHQ 2/9 Scores 08/21/2018 02/17/2018 08/18/2017 07/21/2017  PHQ - 2 Score - 0 - 0  PHQ- 9 Score - 0 - 7  Exception Documentation Medical reason - Medical reason Medical reason  Not completed - - mental issues -    BP Readings from Last 3 Encounters:  10/23/18 122/76  10/10/18 (!) 142/74  08/21/18 126/81    Physical Exam Constitutional:      Appearance: She is underweight.  Cardiovascular:     Rate and Rhythm: Normal rate and regular rhythm.     Heart sounds: No murmur.     Comments: Trace Edema both feet, non tender, normal pulses No skin changes, left foot slightly cool.  Capillary refill normal. Pulmonary:     Effort: Pulmonary effort is normal.     Breath sounds: Normal breath sounds. No wheezing or rhonchi.  Neurological:     Mental Status: She is alert.  Psychiatric:        Attention and Perception: Perception normal.        Mood and Affect: Affect is angry.        Speech: Speech normal.        Behavior: Behavior is aggressive.        Cognition and Memory: Cognition is impaired. Memory is impaired.     Wt Readings from Last 3 Encounters:  10/23/18 108 lb (49 kg)  10/01/18 102 lb 12.8 oz (46.6 kg)  08/21/18 102 lb (46.3 kg)    BP 122/76   Pulse 86   Ht 5\' 3"  (1.6 m)   Wt 108 lb (49 kg)   SpO2 97%   BMI 19.13 kg/m   Assessment and Plan: 1. Late onset Alzheimer's disease with behavioral disturbance ([place]) Parking application given Continue Seroquil and  Depakote FL-2 form will be completed.  2. Localized edema Recommend elevation, light compression stockings during the day  3. Need for tuberculosis vaccination Placed today; return in 48-72 hours - TB Skin Test  4. CKD (chronic kidney disease) stage 3, GFR 30-59 ml/min (HCC) stable  5. History of stroke No focal deficit  6. Severe episode of recurrent major depressive disorder, without psychotic features ([place]) Continue Remeron   Partially dictated using Editor, commissioning. Any errors are unintentional.  Halina Maidens, MD [facility]  10/23/2018

## 2018-10-24 LAB — VALPROIC ACID LEVEL: Valproic Acid Lvl: 13 ug/mL — ABNORMAL LOW (ref 50.0–100.0)

## 2018-10-24 LAB — COMPREHENSIVE METABOLIC PANEL
ALT: 27 U/L (ref 0–44)
AST: 24 U/L (ref 15–41)
Albumin: 3.6 g/dL (ref 3.5–5.0)
Alkaline Phosphatase: 69 U/L (ref 38–126)
Anion gap: 8 (ref 5–15)
BUN: 25 mg/dL — ABNORMAL HIGH (ref 8–23)
CO2: 25 mmol/L (ref 22–32)
Calcium: 8.8 mg/dL — ABNORMAL LOW (ref 8.9–10.3)
Chloride: 109 mmol/L (ref 98–111)
Creatinine, Ser: 1.18 mg/dL — ABNORMAL HIGH (ref 0.44–1.00)
GFR calc Af Amer: 53 mL/min — ABNORMAL LOW (ref >60)
GFR calc non Af Amer: 46 mL/min — ABNORMAL LOW (ref >60)
Glucose, Bld: 96 mg/dL (ref 70–99)
Potassium: 5.3 mmol/L — ABNORMAL HIGH (ref 3.5–5.1)
Sodium: 142 mmol/L (ref 135–145)
Total Bilirubin: 0.6 mg/dL (ref 0.3–1.2)
Total Protein: 6.9 g/dL (ref 6.5–8.1)

## 2018-10-24 LAB — CBC WITH DIFFERENTIAL/PLATELET
Abs Immature Granulocytes: 0.07 10*3/uL (ref 0.00–0.07)
Basophils Absolute: 0.1 10*3/uL (ref 0.0–0.1)
Basophils Relative: 1 %
Eosinophils Absolute: 0.2 10*3/uL (ref 0.0–0.5)
Eosinophils Relative: 2 %
HCT: 37.9 % (ref 36.0–46.0)
Hemoglobin: 12.3 g/dL (ref 12.0–15.0)
Immature Granulocytes: 1 %
Lymphocytes Relative: 28 %
Lymphs Abs: 2.4 10*3/uL (ref 0.7–4.0)
MCH: 32.5 pg (ref 26.0–34.0)
MCHC: 32.5 g/dL (ref 30.0–36.0)
MCV: 100 fL (ref 80.0–100.0)
Monocytes Absolute: 0.9 10*3/uL (ref 0.1–1.0)
Monocytes Relative: 10 %
Neutro Abs: 5 10*3/uL (ref 1.7–7.7)
Neutrophils Relative %: 58 %
Platelets: 196 10*3/uL (ref 150–400)
RBC: 3.79 MIL/uL — ABNORMAL LOW (ref 3.87–5.11)
RDW: 15.8 % — ABNORMAL HIGH (ref 11.5–15.5)
WBC: 8.6 10*3/uL (ref 4.0–10.5)
nRBC: 0 % (ref 0.0–0.2)

## 2018-10-24 LAB — ETHANOL: Alcohol, Ethyl (B): <10 mg/dL (ref <10)

## 2018-10-24 LAB — SALICYLATE LEVEL: Salicylate Lvl: <7 mg/dL (ref 2.8–30.0)

## 2018-10-24 LAB — ACETAMINOPHEN LEVEL: Acetaminophen (Tylenol), Serum: <10 ug/mL — ABNORMAL LOW (ref 10–30)

## 2018-10-24 MED ORDER — LORAZEPAM 1 MG PO TABS
1.0000 mg | ORAL_TABLET | Freq: Once | ORAL | Status: AC
  Administered 2018-10-24: 1 mg via ORAL
  Filled 2018-10-24: qty 1

## 2018-10-24 MED ORDER — ASPIRIN EC 81 MG PO TBEC
81.0000 mg | DELAYED_RELEASE_TABLET | Freq: Every day | ORAL | Status: DC
  Administered 2018-10-26: 09:00:00 81 mg via ORAL
  Filled 2018-10-24: qty 1

## 2018-10-24 MED ORDER — DIVALPROEX SODIUM 125 MG PO CSDR
125.0000 mg | DELAYED_RELEASE_CAPSULE | Freq: Two times a day (BID) | ORAL | Status: DC
  Administered 2018-10-24: 125 mg via ORAL
  Filled 2018-10-24 (×4): qty 1

## 2018-10-24 MED ORDER — MIRTAZAPINE 15 MG PO TABS
30.0000 mg | ORAL_TABLET | Freq: Every day | ORAL | Status: DC
  Administered 2018-10-24: 30 mg via ORAL
  Filled 2018-10-24: qty 2

## 2018-10-24 MED ORDER — QUETIAPINE FUMARATE 25 MG PO TABS
50.0000 mg | ORAL_TABLET | Freq: Every day | ORAL | Status: DC
  Administered 2018-10-24: 50 mg via ORAL
  Filled 2018-10-24: qty 2

## 2018-10-24 MED ORDER — ENSURE ENLIVE PO LIQD
237.0000 mL | Freq: Two times a day (BID) | ORAL | Status: DC
  Administered 2018-10-26 (×2): 237 mL via ORAL

## 2018-10-24 MED ORDER — QUETIAPINE FUMARATE 25 MG PO TABS
12.5000 mg | ORAL_TABLET | Freq: Two times a day (BID) | ORAL | Status: DC
  Administered 2018-10-24 – 2018-10-25 (×2): 12.5 mg via ORAL
  Filled 2018-10-24 (×2): qty 1

## 2018-10-24 NOTE — ED Notes (Signed)
Hourly rounding reveals patient in room. No complaints, stable, in no acute distress. Q15 minute rounds and monitoring via Rover and Officer to continue.   

## 2018-10-24 NOTE — ED Notes (Signed)
Pt was changed and bed linen was changed. Pt refused covid19 swab.

## 2018-10-24 NOTE — ED Notes (Signed)
Hourly rounding reveals patient sleeping in room. No complaints, stable, in no acute distress. Q15 minute rounds and monitoring via Rover and Officer to continue.  

## 2018-10-24 NOTE — BH Assessment (Signed)
Referral information for Psychiatric Hospitalization faxed to;   [facility] ([phone]),    [facility] ([phone] -or- [phone]) [phone]   [facility] ([phone]),   [facility] ([phone], [phone], [phone] or [phone]),    [facility] ([phone]),    [facility] ([phone]),    [facility] ([phone])   [facility] ([phone]),    [facility] ([phone]),    [facility] ([phone] or [phone])   [facility] ([phone] or [phone]),    [facility] ([phone]) 

## 2018-10-24 NOTE — ED Notes (Signed)
Changed pt and her bed linen and scrubs.

## 2018-10-24 NOTE — BH Assessment (Signed)
Currently under review with [facility]

## 2018-10-24 NOTE — ED Notes (Signed)
Report to include Situation, Background, Assessment, and Recommendations received from Amy RN. Patient alert, warm and dry, in no acute distress. Patient denies SI, HI, AVH and pain. Patient made aware of Q15 minute rounds and Engineer, drilling presence for their safety. Patient instructed to come to me with needs or concerns.

## 2018-10-24 NOTE — ED Notes (Signed)
Pt given PRN PO Ativan and scheduled Seroquel. Pt agitated. Maintained on 15 minute checks.

## 2018-10-24 NOTE — ED Notes (Signed)
Referral information for Psychiatric Hospitalization faxed to;   [facility] [phone]),   . [facility] (-[phone] -or- [phone]) [phone]  . [facility] ([phone]),  . [facility] [phone], [phone], [phone] or [phone]),   . [facility] [phone] or [phone])  . [place] [phone] or [phone]),

## 2018-10-24 NOTE — ED Notes (Signed)
Pt continues to refuse medications and lab work, cussing at staff. EDP to be made aware. Maintained on 15 minute checks.

## 2018-10-24 NOTE — ED Notes (Signed)
Pt was given the phone and called her daughter at 3:15pm.
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
## 2018-10-24 NOTE — ED Notes (Signed)
Attempted but patient refusing Corona swab.

## 2018-10-24 NOTE — ED Notes (Signed)
Pt wanting to leave and asking for her clothing. Pt refusing medications and telling staff to go to hell.  Lab work and COVID swab cannot be done at this time. Maintained on 15 minute checks.

## 2018-10-24 NOTE — ED Notes (Signed)
IVC, PENDING CONSULT 

## 2018-10-24 NOTE — Consult Note (Signed)
[facility] Face-to-Face Psych[facility]ry Consult   Reason for Consult: Aggressive behavior Referring Physician: Dr. York CeriseForbach Patient Identification: Deborah [name] MRN:  [id number] Principal Diagnosis: <principal problem not specified> Diagnosis:  Active Problems:   Dementia (HCC)   Total Time spent with patient: 45 minutes  Subjective: " I do not know." "  What?" Deborah HughsBetty S [name] is a 73 y.o. female patient with an extensive history of bipolar disorder, dementia, anxiety, and other medical problems who was brought to the ED via law enforcement under involuntary commitment status (IVC).  The patient was seen for worsening combative behavior at home.  IVC paper work stated the patient has been exhibiting wandering behaviors and allegedly threaten to kill her daughter. Some of these behaviors has been going on for the last several months, but has been getting progressively worse.  From previous assessment the patient daughter discussed that her mom behavior continues to get worse. this is unusual for her.  At baseline, patient is very sedentary but can walk on her own.  She lives at home with her husband.  Upon evaluation, patient is in bed not very responsive to questions presented to her.  She is awake and alert, however oriented to self only.  The patient does not appear to be responding to internal or external stimuli. She is the presenting with some delusional thinking. The patient is not able to answer questions such as if she is experiencing auditory or visual hallucinations ;suicidal, homicidal, or self-harm ideations. The patient is not presenting with any psychotic or paranoid behaviors during her assessment. During an encounter with the patient, she was unable to appropriately participate and the assessment questions that were posed to her. Collateral was not obtained due to the time of the patient assessment.  Patient's daughter, Deborah [name] ([phone])  Plan: The patient is a safety risk to  self and others.  She is currently requiring geriatrics  psychiatric inpatient admission for stabilization and treatment.   HPI:    Past Psychiatric History:  Depression Bipolar 1 disorder (HCC) Bipolar 1 disorder, mixed, severe (HCC) Anxiety  Risk to Self:  Yes Risk to Others:  Yes Prior Inpatient Therapy:  Yes Prior Outpatient Therapy:  Yes  Past Medical History:  Past Medical History:  Diagnosis Date  . Anxiety   . Arthritis   . Asthma   . Bacteriuria 07/31/2016  . Bipolar 1 disorder (HCC)   . Bipolar 1 disorder, mixed, severe (HCC) 07/26/2016  . Cystitis   . Dementia (HCC)   . Depression   . Heart murmur   . IBS (irritable bowel syndrome)   . Osteoporosis   . Spastic colon   . Swallowing problem 11/09/2016  . Thrombocytopenia (HCC) 08/03/2016  . Ulcer     Past Surgical History:  Procedure Laterality Date  . arm surgery Left 2011   fracture repair  . BREAST BIOPSY     LCIS  . CHOLECYSTECTOMY    . COLONOSCOPY  2002  . ELBOW SURGERY    . SHOULDER SURGERY    . SPINE SURGERY    . throat biopsy   2012  . UPPER GI ENDOSCOPY  2011   Family History:  Family History  Problem Relation Age of Onset  . Heart attack Mother   . Cancer Sister        ovarian  . Cancer Maternal Aunt        breast  . Cancer Maternal Aunt        breast   Family Psychiatric  History:  Unknown except for substance abuse and one daughter with mental disability Social History:  Social History   Substance and Sexual Activity  Alcohol Use No  . Alcohol/week: 0.0 standard drinks   Comment: Ocassional     Social History   Substance and Sexual Activity  Drug Use No    Social History   Socioeconomic History  . Marital status: Married    Spouse name: Not on file  . Number of children: Not on file  . Years of education: Not on file  . Highest education level: Not on file  Occupational History  . Not on file  Social Needs  . Financial resource strain: Not on file  . Food  insecurity:    Worry: Not on file    Inability: Not on file  . Transportation needs:    Medical: Not on file    Non-medical: Not on file  Tobacco Use  . Smoking status: Current Every Day Smoker    Packs/day: 1.00    Years: 20.00    Pack years: 20.00    Types: Cigarettes    Start date: 03/31/1977  . Smokeless tobacco: Never Used  Substance and Sexual Activity  . Alcohol use: No    Alcohol/week: 0.0 standard drinks    Comment: Ocassional  . Drug use: No  . Sexual activity: Not Currently  Lifestyle  . Physical activity:    Days per week: Not on file    Minutes per session: Not on file  . Stress: Not on file  Relationships  . Social connections:    Talks on phone: Not on file    Gets together: Not on file    Attends religious service: Not on file    Active member of club or organization: Not on file    Attends meetings of clubs or organizations: Not on file    Relationship status: Not on file  Other Topics Concern  . Not on file  Social History Narrative  . Not on file   Additional Social History:    Allergies:   Allergies  Allergen Reactions  . Prednisone Nausea And Vomiting, Nausea Only and Other (See Comments)  . Chlorphen-Pe-Acetaminophen Other (See Comments)    Reaction: unknown    Labs: No results found for this or any previous visit (from the past 48 hour(s)).  No current facility-administered medications for this encounter.    Current Outpatient Medications  Medication Sig Dispense Refill  . aspirin EC 81 MG EC tablet Take 1 tablet (81 mg total) by mouth daily. 30 tablet 0  . divalproex (DEPAKOTE SPRINKLE) 125 MG capsule Take 1 capsule (125 mg total) by mouth every 12 (twelve) hours. 60 capsule 0  . feeding supplement, ENSURE ENLIVE, (ENSURE ENLIVE) LIQD Take 237 mLs by mouth 2 (two) times daily between meals. 237 mL 12  . mirtazapine (REMERON) 15 MG tablet Take 2 tablets (30 mg total) by mouth at bedtime. 60 tablet 0  . QUEtiapine (SEROQUEL) 25 MG  tablet Take 0.5 tablets (12.5 mg total) by mouth 2 (two) times daily with breakfast and lunch. 60 tablet 2  . QUEtiapine (SEROQUEL) 50 MG tablet Take 1 tablet (50 mg total) by mouth at bedtime. 30 tablet 0    Musculoskeletal: Strength & Muscle Tone: decreased Gait & Station: unsteady Patient leans: Backward  Psychiatric Specialty Exam: Physical Exam  Nursing note and vitals reviewed. Constitutional: She appears well-developed and well-nourished.  HENT:  Head: Normocephalic and atraumatic.  Eyes: Pupils are equal, round,  and reactive to light. Conjunctivae are normal.  Neck: Normal range of motion. Neck supple.  Cardiovascular: Normal rate and regular rhythm.  Respiratory: Effort normal.  Musculoskeletal: Normal range of motion.  Neurological: She is alert.  Skin: Skin is warm and dry.    Review of Systems  Psychiatric/Behavioral: Positive for depression and memory loss. The patient is nervous/anxious.   All other systems reviewed and are negative.   Blood pressure (!) 170/80, pulse 96, temperature 97.9 F (36.6 C), temperature source Oral, resp. rate 20, height 5\' 3"  (1.6 m), weight 49 kg, SpO2 95 %.Body mass index is 19.14 kg/m.  General Appearance: Fairly Groomed  Eye Contact:  Minimal  Speech:  Pressured  Volume:  Increased  Mood:  Anxious and Depressed  Affect:  Blunt, Depressed, Inappropriate and Restricted  Thought Process:  Disorganized  Orientation:  Other:  Self  Thought Content:  Illogical and Delusions  Suicidal Thoughts:  Unable to answer  Homicidal Thoughts:  Unable to answer  Memory:  Immediate;   Poor Recent;   Poor  Judgement:  Impaired  Insight:  Lacking  Psychomotor Activity:  Decreased  Concentration:  Concentration: Poor and Attention Span: Poor  Recall:  Poor  Fund of Knowledge:  Poor  Language:  Poor  Akathisia:  NA  Handed:  Right  AIMS (if indicated):     Assets:  Desire for Improvement Social Support  ADL's:  Impaired  Cognition:   Impaired,  Mild  Sleep:   Okay     Treatment Plan Summary: Daily contact with patient to assess and evaluate symptoms and progress in treatment, Medication management and Plan Patient does meet criteria for geriatric psychiatric inpatient admission once a bed becomes available.  Disposition: Supportive therapy provided about ongoing stressors. Patient does meet criteria for a geriatric psychiatric inpatient admission once a bed becomes available.  Lamont Dowdy, NP 10/24/2018 6:03 AM

## 2018-10-25 ENCOUNTER — Emergency Department: Payer: Medicare HMO

## 2018-10-25 ENCOUNTER — Ambulatory Visit: Payer: Medicare HMO

## 2018-10-25 DIAGNOSIS — Z022 Encounter for examination for admission to residential institution: Secondary | ICD-10-CM | POA: Diagnosis not present

## 2018-10-25 DIAGNOSIS — F319 Bipolar disorder, unspecified: Secondary | ICD-10-CM | POA: Diagnosis not present

## 2018-10-25 DIAGNOSIS — G309 Alzheimer's disease, unspecified: Secondary | ICD-10-CM

## 2018-10-25 DIAGNOSIS — F419 Anxiety disorder, unspecified: Secondary | ICD-10-CM | POA: Diagnosis not present

## 2018-10-25 DIAGNOSIS — F0281 Dementia in other diseases classified elsewhere with behavioral disturbance: Secondary | ICD-10-CM | POA: Diagnosis not present

## 2018-10-25 DIAGNOSIS — Z8709 Personal history of other diseases of the respiratory system: Secondary | ICD-10-CM | POA: Diagnosis not present

## 2018-10-25 LAB — NOVEL CORONAVIRUS, NAA (HOSP ORDER, SEND-OUT TO REF LAB; TAT 18-24 HRS): SARS-CoV-2, NAA: NOT DETECTED

## 2018-10-25 MED ORDER — DIVALPROEX SODIUM 125 MG PO CSDR
250.0000 mg | DELAYED_RELEASE_CAPSULE | Freq: Two times a day (BID) | ORAL | Status: DC
  Administered 2018-10-26: 09:00:00 250 mg via ORAL
  Filled 2018-10-25 (×2): qty 2

## 2018-10-25 MED ORDER — OLANZAPINE 5 MG PO TBDP
20.0000 mg | ORAL_TABLET | Freq: Once | ORAL | Status: AC
  Administered 2018-10-25: 20 mg via ORAL
  Filled 2018-10-25: qty 4

## 2018-10-25 MED ORDER — OLANZAPINE 5 MG PO TBDP
2.5000 mg | ORAL_TABLET | Freq: Two times a day (BID) | ORAL | Status: DC
  Administered 2018-10-26: 2.5 mg via ORAL
  Filled 2018-10-25: qty 1

## 2018-10-25 MED ORDER — MIRTAZAPINE 15 MG PO TBDP
30.0000 mg | ORAL_TABLET | Freq: Every day | ORAL | Status: DC
  Administered 2018-10-25: 30 mg via ORAL
  Filled 2018-10-25 (×2): qty 2

## 2018-10-25 MED ORDER — OLANZAPINE 5 MG PO TBDP
10.0000 mg | ORAL_TABLET | Freq: Every day | ORAL | Status: DC
  Administered 2018-10-25: 10 mg via ORAL
  Filled 2018-10-25: qty 2

## 2018-10-25 NOTE — ED Notes (Signed)
Hourly rounding reveals patient sleeping in room. No complaints, stable, in no acute distress. Q15 minute rounds and monitoring via Rover and Officer to continue.  

## 2018-10-25 NOTE — ED Notes (Signed)
TB skin test to right forearm was negative.

## 2018-10-25 NOTE — Consult Note (Signed)
[facility]   Reason for Consult: Aggressive behavior Referring Physician: Dr. York CeriseForbach Patient Identification: Deborah HughsBetty S [name] MRN:  [id number] Principal Diagnosis: Dementia [facility](HCC) Diagnosis:  Principal Problem:   Dementia (HCC) Active Problems:   Anxiety   Bipolar 1 disorder, depressed (HCC)  Total Time spent with patient: 35 min  Subjective: "Get away from me bitch!  Don't touch me! I want to go home."  Per initial psychiatric intake:  HPI:   Deborah HughsBetty S Tortora is a 73 y.o. female patient with an extensive history of bipolar disorder, dementia, anxiety, and other medical problems who was brought to the ED via law enforcement under involuntary commitment status (IVC).  The patient was seen for worsening combative behavior at home.  IVC paper work stated the patient has been exhibiting wandering behaviors and allegedly threaten to kill her daughter. Some of these behaviors has been going on for the last several months, but has been getting progressively worse.  From previous assessment the patient daughter discussed that her mom behavior continues to get worse. this is unusual for her.  At baseline, patient is very sedentary but can walk on her own.  She lives at home with her husband. Upon evaluation, patient is in bed not very responsive to questions presented to her.  She is awake and alert, however oriented to self only.  The patient does not appear to be responding to internal or external stimuli. She is the presenting with some delusional thinking. The patient is not able to answer questions such as if she is experiencing auditory or visual hallucinations ;suicidal, homicidal, or self-harm ideations. The patient is not presenting with any psychotic or paranoid behaviors during her assessment. During an encounter with the patient, she was unable to appropriately participate and the assessment questions that were posed to her. Collateral was not obtained due to the time of  the patient assessment.  Patient's daughter, Deborah [name] ([phone]) Plan: The patient is a safety risk to self and others.  She is currently requiring geriatrics  psychiatric inpatient admission for stabilization and treatment.   On evaluation this morning, patient is agitated.  She is intermittently compliant with her medications.  Patient is verbally aggressive and cursing.  She holds that her hand to avoid this Clinical research associatewriter approaching her.  She states she does not want to be touched.  Patient is reassured and patient is able to calm briefly.  She states she will take her medications if she can go home.  She states she lives at [address] with her husband Deborah [name].  She is aware her daughter is named LobbyistMarianne.  She is not oriented to date or time.  Patient denies SI.  She remains paranoid at time of assessment.  Past Psychiatric History:  Depression Bipolar 1 disorder (HCC) Bipolar 1 disorder, mixed, severe (HCC) Anxiety  Risk to Self:  Yes Risk to Others:  Yes Prior Inpatient Therapy:  Yes Prior Outpatient Therapy:  Yes  Past Medical History:  Past Medical History:  Diagnosis Date  . Anxiety   . Arthritis   . Asthma   . Bacteriuria 07/31/2016  . Bipolar 1 disorder (HCC)   . Bipolar 1 disorder, mixed, severe (HCC) 07/26/2016  . Cystitis   . Dementia (HCC)   . Depression   . Heart murmur   . IBS (irritable bowel syndrome)   . Osteoporosis   . Spastic colon   . Swallowing problem 11/09/2016  . Thrombocytopenia (HCC) 08/03/2016  . Ulcer  Past Surgical History:  Procedure Laterality Date  . arm surgery Left 2011   fracture repair  . BREAST BIOPSY     LCIS  . CHOLECYSTECTOMY    . COLONOSCOPY  2002  . ELBOW SURGERY    . SHOULDER SURGERY    . SPINE SURGERY    . throat biopsy   2012  . UPPER GI ENDOSCOPY  2011   Family History:  Family History  Problem Relation Age of Onset  . Heart attack Mother   . Cancer Sister        ovarian  . Cancer Maternal Aunt         breast  . Cancer Maternal Aunt        breast   Family Psychiatric  History:  Unknown except for substance abuse and one daughter with mental disability  Social History:  Social History   Substance and Sexual Activity  Alcohol Use No  . Alcohol/week: 0.0 standard drinks   Comment: Ocassional     Social History   Substance and Sexual Activity  Drug Use No    Social History   Socioeconomic History  . Marital status: Married    Spouse name: Not on file  . Number of children: Not on file  . Years of education: Not on file  . Highest education level: Not on file  Occupational History  . Not on file  Social Needs  . Financial resource strain: Not on file  . Food insecurity:    Worry: Not on file    Inability: Not on file  . Transportation needs:    Medical: Not on file    Non-medical: Not on file  Tobacco Use  . Smoking status: Current Every Day Smoker    Packs/day: 1.00    Years: 20.00    Pack years: 20.00    Types: Cigarettes    Start date: 03/31/1977  . Smokeless tobacco: Never Used  Substance and Sexual Activity  . Alcohol use: No    Alcohol/week: 0.0 standard drinks    Comment: Ocassional  . Drug use: No  . Sexual activity: Not Currently  Lifestyle  . Physical activity:    Days per week: Not on file    Minutes per session: Not on file  . Stress: Not on file  Relationships  . Social connections:    Talks on phone: Not on file    Gets together: Not on file    Attends religious service: Not on file    Active member of club or organization: Not on file    Attends meetings of clubs or organizations: Not on file    Relationship status: Not on file  Other Topics Concern  . Not on file  Social History Narrative  . Not on file   Additional Social History:   Lives with husband Deborah [name], she does have a daughter that is active in her care.   Allergies:   Allergies  Allergen Reactions  . Prednisone Nausea And Vomiting, Nausea Only and Other (See Comments)   . Chlorphen-Pe-Acetaminophen Other (See Comments)    Reaction: unknown    Labs:  Results for orders placed or performed during the hospital encounter of 10/23/18 (from the past 48 hour(s))  Novel Coronavirus, NAA (hospital order; send-out to ref lab)     Status: None   Collection Time: 10/24/18 12:44 AM  Result Value Ref Range   SARS-CoV-2, NAA NOT DETECTED NOT DETECTED    Comment: (NOTE) This test was developed and its  performance characteristics determined by [employer]. This test has not been FDA cleared or approved. This test has been authorized by FDA under an Emergency Use Authorization (EUA). This test is only authorized for the duration of time the declaration that circumstances exist justifying the authorization of the emergency use of in vitro diagnostic tests for detection of SARS-CoV-2 virus and/or diagnosis of COVID-19 infection under section 564(b)(1) of the Act, 21 U.S.C. 161WRU-0(A)(5), unless the authorization is terminated or revoked sooner. When diagnostic testing is negative, the possibility of a false negative result should be considered in the context of a patient's recent exposures and the presence of clinical signs and symptoms consistent with COVID-19. An individual without symptoms of COVID-19 and who is not shedding SARS-CoV-2 virus would expect to have a negative (not detected) result in this assay. Performed  At: [facility] [address] [place], [place] [id number] Jolene Schimke MD NW:[id number]    Coronavirus Source NASOPHARYNGEAL     Comment: Performed at [facility], [address] [place], [place] [address]  CBC with Differential/Platelet     Status: Abnormal   Collection Time: 10/24/18 12:44 AM  Result Value Ref Range   WBC 8.6 4.0 - 10.5 K/uL   RBC 3.79 (L) 3.87 - 5.11 MIL/uL   Hemoglobin 12.3 12.0 - 15.0 g/dL   HCT 96.2 95.2 - 84.1 %   MCV 100.0 80.0 - 100.0 fL   MCH 32.5 26.0 - 34.0 pg   MCHC 32.5 30.0  - 36.0 g/dL   RDW 32.4 (H) 40.1 - 02.7 %   Platelets 196 150 - 400 K/uL   nRBC 0.0 0.0 - 0.2 %   Neutrophils Relative % 58 %   Neutro Abs 5.0 1.7 - 7.7 K/uL   Lymphocytes Relative 28 %   Lymphs Abs 2.4 0.7 - 4.0 K/uL   Monocytes Relative 10 %   Monocytes Absolute 0.9 0.1 - 1.0 K/uL   Eosinophils Relative 2 %   Eosinophils Absolute 0.2 0.0 - 0.5 K/uL   Basophils Relative 1 %   Basophils Absolute 0.1 0.0 - 0.1 K/uL   Immature Granulocytes 1 %   Abs Immature Granulocytes 0.07 0.00 - 0.07 K/uL    Comment: Performed at [facility], [address] [place], [place] [address]  Comprehensive metabolic panel     Status: Abnormal   Collection Time: 10/24/18 12:44 AM  Result Value Ref Range   Sodium 142 135 - 145 mmol/L   Potassium 5.3 (H) 3.5 - 5.1 mmol/L   Chloride 109 98 - 111 mmol/L   CO2 25 22 - 32 mmol/L   Glucose, Bld 96 70 - 99 mg/dL   BUN 25 (H) 8 - 23 mg/dL   Creatinine, Ser 4.40 (H) 0.44 - 1.00 mg/dL   Calcium 8.8 (L) 8.9 - 10.3 mg/dL   Total Protein 6.9 6.5 - 8.1 g/dL   Albumin 3.6 3.5 - 5.0 g/dL   AST 24 15 - 41 U/L   ALT 27 0 - 44 U/L   Alkaline Phosphatase 69 38 - 126 U/L   Total Bilirubin 0.6 0.3 - 1.2 mg/dL   GFR calc non Af Amer 46 (L) >60 mL/min   GFR calc Af Amer 53 (L) >60 mL/min   Anion gap 8 5 - 15    Comment: Performed at [facility], [address], [place], [place] [address]  Acetaminophen level     Status: Abnormal   Collection Time: 10/24/18 12:44 AM  Result Value  Ref Range   Acetaminophen (Tylenol), Serum <10 (L) 10 - 30 ug/mL    Comment: (NOTE) Therapeutic concentrations vary significantly. A range of 10-30 ug/mL  may be an effective concentration for many patients. However, some  are best treated at concentrations outside of this range. Acetaminophen concentrations >150 ug/mL at 4 hours after ingestion  and >50 ug/mL at 12 hours after ingestion are often associated with  toxic reactions. Performed at [facility],  [address] [place], [place] [address]   Salicylate level     Status: None   Collection Time: 10/24/18 12:44 AM  Result Value Ref Range   Salicylate Lvl <7.0 2.8 - 30.0 mg/dL    Comment: Performed at [facility], [address] [place], [place] [address]  Ethanol     Status: None   Collection Time: 10/24/18 12:44 AM  Result Value Ref Range   Alcohol, Ethyl (B) <10 <10 mg/dL    Comment: (NOTE) Lowest detectable limit for serum alcohol is 10 mg/dL. For medical purposes only. Performed at [facility], [address] [place], [place] [address]   Valproic acid level     Status: Abnormal   Collection Time: 10/24/18  8:01 AM  Result Value Ref Range   Valproic Acid Lvl 13 (L) 50.0 - 100.0 ug/mL    Comment: Performed at [facility], [address] [place], [place] [address]    Current Facility-Administered Medications  Medication Dose Route Frequency Provider Last Rate Last Dose  . aspirin EC tablet 81 mg  81 mg Oral Daily Loleta RoseForbach, Cory, MD      . divalproex (DEPAKOTE SPRINKLE) capsule 125 mg  125 mg Oral Q12H Loleta RoseForbach, Cory, MD   125 mg at 10/24/18 2116  . feeding supplement (ENSURE ENLIVE) (ENSURE ENLIVE) liquid 237 mL  237 mL Oral BID BM Loleta RoseForbach, Cory, MD   Stopped at 10/24/18 1811  . mirtazapine (REMERON) tablet 30 mg  30 mg Oral QHS Loleta RoseForbach, Cory, MD   30 mg at 10/24/18 2116  . QUEtiapine (SEROQUEL) tablet 12.5 mg  12.5 mg Oral BID WC Loleta RoseForbach, Cory, MD   12.5 mg at 10/25/18 1215  . QUEtiapine (SEROQUEL) tablet 50 mg  50 mg Oral QHS Loleta RoseForbach, Cory, MD   50 mg at 10/24/18 2116   Current Outpatient Medications  Medication Sig Dispense Refill  . aspirin EC 81 MG EC tablet Take 1 tablet (81 mg total) by mouth daily. 30 tablet 0  . divalproex (DEPAKOTE SPRINKLE) 125 MG capsule Take 1 capsule (125 mg total) by mouth every 12 (twelve) hours. 60 capsule 0  . feeding supplement, ENSURE ENLIVE, (ENSURE ENLIVE) LIQD Take 237 mLs by mouth 2 (two) times  daily between meals. 237 mL 12  . mirtazapine (REMERON) 15 MG tablet Take 2 tablets (30 mg total) by mouth at bedtime. 60 tablet 0  . QUEtiapine (SEROQUEL) 25 MG tablet Take 0.5 tablets (12.5 mg total) by mouth 2 (two) times daily with breakfast and lunch. (Patient taking differently: Take 25 mg by mouth 2 (two) times daily with breakfast and lunch. ) 60 tablet 2  . QUEtiapine (SEROQUEL) 50 MG tablet Take 1 tablet (50 mg total) by mouth at bedtime. 30 tablet 0    Musculoskeletal: Strength & Muscle Tone: decreased Gait & Station: unsteady Patient leans: Backward  Psychiatric Specialty Exam: Physical Exam  Nursing note and vitals reviewed. Constitutional: She appears well-developed and well-nourished. She appears distressed.  HENT:  Head: Normocephalic and atraumatic.  Eyes: EOM  are normal.  Neck: Normal range of motion.  Cardiovascular: Normal rate and regular rhythm.  Respiratory: Effort normal. No respiratory distress.  Musculoskeletal: Normal range of motion.  Neurological: She is alert.  Psychiatric: Her mood appears anxious. Her affect is angry, labile and inappropriate. Her speech is rapid and/or pressured and tangential. She is agitated and aggressive. Thought content is paranoid. Cognition and memory are impaired. She expresses impulsivity and inappropriate judgment. She exhibits a depressed mood. She expresses no homicidal and no suicidal ideation.    Review of Systems  Unable to perform ROS: Psychiatric disorder  Psychiatric/Behavioral: Positive for depression and memory loss. Negative for suicidal ideas. The patient is nervous/anxious.     Blood pressure 122/60, pulse 87, temperature 97.7 F (36.5 C), temperature source Oral, resp. rate 20, height 5\' 3"  (1.6 m), weight 49 kg, SpO2 97 %.Body mass index is 19.14 kg/m.  General Appearance: Disheveled  Eye Contact:  Minimal  Speech:  Pressured  Volume:  Increased  Mood:  Angry, Anxious, Depressed and Irritable  Affect:   Constricted, Depressed and Inappropriate  Thought Process:  Disorganized  Orientation:  Other:  Self  Thought Content:  Illogical and Delusions  Suicidal Thoughts:  No  Homicidal Thoughts:  Unable to answer  Memory:  Immediate;   Poor Recent;   Poor  Judgement:  Impaired  Insight:  Lacking  Psychomotor Activity:  Restlessness  Concentration:  Concentration: Poor and Attention Span: Poor  Recall:  Poor  Fund of Knowledge:  Poor  Language:  Poor  Akathisia:  NA  Handed:  Right  AIMS (if indicated):     Assets:  Housing Social Support  ADL's:  Impaired  Cognition:  Impaired,  Severe  Sleep:   Okay     Treatment Plan Summary: Daily contact with patient to assess and evaluate symptoms and progress in treatment, Medication management and Plan Patient does meet criteria for geriatric psychiatric inpatient admission once a bed becomes available.  Zyprexa Zydis 20 mg has been provided 1 time for patient's severe agitation and aggression midday to ensure patient safety. Due to patient noncompliance with medication will change scheduled medication to an orally disintegrating tablet. Discontinue Seroquel 50 mg at bedtime as well as Seroquel 12.5 mg's twice daily with breakfast and lunch. Schedule Zyprexa Zydis 10 mg at bedtime Schedule Zyprexa Zydis 2.5 mg at breakfast and lunch. Change mirtazapine to Remeron SolTab 30 mg at bedtime. Depakote level on arrival was 13, low Will increase Depakote sprinkles to 250 mg twice daily with meals (sprinkle contents of capsule onto food). Repeat Depakote level on 10/30/2018.  Disposition: Supportive therapy provided about ongoing stressors. Patient does meet criteria for a geriatric psychiatric inpatient admission once a bed becomes available.  Lavella Hammock, MD 10/25/2018 7:19 PM

## 2018-10-25 NOTE — ED Notes (Signed)
Daughter, Onnie Boer, called to check on her mother.  Requests that psychiatrist calls her tomorrow if at all possible.

## 2018-10-25 NOTE — ED Notes (Signed)
Patient in room crying went in to check on patient , patient states I don't need anything from you "bitch' notified nurse Abigail Butts.

## 2018-10-25 NOTE — ED Provider Notes (Signed)
-----------------------------------------   5:12 AM on 10/25/2018 -----------------------------------------   Blood pressure (!) 156/61, pulse 91, temperature 97.7 F (36.5 C), temperature source Oral, resp. rate 16, height 1.6 m (5\' 3" ), weight 49 kg, SpO2 98 %.  The patient is calm and cooperative at this time.  There have been no acute events since the last update.  Awaiting disposition plan from Behavioral Medicine team.   Hinda Kehr, MD 10/25/18 [phone]

## 2018-10-25 NOTE — ED Notes (Signed)
Nurse attempted to give medications and talk with Patient and she started yelling" get out' no I am not taking anything" leave me alone" patient angry and uncooperative. Nurse will continue to monitor.

## 2018-10-25 NOTE — ED Notes (Addendum)
Pt agitated because she can not leave d/t being IVC. Informed pt that she could leave when MD releases her. Pt then goes on to say "go to hell bitch."

## 2018-10-25 NOTE — ED Notes (Signed)
Pt given meal tray.

## 2018-10-25 NOTE — ED Notes (Signed)
Pt observed resting in room, with regular/even/unlabored respirations. Will continue to monitor for needs/safety.

## 2018-10-25 NOTE — BH Assessment (Signed)
[facility] called and requested the following:  - EKG  - Vitals (within last 24 hours)  - Labs  - Chest X-Ray  - COVID-19 test results  - IVC paperwork  - [employer] number

## 2018-10-25 NOTE — ED Notes (Addendum)
Spoke to Patient's daughter on the phone, she ask if we could read TB skin test that was done at Doctors office, reading is due today, and also to tell her Velta Addison (daughter) wants her to take her medications, she states sometimes her mom will cooperate when you say her name, she states to call her if any changes [phone]

## 2018-10-25 NOTE — ED Notes (Signed)
Pt incontinent of urine and stool. Pt cleaned and bed lined changed. Pt laying in bed comfortably at this time.

## 2018-10-26 DIAGNOSIS — Y92239 Unspecified place in hospital as the place of occurrence of the external cause: Secondary | ICD-10-CM | POA: Diagnosis not present

## 2018-10-26 DIAGNOSIS — N189 Chronic kidney disease, unspecified: Secondary | ICD-10-CM | POA: Diagnosis not present

## 2018-10-26 DIAGNOSIS — F319 Bipolar disorder, unspecified: Secondary | ICD-10-CM | POA: Diagnosis not present

## 2018-10-26 DIAGNOSIS — N390 Urinary tract infection, site not specified: Secondary | ICD-10-CM | POA: Diagnosis not present

## 2018-10-26 DIAGNOSIS — K219 Gastro-esophageal reflux disease without esophagitis: Secondary | ICD-10-CM | POA: Diagnosis not present

## 2018-10-26 DIAGNOSIS — J45909 Unspecified asthma, uncomplicated: Secondary | ICD-10-CM | POA: Diagnosis not present

## 2018-10-26 DIAGNOSIS — J449 Chronic obstructive pulmonary disease, unspecified: Secondary | ICD-10-CM | POA: Diagnosis not present

## 2018-10-26 DIAGNOSIS — Z66 Do not resuscitate: Secondary | ICD-10-CM | POA: Diagnosis not present

## 2018-10-26 DIAGNOSIS — R8281 Pyuria: Secondary | ICD-10-CM | POA: Diagnosis not present

## 2018-10-26 DIAGNOSIS — F17210 Nicotine dependence, cigarettes, uncomplicated: Secondary | ICD-10-CM | POA: Diagnosis not present

## 2018-10-26 DIAGNOSIS — K5900 Constipation, unspecified: Secondary | ICD-10-CM | POA: Diagnosis not present

## 2018-10-26 DIAGNOSIS — N183 Chronic kidney disease, stage 3 (moderate): Secondary | ICD-10-CM | POA: Diagnosis not present

## 2018-10-26 DIAGNOSIS — N179 Acute kidney failure, unspecified: Secondary | ICD-10-CM | POA: Diagnosis not present

## 2018-10-26 DIAGNOSIS — G309 Alzheimer's disease, unspecified: Secondary | ICD-10-CM | POA: Diagnosis not present

## 2018-10-26 DIAGNOSIS — R195 Other fecal abnormalities: Secondary | ICD-10-CM | POA: Diagnosis not present

## 2018-10-26 DIAGNOSIS — W19XXXD Unspecified fall, subsequent encounter: Secondary | ICD-10-CM | POA: Diagnosis not present

## 2018-10-26 DIAGNOSIS — Z20828 Contact with and (suspected) exposure to other viral communicable diseases: Secondary | ICD-10-CM | POA: Diagnosis not present

## 2018-10-26 DIAGNOSIS — I129 Hypertensive chronic kidney disease with stage 1 through stage 4 chronic kidney disease, or unspecified chronic kidney disease: Secondary | ICD-10-CM | POA: Diagnosis not present

## 2018-10-26 DIAGNOSIS — R456 Violent behavior: Secondary | ICD-10-CM | POA: Diagnosis not present

## 2018-10-26 DIAGNOSIS — R8271 Bacteriuria: Secondary | ICD-10-CM | POA: Diagnosis not present

## 2018-10-26 DIAGNOSIS — F419 Anxiety disorder, unspecified: Secondary | ICD-10-CM | POA: Diagnosis not present

## 2018-10-26 DIAGNOSIS — W19XXXA Unspecified fall, initial encounter: Secondary | ICD-10-CM | POA: Diagnosis not present

## 2018-10-26 DIAGNOSIS — Z8673 Personal history of transient ischemic attack (TIA), and cerebral infarction without residual deficits: Secondary | ICD-10-CM | POA: Diagnosis not present

## 2018-10-26 DIAGNOSIS — I639 Cerebral infarction, unspecified: Secondary | ICD-10-CM | POA: Diagnosis not present

## 2018-10-26 DIAGNOSIS — Z853 Personal history of malignant neoplasm of breast: Secondary | ICD-10-CM | POA: Diagnosis not present

## 2018-10-26 DIAGNOSIS — F17200 Nicotine dependence, unspecified, uncomplicated: Secondary | ICD-10-CM | POA: Diagnosis not present

## 2018-10-26 DIAGNOSIS — T148XXA Other injury of unspecified body region, initial encounter: Secondary | ICD-10-CM | POA: Diagnosis not present

## 2018-10-26 DIAGNOSIS — G301 Alzheimer's disease with late onset: Secondary | ICD-10-CM | POA: Diagnosis not present

## 2018-10-26 DIAGNOSIS — B952 Enterococcus as the cause of diseases classified elsewhere: Secondary | ICD-10-CM | POA: Diagnosis not present

## 2018-10-26 DIAGNOSIS — F0281 Dementia in other diseases classified elsewhere with behavioral disturbance: Secondary | ICD-10-CM | POA: Diagnosis not present

## 2018-10-26 DIAGNOSIS — D539 Nutritional anemia, unspecified: Secondary | ICD-10-CM | POA: Diagnosis not present

## 2018-10-26 LAB — BASIC METABOLIC PANEL
Anion gap: 7 (ref 5–15)
BUN: 28 mg/dL — ABNORMAL HIGH (ref 8–23)
CO2: 25 mmol/L (ref 22–32)
Calcium: 8.8 mg/dL — ABNORMAL LOW (ref 8.9–10.3)
Chloride: 112 mmol/L — ABNORMAL HIGH (ref 98–111)
Creatinine, Ser: 1.19 mg/dL — ABNORMAL HIGH (ref 0.44–1.00)
GFR calc Af Amer: 53 mL/min — ABNORMAL LOW (ref >60)
GFR calc non Af Amer: 46 mL/min — ABNORMAL LOW (ref >60)
Glucose, Bld: 98 mg/dL (ref 70–99)
Potassium: 4.3 mmol/L (ref 3.5–5.1)
Sodium: 144 mmol/L (ref 135–145)
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
## 2018-10-26 NOTE — BH Assessment (Signed)
Patient has been accepted to [facility].  Accepting physician is Dr. Geanie Kenning.  Call report to [phone].  Representative was Lauree Chandler, Therapist, sports.   ER Staff is aware of it:  Glenda, ER Secretary  Dr. Kerman Passey, ER MD  Amy T., Patient's Nurse     Patient's Family/Support System Hennie Duos: [phone]) have been updated as well.     Address: [address] [place], [place] [address]

## 2018-10-26 NOTE — ED Notes (Signed)
Pt observed lying in bed - watching TV   Pt visualized with NAD  I introduced myself to her to her and provided her with an ensure  She stated  "Don't touch me - Don't touch me again  - bitch   Get out  Go to hell"   Offered to open the ensure  She began screaming  - cursing
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
## 2018-10-26 NOTE — ED Notes (Signed)

## 2018-10-26 NOTE — ED Notes (Signed)
BEHAVIORAL HEALTH ROUNDING Patient sleeping: No. Patient alert and oriented: yes Behavior appropriate: Yes.  ; If no, describe:  Nutrition and fluids offered: yes Toileting and hygiene offered: Yes  Sitter present: q15 minute observations and security  monitoring Law enforcement present: Yes  ODS  

## 2018-10-26 NOTE — ED Notes (Signed)
Pt softly asking  "Can you open this"  I entered and opened the ensure   She immediately began stating  "Get out bitch - Don't touch me "   Pt reassured and I left the room   She seems angry  - thanks you and curses you in the same breath

## 2018-10-26 NOTE — ED Notes (Signed)
ED BHU [place] Is the patient under IVC or is there intent for IVC: Yes.   Is the patient medically cleared: Yes.   Is there vacancy in the ED BHU: Yes.   Is the population mix appropriate for patient:  no  Is the patient awaiting placement in inpatient or outpatient setting: Yes.   Has the patient had a psychiatric consult: Yes.   Survey of unit performed for contraband, proper placement and condition of furniture, tampering with fixtures in bathroom, shower, and each patient room: Yes.  ; Findings:  APPEARANCE/BEHAVIOR Calm and cooperative NEURO ASSESSMENT Orientation: oriented to self  Denies pain Hallucinations: No.None noted (Hallucinations) Speech: Normal Gait: normal RESPIRATORY ASSESSMENT Even  Unlabored respirations  CARDIOVASCULAR ASSESSMENT Pulses equal   regular rate  Skin warm and dry   GASTROINTESTINAL ASSESSMENT no GI complaint EXTREMITIES Full ROM  PLAN OF CARE Provide calm/safe environment. Vital signs assessed twice daily. ED BHU Assessment once each 12-hour shift. Collaborate with TTS as condition indicates. Assure the ED provider has rounded once each shift. Provide and encourage hygiene. Provide redirection as needed. Assess for escalating behavior; address immediately and inform ED provider.  Assess family dynamic and appropriateness for visitation as needed: Yes.  ; If necessary, describe findings:  Educate the patient/family about BHU procedures/visitation: Yes.  ; If necessary, describe findings:

## 2018-11-19 ENCOUNTER — Emergency Department: Payer: Medicare HMO

## 2018-11-19 ENCOUNTER — Emergency Department
Admission: EM | Admit: 2018-11-19 | Discharge: 2018-11-19 | Disposition: A | Discharge status: Home / Self Care | Payer: Medicare HMO | Attending: Emergency Medicine | Admitting: Emergency Medicine

## 2018-11-19 ENCOUNTER — Encounter: Payer: Self-pay | Admitting: Emergency Medicine

## 2018-11-19 ENCOUNTER — Other Ambulatory Visit: Payer: Self-pay

## 2018-11-19 DIAGNOSIS — Z853 Personal history of malignant neoplasm of breast: Secondary | ICD-10-CM | POA: Diagnosis not present

## 2018-11-19 DIAGNOSIS — Z8673 Personal history of transient ischemic attack (TIA), and cerebral infarction without residual deficits: Secondary | ICD-10-CM | POA: Insufficient documentation

## 2018-11-19 DIAGNOSIS — R4182 Altered mental status, unspecified: Secondary | ICD-10-CM | POA: Diagnosis not present

## 2018-11-19 DIAGNOSIS — F17210 Nicotine dependence, cigarettes, uncomplicated: Secondary | ICD-10-CM | POA: Insufficient documentation

## 2018-11-19 DIAGNOSIS — N183 Chronic kidney disease, stage 3 (moderate): Secondary | ICD-10-CM | POA: Diagnosis not present

## 2018-11-19 DIAGNOSIS — F0391 Unspecified dementia with behavioral disturbance: Secondary | ICD-10-CM | POA: Diagnosis not present

## 2018-11-19 DIAGNOSIS — J45909 Unspecified asthma, uncomplicated: Secondary | ICD-10-CM | POA: Insufficient documentation

## 2018-11-19 DIAGNOSIS — Z7982 Long term (current) use of aspirin: Secondary | ICD-10-CM | POA: Insufficient documentation

## 2018-11-19 DIAGNOSIS — R451 Restlessness and agitation: Secondary | ICD-10-CM | POA: Diagnosis present

## 2018-11-19 DIAGNOSIS — J449 Chronic obstructive pulmonary disease, unspecified: Secondary | ICD-10-CM | POA: Diagnosis not present

## 2018-11-19 DIAGNOSIS — I129 Hypertensive chronic kidney disease with stage 1 through stage 4 chronic kidney disease, or unspecified chronic kidney disease: Secondary | ICD-10-CM | POA: Diagnosis not present

## 2018-11-19 DIAGNOSIS — G301 Alzheimer's disease with late onset: Secondary | ICD-10-CM | POA: Diagnosis not present

## 2018-11-19 DIAGNOSIS — N390 Urinary tract infection, site not specified: Secondary | ICD-10-CM | POA: Diagnosis not present

## 2018-11-19 DIAGNOSIS — Z79899 Other long term (current) drug therapy: Secondary | ICD-10-CM | POA: Insufficient documentation

## 2018-11-19 DIAGNOSIS — R29810 Facial weakness: Secondary | ICD-10-CM | POA: Diagnosis not present

## 2018-11-19 DIAGNOSIS — F03918 Unspecified dementia, unspecified severity, with other behavioral disturbance: Secondary | ICD-10-CM | POA: Diagnosis present

## 2018-11-19 LAB — PROTIME-INR
INR: 1 (ref 0.8–1.2)
Prothrombin Time: 13 seconds (ref 11.4–15.2)

## 2018-11-19 LAB — URINALYSIS, COMPLETE (UACMP) WITH MICROSCOPIC
Bilirubin Urine: NEGATIVE
Glucose, UA: NEGATIVE mg/dL
Ketones, ur: NEGATIVE mg/dL
Nitrite: NEGATIVE
Protein, ur: NEGATIVE mg/dL
Specific Gravity, Urine: 1.014 (ref 1.005–1.030)
pH: 7 (ref 5.0–8.0)

## 2018-11-19 LAB — CBC
HCT: 34.9 % — ABNORMAL LOW (ref 36.0–46.0)
Hemoglobin: 11.1 g/dL — ABNORMAL LOW (ref 12.0–15.0)
MCH: 32.6 pg (ref 26.0–34.0)
MCHC: 31.8 g/dL (ref 30.0–36.0)
MCV: 102.6 fL — ABNORMAL HIGH (ref 80.0–100.0)
Platelets: 150 10*3/uL (ref 150–400)
RBC: 3.4 MIL/uL — ABNORMAL LOW (ref 3.87–5.11)
RDW: 15.6 % — ABNORMAL HIGH (ref 11.5–15.5)
WBC: 10 10*3/uL (ref 4.0–10.5)
nRBC: 0 % (ref 0.0–0.2)

## 2018-11-19 LAB — COMPREHENSIVE METABOLIC PANEL
ALT: 11 U/L (ref 0–44)
AST: 17 U/L (ref 15–41)
Albumin: 3.5 g/dL (ref 3.5–5.0)
Alkaline Phosphatase: 65 U/L (ref 38–126)
Anion gap: 8 (ref 5–15)
BUN: 24 mg/dL — ABNORMAL HIGH (ref 8–23)
CO2: 25 mmol/L (ref 22–32)
Calcium: 8.3 mg/dL — ABNORMAL LOW (ref 8.9–10.3)
Chloride: 108 mmol/L (ref 98–111)
Creatinine, Ser: 1.18 mg/dL — ABNORMAL HIGH (ref 0.44–1.00)
GFR calc Af Amer: 53 mL/min — ABNORMAL LOW (ref >60)
GFR calc non Af Amer: 46 mL/min — ABNORMAL LOW (ref >60)
Glucose, Bld: 119 mg/dL — ABNORMAL HIGH (ref 70–99)
Potassium: 4.1 mmol/L (ref 3.5–5.1)
Sodium: 141 mmol/L (ref 135–145)
Total Bilirubin: 0.3 mg/dL (ref 0.3–1.2)
Total Protein: 6.5 g/dL (ref 6.5–8.1)

## 2018-11-19 MED ORDER — ATORVASTATIN CALCIUM 20 MG PO TABS: 20.0000 mg | ORAL_TABLET | Freq: Every day | ORAL | 6 refills | Status: DC

## 2018-11-19 MED ORDER — ASPIRIN EC 81 MG PO TBEC
81.0000 mg | DELAYED_RELEASE_TABLET | Freq: Once | ORAL | Status: AC
  Administered 2018-11-19: 15:00:00 81 mg via ORAL
  Filled 2018-11-19: qty 1

## 2018-11-19 MED ORDER — LORAZEPAM 1 MG PO TABS: 1.0000 mg | ORAL_TABLET | Freq: Every day | ORAL | Status: DC | PRN

## 2018-11-19 MED ORDER — CLOPIDOGREL BISULFATE 75 MG PO TABS
75.0000 mg | ORAL_TABLET | Freq: Once | ORAL | Status: AC
  Administered 2018-11-19: 75 mg via ORAL
  Filled 2018-11-19: qty 1

## 2018-11-19 MED ORDER — OLANZAPINE 5 MG PO TBDP
10.0000 mg | ORAL_TABLET | ORAL | Status: AC
  Administered 2018-11-19: 10 mg via ORAL
  Filled 2018-11-19: qty 2

## 2018-11-19 MED ORDER — LORAZEPAM 1 MG PO TABS: 1.0000 mg | ORAL_TABLET | Freq: Every day | ORAL | 0 refills | Status: DC | PRN

## 2018-11-19 MED ORDER — CLOPIDOGREL BISULFATE 75 MG PO TABS: 75.0000 mg | ORAL_TABLET | Freq: Once | ORAL | 6 refills | Status: DC

## 2018-11-19 MED ORDER — SODIUM CHLORIDE 0.9% FLUSH: 3.0000 mL | Freq: Once | INTRAVENOUS | Status: DC

## 2018-11-19 MED ORDER — DIVALPROEX SODIUM 125 MG PO CSDR
625.0000 mg | DELAYED_RELEASE_CAPSULE | Freq: Two times a day (BID) | ORAL | Status: DC
  Filled 2018-11-19 (×2): qty 5

## 2018-11-19 MED ORDER — ATORVASTATIN CALCIUM 20 MG PO TABS: 20.0000 mg | ORAL_TABLET | Freq: Every day | ORAL | Status: DC

## 2018-11-19 MED ORDER — FOSFOMYCIN TROMETHAMINE 3 G PO PACK
3.0000 g | PACK | ORAL | Status: AC
  Administered 2018-11-19: 3 g via ORAL
  Filled 2018-11-19: qty 3

## 2018-11-19 MED ORDER — DIVALPROEX SODIUM 125 MG PO CSDR: 625.0000 mg | DELAYED_RELEASE_CAPSULE | Freq: Two times a day (BID) | ORAL | 0 refills | Status: DC

## 2018-11-19 MED ORDER — LORAZEPAM 1 MG PO TABS
1.0000 mg | ORAL_TABLET | ORAL | Status: AC
  Administered 2018-11-19: 12:00:00 1 mg via ORAL
  Filled 2018-11-19: qty 1

## 2018-11-19 MED ORDER — ATORVASTATIN CALCIUM 20 MG PO TABS: 20.0000 mg | ORAL_TABLET | Freq: Every day | ORAL | 5 refills | Status: DC

## 2018-11-19 MED ORDER — CLOPIDOGREL BISULFATE 75 MG PO TABS: 75.0000 mg | ORAL_TABLET | Freq: Once | ORAL | 6 refills | Status: AC

## 2018-11-19 NOTE — ED Provider Notes (Signed)
[facility] Emergency Department Provider Note   ____________________________________________   First MD Initiated Contact with Patient 11/19/18 1153     (approximate)  I have reviewed the triage vital signs and the nursing notes.   HISTORY  Chief Complaint Altered Mental Status  EM caveat: Dementia  HPI Deborah [name] is a 73 y.o. female here for evaluation for agitation, also noted to have a slight look like she had drooping around her left mouth starting at 10:30 AM today  Recently came home from geriatric psych facility.  She been doing well last 2 days, and this morning seem to be very grumpy and agitated according to her daughter who is with her.  Been compliant with her medications.  Daughter reports has not seen a weakness except slight weakness in her face on the left, also drooling some today  Past Medical History:  Diagnosis Date   Anxiety    Arthritis    Asthma    Bacteriuria 07/31/2016   Bipolar 1 disorder (HCC)    Bipolar 1 disorder, mixed, severe ([place]) 07/26/2016   Cystitis    Dementia ([place])    Depression    Heart murmur    IBS (irritable bowel syndrome)    Osteoporosis    Spastic colon    Swallowing problem 11/09/2016   Thrombocytopenia ([place]) 08/03/2016   Ulcer     Patient Active Problem List   Diagnosis Date Noted   Goals of care, counseling/discussion    Palliative care by specialist    Acute delirium 10/03/2018   History of stroke 10/01/2018   Protein-calorie malnutrition, mild ([place]) 02/17/2018   Vitamin D deficiency 06/20/2017   Arthritis 06/17/2017   Hx of peptic ulcer 06/17/2017   IBS (irritable bowel syndrome) 06/17/2017   Unintended weight loss 06/17/2017   CKD (chronic kidney disease) stage 3, GFR 30-59 ml/min ([place]) 06/17/2017   Balance problem 11/09/2016   Late onset Alzheimer's disease with behavioral disturbance ([place]) 11/09/2016   Macrocytic anemia 08/03/2016   Hx of breast  cancer 07/28/2016   Folate deficiency 10/02/2015   Bipolar 1 disorder, depressed ([place]) 09/25/2015   Smoker 09/25/2015   B12 deficiency 09/11/2015   Anxiety 07/23/2015   Severe episode of recurrent major depressive disorder ([place]) 07/03/2015   Dementia with behavioral disturbance ([place]) 05/29/2015   History of migraine headaches 09/08/2014   H/O Bell's palsy 09/08/2014   H/O: HTN (hypertension) 09/08/2014   Gastroesophageal reflux disease without esophagitis 09/08/2014   COPD (chronic obstructive pulmonary disease) ([place]) 09/08/2014   History of asthma 09/08/2014   Breast neoplasm, Tis ([facility]) 01/23/2013    Past Surgical History:  Procedure Laterality Date   arm surgery Left 2011   fracture repair   BREAST BIOPSY     [facility]   CHOLECYSTECTOMY     COLONOSCOPY  2002   ELBOW SURGERY     SHOULDER SURGERY     SPINE SURGERY     throat biopsy   2012   UPPER GI ENDOSCOPY  2011    Prior to Admission medications   Medication Sig Start Date End Date Taking? Authorizing Provider  albuterol (VENTOLIN HFA) 108 (90 Base) MCG/ACT inhaler Inhale 2 puffs into the lungs every 4 (four) hours as needed for wheezing or shortness of breath.   Yes [provider]  aspirin EC 81 MG EC tablet Take 1 tablet (81 mg total) by mouth daily. 10/11/18  Yes Fritzi Mandes, MD  cholecalciferol (VITAMIN D3) 25 MCG (1000 UT) tablet Take 1,000  Units by mouth daily.   Yes [provider]  donepezil (ARICEPT) 5 MG tablet Take 5 mg by mouth at bedtime.   Yes [provider]  escitalopram (LEXAPRO) 5 MG tablet Take 5 mg by mouth daily.   Yes [provider]  folic acid (FOLVITE) 1 MG tablet Take 1 mg by mouth daily.   Yes [provider]  mirtazapine (REMERON) 15 MG tablet Take 2 tablets (30 mg total) by mouth at bedtime. Patient taking differently: Take 15 mg by mouth at bedtime.  10/10/18  Yes Enedina FinnerPatel, Sona, MD  OLANZapine (ZYPREXA) 10 MG tablet Take 5-10 mg by  mouth at bedtime. Takes 5 mg in the morning and 10 mg at bedtime   Yes [provider]  traZODone (DESYREL) 50 MG tablet Take 50 mg by mouth at bedtime as needed for sleep.   Yes [provider]  vitamin B-12 (CYANOCOBALAMIN) 1000 MCG tablet Take 1,000 mcg by mouth daily.   Yes [provider]  atorvastatin (LIPITOR) 20 MG tablet Take 1 tablet (20 mg total) by mouth daily at 6 PM. 11/19/18   Lord, Herminio HeadsJamison Y, NP  clopidogrel (PLAVIX) 75 MG tablet Take 1 tablet (75 mg total) by mouth once for 1 dose. 11/19/18 11/19/18  Charm RingsLord, Jamison Y, NP  divalproex (DEPAKOTE SPRINKLE) 125 MG capsule Take 5 capsules (625 mg total) by mouth every 12 (twelve) hours. 11/19/18   Charm RingsLord, Jamison Y, NP  LORazepam (ATIVAN) 1 MG tablet Take 1 tablet (1 mg total) by mouth daily as needed for anxiety. 11/19/18   Charm RingsLord, Jamison Y, NP    Allergies Prednisone and Chlorphen-pe-acetaminophen  Family History  Problem Relation Age of Onset   Heart attack Mother    Cancer Sister        ovarian   Cancer Maternal Aunt        breast   Cancer Maternal Aunt        breast    Social History Social History   Tobacco Use   Smoking status: Current Every Day Smoker    Packs/day: 1.00    Years: 20.00    Pack years: 20.00    Types: Cigarettes    Start date: 03/31/1977   Smokeless tobacco: Never Used  Substance Use Topics   Alcohol use: No    Alcohol/week: 0.0 standard drinks    Comment: Ocassional   Drug use: No    Review of Systems  EM caveat:  Daughter reports patient denies any fevers, she has been in fairly good health except for her dementia which seems to be progressively worsening.  She has been doing well last 2 days after returning from geriatric psych facility but this morning started to become more agitated.  No fevers or chills.  Daughter has not noticed her to be sick.  She has been eating and drinking not having any nausea or vomiting no  cough    ____________________________________________   PHYSICAL EXAM:  VITAL SIGNS: ED Triage Vitals  Enc Vitals Group     BP 11/19/18 1140 (!) 157/69     Pulse Rate 11/19/18 1140 92     Resp 11/19/18 1140 20     Temp 11/19/18 1140 98.4 F (36.9 C)     Temp Source 11/19/18 1140 Oral     SpO2 11/19/18 1140 97 %     Weight 11/19/18 1137 108 lb 0.4 oz (49 kg)     Height 11/19/18 1137 5\' 3"  (1.6 m)     Head  Circumference --      Peak Flow --      Pain Score --      Pain Loc --      Pain Edu? --      Excl. in GC? --     Constitutional: Alert and oriented to self and her daughter but not to place or situation. Well appearing and in no acute distress.  She gets agitated easily when attempted to examine her Eyes: Conjunctivae are normal. Head: Atraumatic. Nose: No congestion/rhinnorhea. Mouth/Throat: Mucous membranes are moist. Neck: No stridor.  Cardiovascular: Normal rate, regular rhythm. Grossly normal heart sounds.  Good peripheral circulation. Respiratory: Normal respiratory effort.  No retractions. Lungs CTAB. Gastrointestinal: Soft and nontender. No distention. Musculoskeletal: No lower extremity tenderness nor edema. Neurologic:  Normal speech and language. No gross focal neurologic deficits are appreciated except for maybe just the slightest weakness around the corner of the left mouth.  Skin:  Skin is warm, dry and intact. No rash noted. Psychiatric: Mood and affect are calm, but when examined and becomes upset with examiner.  She gets agitated and will scream occasionally swearing.  She is calm and and consoled by her daughter who she will asked to assist with examination  Passed bedside swallow ____________________________________________   LABS (all labs ordered are listed, but only abnormal results are displayed)  Labs Reviewed  COMPREHENSIVE METABOLIC PANEL - Abnormal; Notable for the following components:      Result Value   Glucose, Bld 119 (*)    BUN 24  (*)    Creatinine, Ser 1.18 (*)    Calcium 8.3 (*)    GFR calc non Af Amer 46 (*)    GFR calc Af Amer 53 (*)    All other components within normal limits  CBC - Abnormal; Notable for the following components:   RBC 3.40 (*)    Hemoglobin 11.1 (*)    HCT 34.9 (*)    MCV 102.6 (*)    RDW 15.6 (*)    All other components within normal limits  URINALYSIS, COMPLETE (UACMP) WITH MICROSCOPIC - Abnormal; Notable for the following components:   Color, Urine YELLOW (*)    APPearance CLEAR (*)    Hgb urine dipstick SMALL (*)    Leukocytes,Ua TRACE (*)    Bacteria, UA RARE (*)    All other components within normal limits  URINE CULTURE  PROTIME-INR  CBG MONITORING, ED   ____________________________________________  EKG   ____________________________________________  RADIOLOGY  Ct Head Wo Contrast  Result Date: 11/19/2018 CLINICAL DATA:  Acute presentation with altered mental status. Behavioral disturbance. Right-sided facial droop. EXAM: CT HEAD WITHOUT CONTRAST TECHNIQUE: Contiguous axial images were obtained from the base of the skull through the vertex without intravenous contrast. COMPARISON:  MRI 10/01/2018.  CT 10/01/2018 FINDINGS: Brain: No focal abnormality affects the brainstem. Cerebellar parenchymal calcification is present, most commonly seen in association with secondary hyperparathyroidism. Cerebral hemispheres show atrophy with chronic small-vessel ischemic changes of the white matter. Old cortical and subcortical infarctions in the left frontal and parietal regions. Ventricular size is prominent, consistent with central atrophy. No evidence of mass, hemorrhage, hydrocephalus or extra-axial collection. Vascular: There is atherosclerotic calcification of the major vessels at the base of the brain. Skull: Negative Sinuses/Orbits: Clear/normal Other: None IMPRESSION: No acute finding by CT. Atrophy and chronic small-vessel ischemic changes. Old left frontal and parietal infarctions.  Electronically Signed   By: Paulina FusiMark  Shogry M.D.   On: 11/19/2018 13:17   CT  head reviewed discussed with Dr. Thad Rangereynolds ____________________________________________   PROCEDURES  Procedure(s) performed: None  Procedures  Critical Care performed: Yes, see critical care note(s)  CRITICAL CARE Performed by: Sharyn CreamerMark Vedanshi Massaro   Total critical care time: 35 minutes  Critical care time was exclusive of separately billable procedures and treating other patients.  Critical care was necessary to treat or prevent imminent or life-threatening deterioration.  Critical care was time spent personally by me on the following activities: development of treatment plan with patient and/or surrogate as well as nursing, discussions with consultants, evaluation of patient's response to treatment, examination of patient, obtaining history from patient or surrogate, ordering and performing treatments and interventions, ordering and review of laboratory studies, ordering and review of radiographic studies, pulse oximetry and re-evaluation of patient's condition.  ____________________________________________   INITIAL IMPRESSION / ASSESSMENT AND PLAN / ED COURSE  Pertinent labs & imaging results that were available during my care of the patient were reviewed by me and considered in my medical decision making (see chart for details).   Code stroke called is no facial droop reported starting at 1030 today.  Dr. Thad Rangereynolds saw the patient, advised to cancel as she is not a TPA candidate.  Also Dr. Thad Rangereynolds advises that this potential a small stroke is occurred, but this would not significantly be improved will be anticipated to improve by being hospitalized or having stroke work-up, rather Dr. Thad Rangereynolds advises to start dual antiplatelet therapy.  Discussed with the daughter, will be placed on dual antiplatelet therapy for the next 3 weeks, then taper off aspirin as advised by Dr. Thad Rangereynolds.  Has not had any recent falls, has  stable gait and is not felt to be at high risk for an intracranial hemorrhage due to the second antiplatelet therapy for which likely would benefit her as this is concerning for potentially she has had another tiny stroke  Clinical Course as of Nov 19 1538  Sun Nov 19, 2018  1213 Discussed with Dr. Thad Rangereynolds. Advises based on clinical history would not be a good candidate for tPA, needs other causes excluded. Dr. Thad Rangereynolds will see and evaluate.    [MQ]  1223 Dr. Thad Rangereynolds advises cancel code stroke, not a tpa CANDIDATE   [MQ]    Clinical Course User Index [MQ] Sharyn CreamerQuale, Dia Jefferys, MD    Patient calm nicely after receiving Zyprexa and Ativan.  Also urinalysis shows possible UTI, will treat with single dose fosfomycin.  Seen by psychiatry who advises discharge and given a as needed Ativan for the family to utilize.  Patient's daughter is with the patient, comfortable with plan for discharge and close follow-up.  ____________________________________________   FINAL CLINICAL IMPRESSION(S) / ED DIAGNOSES  Final diagnoses:  Dementia with behavioral disturbance, unspecified dementia type (HCC)  Lower urinary tract infectious disease  Facial droop        Note:  This document was prepared using Dragon voice recognition software and may include unintentional dictation errors       Sharyn CreamerQuale, Latish Toutant, MD 11/19/18 1708

## 2018-11-19 NOTE — Discharge Instructions (Addendum)
Take aspirin 81mg  by mouth daily and Plavix 75mg  for three weeks. Stop aspirin after 3 weeks and change to only Plavix 75mg  by mouth daily.   Dementia Caregiver Guide Dementia is a term used to describe a number of symptoms that affect memory and thinking. The most common symptoms include: Memory loss. Trouble with language and communication. Trouble concentrating. Poor judgment. Problems with reasoning. Child-like behavior and language. Extreme anxiety. Angry outbursts. Wandering from home or public places. Dementia usually gets worse slowly over time. In the early stages, people with dementia can stay independent and safe with some help. In later stages, they need help with daily tasks such as dressing, grooming, and using the bathroom. How to help the person with dementia cope Dementia can be frightening and confusing. Here are some tips to help the person with dementia cope with changes caused by the disease. General tips Keep the person on track with his or her routine. Try to identify areas where the person may need help. Be supportive, patient, calm, and encouraging. Gently remind the person that adjusting to changes takes time. Help with the tasks that the person has asked for help with. Keep the person involved in daily tasks and decisions as much as possible. Encourage conversation, but try not to get frustrated or harried if the person struggles to find words or does not seem to appreciate your help. Communication tips When the person is talking or seems frustrated, make eye contact and hold the person's hand. Ask specific questions that need yes or no answers. Use simple words, short sentences, and a calm voice. Only give one direction at a time. When offering choices, limit them to just 1 or 2. Avoid correcting the person in a negative way. If the person is struggling to find the right words, gently try to help him or her. How to recognize symptoms of stress Symptoms of  stress in caregivers include: Feeling frustrated or angry with the person with dementia. Denying that the person has dementia or that his or her symptoms will not improve. Feeling hopeless and unappreciated. Difficulty sleeping. Difficulty concentrating. Feeling anxious, irritable, or depressed. Developing stress-related health problems. Feeling like you have too little time for your own life. Follow these instructions at home:  Make sure that you and the person you are caring for: Get regular sleep. Exercise regularly. Eat regular, nutritious meals. Drink enough fluid to keep your urine clear or pale yellow. Take over-the-counter and prescription medicines only as told by your health care providers. Attend all scheduled health care appointments. Join a support group with others who are caregivers. Ask about respite care resources so that you can have a regular break from the stress of caregiving. Look for signs of stress in yourself and in the person you are caring for. If you notice signs of stress, take steps to manage it. Consider any safety risks and take steps to avoid them. Organize medications in a pill box for each day of the week. Create a plan to handle any legal or financial matters. Get legal or financial advice if needed. Keep a calendar in a central location to remind the person of appointments or other activities. Tips for reducing the risk of injury Keep floors clear of clutter. Remove rugs, magazine racks, and floor lamps. Keep hallways well lit, especially at night. Put a handrail and nonslip mat in the bathtub or shower. Put childproof locks on cabinets that contain dangerous items, such as medicines, alcohol, guns, toxic cleaning items, sharp  tools or utensils, matches, and lighters. Put the locks in places where the person cannot see or reach them easily. This will help ensure that the person does not wander out of the house and get lost. Be prepared for  emergencies. Keep a list of emergency phone numbers and addresses in a convenient area. Remove car keys and lock garage doors so that the person does not try to get in the car and drive. Have the person wear a bracelet that tracks locations and identifies the person as having memory problems. This should be worn at all times for safety. Where to find support: Many individuals and organizations offer support. These include: Support groups for people with dementia and for caregivers. Counselors or therapists. Home health care services. Adult day care centers. Where to find more information Alzheimer[employer]: LimitLaws.huwww.alz.org Contact a health care provider if: The person's health is rapidly getting worse. You are no longer able to care for the person. Caring for the person is affecting your physical and emotional health. The person threatens himself or herself, you, or anyone else. Summary Dementia is a term used to describe a number of symptoms that affect memory and thinking. Dementia usually gets worse slowly over time. Take steps to reduce the person's risk of injury, and to plan for future care. Caregivers need support, relief from caregiving, and time for their own lives. This information is not intended to replace advice given to you by your health care provider. Make sure you discuss any questions you have with your health care provider. Document Released: 04/06/2016 Document Revised: 04/15/2017 Document Reviewed: 04/06/2016 Elsevier Patient Education  2020 [employer]   Arelia Sneddoneepa Snow Positive Approach to Dementia:  Vella RedheadGems Teepa Snow Youtube dementia

## 2018-11-19 NOTE — Consult Note (Signed)
[facility]  11/19/2018 2:48 PM [name]  MRN:  [id number] Principal Problem: CVA, new onset Discharge Diagnoses: Active Problems:   Dementia with behavioral disturbance (HCC)  Subjective: Per her daughter, her POA.  She awakened this morning with increase in agitation and increase in cursing.  Discharged from [place] on 11/17/18 and was doing well until this morning.  After the increased in agitation, she noticed some facial dropping and brought her to the ED.  EDP confirmed a stroke but continues to have swallowing abilities and started some medications.  The daughter reports needed a PRN medication.  Medications discussed and since the Ativan helped in the ED, Ativan PRN decided upon.  Patient is already on Zyprexa.  Daughter would like to take her home, patient is at her baseline, stable for discharge.  Total Time spent with patient: 1 hour  Past Psychiatric History: dementia, bipolar d/o  Past Medical History:  Past Medical History:  Diagnosis Date  . Anxiety   . Arthritis   . Asthma   . Bacteriuria 07/31/2016  . Bipolar 1 disorder (HCC)   . Bipolar 1 disorder, mixed, severe (HCC) 07/26/2016  . Cystitis   . Dementia (HCC)   . Depression   . Heart murmur   . IBS (irritable bowel syndrome)   . Osteoporosis   . Spastic colon   . Swallowing problem 11/09/2016  . Thrombocytopenia (HCC) 08/03/2016  . Ulcer     Past Surgical History:  Procedure Laterality Date  . arm surgery Left 2011   fracture repair  . BREAST BIOPSY     LCIS  . CHOLECYSTECTOMY    . COLONOSCOPY  2002  . ELBOW SURGERY    . SHOULDER SURGERY    . SPINE SURGERY    . throat biopsy   2012  . UPPER GI ENDOSCOPY  2011   Family History:  Family History  Problem Relation Age of Onset  . Heart attack Mother   . Cancer Sister        ovarian  . Cancer Maternal Aunt        breast  . Cancer Maternal Aunt        breast   Family Psychiatric  History: none Social History:  Social History    Substance and Sexual Activity  Alcohol Use No  . Alcohol/week: 0.0 standard drinks   Comment: Ocassional     Social History   Substance and Sexual Activity  Drug Use No    Social History   Socioeconomic History  . Marital status: Married    Spouse name: Not on file  . Number of children: Not on file  . Years of education: Not on file  . Highest education level: Not on file  Occupational History  . Not on file  Social Needs  . Financial resource strain: Not on file  . Food insecurity    Worry: Not on file    Inability: Not on file  . Transportation needs    Medical: Not on file    Non-medical: Not on file  Tobacco Use  . Smoking status: Current Every Day Smoker    Packs/day: 1.00    Years: 20.00    Pack years: 20.00    Types: Cigarettes    Start date: 03/31/1977  . Smokeless tobacco: Never Used  Substance and Sexual Activity  . Alcohol use: No    Alcohol/week: 0.0 standard drinks    Comment: Ocassional  . Drug use: No  . Sexual activity:  Not Currently  Lifestyle  . Physical activity    Days per week: Not on file    Minutes per session: Not on file  . Stress: Not on file  Relationships  . Social Musicianconnections    Talks on phone: Not on file    Gets together: Not on file    Attends religious service: Not on file    Active member of club or organization: Not on file    Attends meetings of clubs or organizations: Not on file    Relationship status: Not on file  Other Topics Concern  . Not on file  Social History Narrative  . Not on file    Has this patient used any form of tobacco in the last 30 days? (Cigarettes, Smokeless Tobacco, Cigars, and/or Pipes) NA  Current Medications: Current Facility-Administered Medications  Medication Dose Route Frequency Provider Last Rate Last Dose  . aspirin EC tablet 81 mg  81 mg Oral Once Sharyn CreamerQuale, Mark, MD      . atorvastatin (LIPITOR) tablet 20 mg  20 mg Oral q1800 Sharyn CreamerQuale, Mark, MD      . clopidogrel (PLAVIX) tablet 75  mg  75 mg Oral Once Sharyn CreamerQuale, Mark, MD      . LORazepam (ATIVAN) tablet 1 mg  1 mg Oral Daily PRN Charm RingsLord,  Y, NP       Current Outpatient Medications  Medication Sig Dispense Refill  . albuterol (VENTOLIN HFA) 108 (90 Base) MCG/ACT inhaler Inhale 2 puffs into the lungs every 4 (four) hours as needed for wheezing or shortness of breath.    Marland Kitchen. aspirin EC 81 MG EC tablet Take 1 tablet (81 mg total) by mouth daily. 30 tablet 0  . cholecalciferol (VITAMIN D3) 25 MCG (1000 UT) tablet Take 1,000 Units by mouth daily.    . divalproex (DEPAKOTE SPRINKLE) 125 MG capsule Take 1 capsule (125 mg total) by mouth every 12 (twelve) hours. (Patient taking differently: Take 625 mg by mouth every 12 (twelve) hours. ) 60 capsule 0  . donepezil (ARICEPT) 5 MG tablet Take 5 mg by mouth at bedtime.    Marland Kitchen. escitalopram (LEXAPRO) 5 MG tablet Take 5 mg by mouth daily.    . folic acid (FOLVITE) 1 MG tablet Take 1 mg by mouth daily.    . mirtazapine (REMERON) 15 MG tablet Take 2 tablets (30 mg total) by mouth at bedtime. (Patient taking differently: Take 15 mg by mouth at bedtime. ) 60 tablet 0  . OLANZapine (ZYPREXA) 10 MG tablet Take 5-10 mg by mouth at bedtime. Takes 5 mg in the morning and 10 mg at bedtime    . traZODone (DESYREL) 50 MG tablet Take 50 mg by mouth at bedtime as needed for sleep.    . vitamin B-12 (CYANOCOBALAMIN) 1000 MCG tablet Take 1,000 mcg by mouth daily.     PTA Medications: (Not in a hospital admission)   Musculoskeletal: Strength & Muscle Tone: within normal limits Gait & Station: did not witness Patient leans: N/A  Psychiatric Specialty Exam: Physical Exam  Nursing note and vitals reviewed. Constitutional: She appears well-developed and well-nourished.  HENT:  Head: Normocephalic.  Neck: Normal range of motion.  Respiratory: Effort normal.  Musculoskeletal: Normal range of motion.  Neurological: She is alert.  Psychiatric: Her speech is normal. Thought content normal. Her mood  appears anxious. Her affect is blunt and labile. She is agitated. Cognition and memory are impaired. She expresses impulsivity.    Review of Systems  Psychiatric/Behavioral: Positive for  memory loss. The patient is nervous/anxious.   All other systems reviewed and are negative.   Blood pressure (!) 149/99, pulse 90, temperature 98.4 F (36.9 C), temperature source Oral, resp. rate 18, height 5\' 3"  (1.6 m), weight 49 kg, SpO2 98 %.Body mass index is 19.14 kg/m.  General Appearance: Casual  Eye Contact:  Fair  Speech:  Normal Rate  Volume:  Increased  Mood:  Anxious and Irritable  Affect:  Blunt  Thought Process:  Disorganized  Orientation:  Other:  person  Thought Content:  unable to assess r/t dementia   Suicidal Thoughts:  No  Homicidal Thoughts:  No  Memory:  Immediate;   Poor Recent;   Poor Remote;   Poor  Judgement:  Impaired  Insight:  Lacking  Psychomotor Activity:  Decreased  Concentration:  Concentration: Poor and Attention Span: Poor  Recall:  Poor  Fund of Knowledge:  unable to assess  Language:  Fair  Akathisia:  No  Handed:  Right  AIMS (if indicated):     Assets:  Leisure Time Resilience Social Support  ADL's:  Impaired  Cognition:  Impaired,  Moderate  Sleep:        Demographic Factors:  Age 68 or older and Caucasian  Loss Factors: Decline in physical health  Historical Factors: Impulsivity  Risk Reduction Factors:   Sense of responsibility to family, Living with another person, especially a relative, Positive social support and Positive therapeutic relationship  Continued Clinical Symptoms:  Anxiety, mild  Cognitive Features That Contribute To Risk:  Loss of executive function    Suicide Risk:  Minimal: No identifiable suicidal ideation.  Patients presenting with no risk factors but with morbid ruminations; may be classified as minimal risk based on the severity of the depressive symptoms   Plan Of Care/Follow-up recommendations:   Dementia with behavioral disturbances: -Started Ativan 1 mg daily PRN -Continued Zyprexa 5 mg in am and 10 mg in the pm -Continue Aricept 5 mg daily  Depression: -Continue Lexapro 5 mg daily  Bipolar d/o: -Continued Depakote 625 mg BID  Insomnia: -Continued Remeron 15 mg at bedtime -Continued Trazodone 50 mg PRN at bedtime Activity:  as tolerated Diet:  heart healthy diet  Disposition: discharge with her daughter  Waylan Boga, NP 11/19/2018, 2:48 PM

## 2018-11-19 NOTE — Progress Notes (Signed)
CODE STROKE- PHARMACY COMMUNICATION   Time CODE STROKE called/page received:7/5 1208  Time response to CODE STROKE was made (in person or via phone): called 1211  Time Stroke Kit retrieved from [place] (only if needed):n/a  Name of Provider/Nurse contacted:Amber   Past Medical History:  Diagnosis Date  . Anxiety   . Arthritis   . Asthma   . Bacteriuria 07/31/2016  . Bipolar 1 disorder ([place])   . Bipolar 1 disorder, mixed, severe ([place]) 07/26/2016  . Cystitis   . Dementia ([place])   . Depression   . Heart murmur   . IBS (irritable bowel syndrome)   . Osteoporosis   . Spastic colon   . Swallowing problem 11/09/2016  . Thrombocytopenia ([place]) 08/03/2016  . Ulcer    Prior to Admission medications   Medication Sig Start Date End Date Taking? Authorizing Provider  aspirin EC 81 MG EC tablet Take 1 tablet (81 mg total) by mouth daily. 10/11/18   Fritzi Mandes, MD  divalproex (DEPAKOTE SPRINKLE) 125 MG capsule Take 1 capsule (125 mg total) by mouth every 12 (twelve) hours. 10/10/18   Fritzi Mandes, MD  feeding supplement, ENSURE ENLIVE, (ENSURE ENLIVE) LIQD Take 237 mLs by mouth 2 (two) times daily between meals. 10/10/18   Fritzi Mandes, MD  mirtazapine (REMERON) 15 MG tablet Take 2 tablets (30 mg total) by mouth at bedtime. 10/10/18   Fritzi Mandes, MD  QUEtiapine (SEROQUEL) 25 MG tablet Take 0.5 tablets (12.5 mg total) by mouth 2 (two) times daily with breakfast and lunch. Patient taking differently: Take 25 mg by mouth 2 (two) times daily with breakfast and lunch.  10/23/18   Glean Hess, MD  QUEtiapine (SEROQUEL) 50 MG tablet Take 1 tablet (50 mg total) by mouth at bedtime. 10/10/18   Fritzi Mandes, MD    Noralee Space ,PharmD Clinical Pharmacist  11/19/2018  12:55 PM

## 2018-11-19 NOTE — ED Triage Notes (Signed)
Pt arrived via POV with reports of altered mental status, pt recently discharged  from [facility]. Daughter states pt has been increasing angry having behavioral problems today when she awoke.  Daughter states a right side facial droop on the right. Daughter concerned pt has had a stroke.  Per daughter, pt has not been swallowing food and holding it in her mouth.

## 2018-11-19 NOTE — Progress Notes (Signed)
   11/19/18 1207  Clinical Encounter Type  Visited With Patient and family together  Visit Type Initial  Referral From Nurse  Consult/Referral To Chaplain  Spiritual Encounters  Spiritual Needs Emotional  CH received page at 1207 of possible stroke patient. Entered room and patient was very aggressive with staff. Patient's daughter was present. [place] introduced self to [facility] and informed her of pastoral care services. No further visits needed.

## 2018-11-19 NOTE — Consult Note (Signed)
Referring Physician: Quale    Chief Complaint: Left facial droop  HPI: Deborah [name] is an 73 y.o. female with a history of dementia who was with her daughter today when she noted acute onset of left facial droop and drooling from the right side of her mouth.  Daughter was concerned about stroke and brought the patient in for evaluation.  At baseline patient is aggressive.  Daughter feels she is more aggressive today as well.   At baseline along with her aggression and agitation the patient is incontinent and nonambulatory.  She feeds herself like a toddler and needs assistance with all ADL's.  Discharged on Friday from [facility] where she had a 3-week IVC.   Initial NIHSS difficult to determine due to cooperativity.  Mild left facial droop noted.  Patient fluent nad moving all extremities strongly.    Date last known well: Date: 11/19/2018 Time last known well: Time: 10:30 tPA Given: No: Patient with poor baseline functioning and minimal neurological findings.    Past Medical History:  Diagnosis Date  . Anxiety   . Arthritis   . Asthma   . Bacteriuria 07/31/2016  . Bipolar 1 disorder (HCC)   . Bipolar 1 disorder, mixed, severe (HCC) 07/26/2016  . Cystitis   . Dementia (HCC)   . Depression   . Heart murmur   . IBS (irritable bowel syndrome)   . Osteoporosis   . Spastic colon   . Swallowing problem 11/09/2016  . Thrombocytopenia (HCC) 08/03/2016  . Ulcer     Past Surgical History:  Procedure Laterality Date  . arm surgery Left 2011   fracture repair  . BREAST BIOPSY     LCIS  . CHOLECYSTECTOMY    . COLONOSCOPY  2002  . ELBOW SURGERY    . SHOULDER SURGERY    . SPINE SURGERY    . throat biopsy   2012  . UPPER GI ENDOSCOPY  2011    Family History  Problem Relation Age of Onset  . Heart attack Mother   . Cancer Sister        ovarian  . Cancer Maternal Aunt        breast  . Cancer Maternal Aunt        breast   Social History:  reports that she has been  smoking cigarettes. She started smoking about 41 years ago. She has a 20.00 pack-year smoking history. She has never used smokeless tobacco. She reports that she does not drink alcohol or use drugs.  Allergies:  Allergies  Allergen Reactions  . Prednisone Nausea And Vomiting, Nausea Only and Other (See Comments)  . Chlorphen-Pe-Acetaminophen Other (See Comments)    Reaction: unknown    Medications: I have reviewed the patient's current medications. Prior to Admission:  Prior to Admission medications   Medication Sig Start Date End Date Taking? Authorizing Provider  albuterol (VENTOLIN HFA) 108 (90 Base) MCG/ACT inhaler Inhale 2 puffs into the lungs every 4 (four) hours as needed for wheezing or shortness of breath.   Yes [provider]  aspirin EC 81 MG EC tablet Take 1 tablet (81 mg total) by mouth daily. 10/11/18  Yes Enedina FinnerPatel, Sona, MD  cholecalciferol (VITAMIN D3) 25 MCG (1000 UT) tablet Take 1,000 Units by mouth daily.   Yes [provider]  divalproex (DEPAKOTE SPRINKLE) 125 MG capsule Take 1 capsule (125 mg total) by mouth every 12 (twelve) hours. Patient taking differently: Take 625 mg by mouth every 12 (twelve) hours.  10/10/18  Yes Enedina FinnerPatel, Sona, MD  donepezil (ARICEPT) 5 MG tablet Take 5 mg by mouth at bedtime.   Yes [provider]  escitalopram (LEXAPRO) 5 MG tablet Take 5 mg by mouth daily.   Yes [provider]  folic acid (FOLVITE) 1 MG tablet Take 1 mg by mouth daily.   Yes [provider]  mirtazapine (REMERON) 15 MG tablet Take 2 tablets (30 mg total) by mouth at bedtime. Patient taking differently: Take 15 mg by mouth at bedtime.  10/10/18  Yes Enedina FinnerPatel, Sona, MD  OLANZapine (ZYPREXA) 10 MG tablet Take 5-10 mg by mouth at bedtime. Takes 5 mg in the morning and 10 mg at bedtime   Yes [provider]  traZODone (DESYREL) 50 MG tablet Take 50 mg by mouth at bedtime as needed for sleep.   Yes [provider]  vitamin B-12  (CYANOCOBALAMIN) 1000 MCG tablet Take 1,000 mcg by mouth daily.   Yes [provider]     ROS: Unable to provide due to mental status  Physical Examination: Blood pressure (!) 157/69, pulse 92, temperature 98.4 F (36.9 C), temperature source Oral, resp. rate 20, height 5\' 3"  (1.6 m), weight 49 kg, SpO2 97 %.  HEENT-  Normocephalic, no lesions, without obvious abnormality.    Neurological Examination   Mental Status: Alert, agitated, combative, uncooperative.  Speech fluent without evidence of aphasia.  Able to follow commands. Cranial Nerves: II: Blinks to bilateral confrontation, pupils equal, round, reactive to light and accommodation III,IV, VI: ptosis not present, extra-ocular motions intact bilaterally V,VII: mild left facial droop VIII: hearing normal bilaterally IX,X: Unable to examine due to cooperation XI: Unable to examine due to cooperation XII: midline tongue extension Motor: Moves all extremities spontaneously Sensory: Unable to examine due to cooperation Deep Tendon Reflexes: Unable to examine due to cooperation Plantars: Unable to examine due to cooperation Cerebellar: Unable to examine due to cooperation Gait: not tested due to safety concerns    Laboratory Studies:  Basic Metabolic Panel: Recent Labs  Lab 11/19/18 1248  NA 141  K 4.1  CL 108  CO2 25  GLUCOSE 119*  BUN 24*  CREATININE 1.18*  CALCIUM 8.3*    Liver Function Tests: Recent Labs  Lab 11/19/18 1248  AST 17  ALT 11  ALKPHOS 65  BILITOT 0.3  PROT 6.5  ALBUMIN 3.5   No results for input(s): LIPASE, AMYLASE in the last 168 hours. No results for input(s): AMMONIA in the last 168 hours.  CBC: Recent Labs  Lab 11/19/18 1248  WBC 10.0  HGB 11.1*  HCT 34.9*  MCV 102.6*  PLT 150    Cardiac Enzymes: No results for input(s): CKTOTAL, CKMB, CKMBINDEX, TROPONINI in the last 168 hours.  BNP: Invalid input(s): POCBNP  CBG: No results for input(s): GLUCAP in the  last 168 hours.  Microbiology: Results for orders placed or performed during the hospital encounter of 10/23/18  Novel Coronavirus, NAA (hospital order; send-out to ref lab)     Status: None   Collection Time: 10/24/18 12:44 AM   Specimen: Nasopharyngeal Swab; Respiratory  Result Value Ref Range Status   SARS-CoV-2, NAA NOT DETECTED NOT DETECTED Final    Comment: (NOTE) This test was developed and its performance characteristics determined by [facility]. This test has not been FDA cleared or approved. This test has been authorized by FDA under an Emergency Use Authorization (EUA). This test is only authorized for the duration of time the declaration that circumstances exist justifying  the authorization of the emergency use of in vitro diagnostic tests for detection of SARS-CoV-2 virus and/or diagnosis of COVID-19 infection under section 564(b)(1) of the Act, 21 U.S.C. 270WCB-7(S)(2), unless the authorization is terminated or revoked sooner. When diagnostic testing is negative, the possibility of a false negative result should be considered in the context of a patient's recent exposures and the presence of clinical signs and symptoms consistent with COVID-19. An individual without symptoms of COVID-19 and who is not shedding SARS-CoV-2 virus would expect to have a negative (not detected) result in this assay. Performed  At: [facility] [place], [place] [id number] Rush Farmer MD W[place]:[id number]    [place]  Final    Comment: Performed at [facility], [address] [place], [place] [address]    Coagulation Studies: Recent Labs    11/19/18 1248  LABPROT 13.0  INR 1.0    Urinalysis: No results for input(s): COLORURINE, LABSPEC, PHURINE, GLUCOSEU, HGBUR, BILIRUBINUR, KETONESUR, PROTEINUR, UROBILINOGEN, NITRITE, LEUKOCYTESUR in the last 168 hours.  Invalid input(s): APPERANCEUR  Lipid Panel:    Component  Value Date/Time   CHOL 156 10/02/2018 0555   TRIG 96 10/02/2018 0555   HDL 35 (L) 10/02/2018 0555   CHOLHDL 4.5 10/02/2018 0555   VLDL 19 10/02/2018 0555   LDLCALC 102 (H) 10/02/2018 0555    HgbA1C:  Lab Results  Component Value Date   HGBA1C 5.1 10/02/2018    Urine Drug Screen:      Component Value Date/Time   LABOPIA NONE DETECTED 10/01/2018 1329   COCAINSCRNUR NONE DETECTED 10/01/2018 1329   LABBENZ NONE DETECTED 10/01/2018 1329   AMPHETMU NONE DETECTED 10/01/2018 1329   THCU NONE DETECTED 10/01/2018 1329   LABBARB NONE DETECTED 10/01/2018 1329    Alcohol Level: No results for input(s): ETH in the last 168 hours.  Imaging: Ct Head Wo Contrast  Result Date: 11/19/2018 CLINICAL DATA:  Acute presentation with altered mental status. Behavioral disturbance. Right-sided facial droop. EXAM: CT HEAD WITHOUT CONTRAST TECHNIQUE: Contiguous axial images were obtained from the base of the skull through the vertex without intravenous contrast. COMPARISON:  MRI 10/01/2018.  CT 10/01/2018 FINDINGS: Brain: No focal abnormality affects the brainstem. Cerebellar parenchymal calcification is present, most commonly seen in association with secondary hyperparathyroidism. Cerebral hemispheres show atrophy with chronic small-vessel ischemic changes of the white matter. Old cortical and subcortical infarctions in the left frontal and parietal regions. Ventricular size is prominent, consistent with central atrophy. No evidence of mass, hemorrhage, hydrocephalus or extra-axial collection. Vascular: There is atherosclerotic calcification of the major vessels at the base of the brain. Skull: Negative Sinuses/Orbits: Clear/normal Other: None IMPRESSION: No acute finding by CT. Atrophy and chronic small-vessel ischemic changes. Old left frontal and parietal infarctions. Electronically Signed   By: Nelson Chimes M.D.   On: 11/19/2018 13:17    Assessment: 73 y.o. female presenting with left facial droop.  Patient  more combative today at home as well.  Not a tPA candidate due to minimal symptoms.  Had MRI and MRA in May of this year showing old infarcts but no evidence of significant arterial occlusions.  A1c normal and LDL elevated at 103.  Patient on ASA at home.  Current head CT is unremarkable.  Due to patient's combative nature would be impossible to get close enough to perform echocardiogram or carotid dopplers.  May benefit from further psych intervention and these studies can be performed at that time or on an outpatient basis.  Stroke Risk Factors - none  Plan: 1. Statin for lipid management with target LDL<70. 2. Dual antiplatelet therapy with ASA 81mg  and Plavix 75mg  for three weeks with change to Plavix 75mg  alone as monotherapy after that time. 3. Psych evaluation    Thana FarrLeslie Amarilys Lyles, MD Neurology [phone] 11/19/2018, 1:34 PM

## 2018-11-20 LAB — URINE CULTURE
Culture: <10000 — AB
Special Requests: NORMAL

## 2018-11-27 ENCOUNTER — Inpatient Hospital Stay: Payer: Medicare HMO | Admitting: Internal Medicine

## 2018-11-27 ENCOUNTER — Telehealth: Payer: Self-pay

## 2018-11-27 NOTE — Telephone Encounter (Signed)
Keesa from [facility] called to verify patient is our patient and to ask [place] to sign home health orders.  Informed she is our patient however patient or her husband may refuse to have these services because she is very combative, and refused to be touched by anyone she does not know.  She verbalized understanding.

## 2018-12-06 ENCOUNTER — Encounter: Payer: Self-pay | Admitting: Emergency Medicine

## 2018-12-06 ENCOUNTER — Emergency Department
Admission: EM | Admit: 2018-12-06 | Discharge: 2018-12-06 | Disposition: A | Discharge status: Home / Self Care | Payer: Medicare HMO | Attending: Emergency Medicine | Admitting: Emergency Medicine

## 2018-12-06 ENCOUNTER — Emergency Department: Payer: Medicare HMO

## 2018-12-06 ENCOUNTER — Other Ambulatory Visit: Payer: Self-pay

## 2018-12-06 DIAGNOSIS — X58XXXA Exposure to other specified factors, initial encounter: Secondary | ICD-10-CM | POA: Insufficient documentation

## 2018-12-06 DIAGNOSIS — J45909 Unspecified asthma, uncomplicated: Secondary | ICD-10-CM | POA: Insufficient documentation

## 2018-12-06 DIAGNOSIS — Y929 Unspecified place or not applicable: Secondary | ICD-10-CM | POA: Insufficient documentation

## 2018-12-06 DIAGNOSIS — Y999 Unspecified external cause status: Secondary | ICD-10-CM | POA: Diagnosis not present

## 2018-12-06 DIAGNOSIS — F0280 Dementia in other diseases classified elsewhere without behavioral disturbance: Secondary | ICD-10-CM | POA: Diagnosis not present

## 2018-12-06 DIAGNOSIS — S99921A Unspecified injury of right foot, initial encounter: Secondary | ICD-10-CM | POA: Diagnosis present

## 2018-12-06 DIAGNOSIS — I129 Hypertensive chronic kidney disease with stage 1 through stage 4 chronic kidney disease, or unspecified chronic kidney disease: Secondary | ICD-10-CM | POA: Insufficient documentation

## 2018-12-06 DIAGNOSIS — F17210 Nicotine dependence, cigarettes, uncomplicated: Secondary | ICD-10-CM | POA: Insufficient documentation

## 2018-12-06 DIAGNOSIS — Z79899 Other long term (current) drug therapy: Secondary | ICD-10-CM | POA: Diagnosis not present

## 2018-12-06 DIAGNOSIS — Z7982 Long term (current) use of aspirin: Secondary | ICD-10-CM | POA: Diagnosis not present

## 2018-12-06 DIAGNOSIS — J449 Chronic obstructive pulmonary disease, unspecified: Secondary | ICD-10-CM | POA: Diagnosis not present

## 2018-12-06 DIAGNOSIS — G301 Alzheimer's disease with late onset: Secondary | ICD-10-CM | POA: Insufficient documentation

## 2018-12-06 DIAGNOSIS — N183 Chronic kidney disease, stage 3 (moderate): Secondary | ICD-10-CM | POA: Insufficient documentation

## 2018-12-06 DIAGNOSIS — Y939 Activity, unspecified: Secondary | ICD-10-CM | POA: Diagnosis not present

## 2018-12-06 DIAGNOSIS — S93601A Unspecified sprain of right foot, initial encounter: Secondary | ICD-10-CM

## 2018-12-06 DIAGNOSIS — M7989 Other specified soft tissue disorders: Secondary | ICD-10-CM | POA: Diagnosis not present

## 2018-12-06 NOTE — ED Notes (Signed)
See triage note  Per husband she developed some redness to right foot   But his am he noticed swelling to lateral aspect

## 2018-12-06 NOTE — ED Provider Notes (Addendum)
[facility] Emergency Department Provider Note ____________________________________________  Time seen: 1235  I have reviewed the triage vital signs and the nursing notes.  HISTORY  Chief Complaint  Ankle Pain  HPI Deborah VALTIERRA is a 73 y.o. female presents to the ED accompanied by her husband, who is her primary caregiver.  Patient with a history of dementia, bipolar disorder, and osteoporosis, presents with some swelling and bruising to the right foot.  The patient does not ambulate without standby assistance, secondary to her chronic dementia and late onset Alzheimer's.  She primarily transitions from bed, to a chair.  She will walk to the bathroom and to the dining room table, and back to her chair during the day.  No reported injury, accident, trauma, or fall is recalled.  Patient is otherwise without complaint.  Past Medical History:  Diagnosis Date  . Anxiety   . Arthritis   . Asthma   . Bacteriuria 07/31/2016  . Bipolar 1 disorder ([place])   . Bipolar 1 disorder, mixed, severe ([place]) 07/26/2016  . Cystitis   . Dementia ([place])   . Depression   . Heart murmur   . IBS (irritable bowel syndrome)   . Osteoporosis   . Spastic colon   . Swallowing problem 11/09/2016  . Thrombocytopenia ([place]) 08/03/2016  . Ulcer     Patient Active Problem List   Diagnosis Date Noted  . Goals of care, counseling/discussion   . Palliative care by specialist   . Acute delirium 10/03/2018  . History of stroke 10/01/2018  . Protein-calorie malnutrition, mild ([place]) 02/17/2018  . Vitamin D deficiency 06/20/2017  . Arthritis 06/17/2017  . Hx of peptic ulcer 06/17/2017  . IBS (irritable bowel syndrome) 06/17/2017  . Unintended weight loss 06/17/2017  . CKD (chronic kidney disease) stage 3, GFR 30-59 ml/min (HCC) 06/17/2017  . Balance problem 11/09/2016  . Late onset Alzheimer's disease with behavioral disturbance ([place]) 11/09/2016  . Macrocytic anemia 08/03/2016  . Hx of breast  cancer 07/28/2016  . Folate deficiency 10/02/2015  . Bipolar 1 disorder, depressed ([place]) 09/25/2015  . Smoker 09/25/2015  . B12 deficiency 09/11/2015  . Anxiety 07/23/2015  . Severe episode of recurrent major depressive disorder ([place]) 07/03/2015  . Dementia with behavioral disturbance ([place]) 05/29/2015  . History of migraine headaches 09/08/2014  . H/O Bell's palsy 09/08/2014  . H/O: HTN (hypertension) 09/08/2014  . Gastroesophageal reflux disease without esophagitis 09/08/2014  . COPD (chronic obstructive pulmonary disease) ([place]) 09/08/2014  . History of asthma 09/08/2014  . Breast neoplasm, Tis (LCIS) 01/23/2013    Past Surgical History:  Procedure Laterality Date  . arm surgery Left 2011   fracture repair  . BREAST BIOPSY     LCIS  . CHOLECYSTECTOMY    . COLONOSCOPY  2002  . ELBOW SURGERY    . SHOULDER SURGERY    . SPINE SURGERY    . throat biopsy   2012  . UPPER GI ENDOSCOPY  2011    Prior to Admission medications   Medication Sig Start Date End Date Taking? Authorizing Provider  albuterol (VENTOLIN HFA) 108 (90 Base) MCG/ACT inhaler Inhale 2 puffs into the lungs every 4 (four) hours as needed for wheezing or shortness of breath.    [provider]  aspirin EC 81 MG EC tablet Take 1 tablet (81 mg total) by mouth daily. 10/11/18   Fritzi Mandes, MD  atorvastatin (LIPITOR) 20 MG tablet Take 1 tablet (20 mg total) by mouth daily at 6 PM.  11/19/18   Charm RingsLord, Jamison Y, NP  cholecalciferol (VITAMIN D3) 25 MCG (1000 UT) tablet Take 1,000 Units by mouth daily.    [provider]  divalproex (DEPAKOTE SPRINKLE) 125 MG capsule Take 5 capsules (625 mg total) by mouth every 12 (twelve) hours. 11/19/18   Charm RingsLord, Jamison Y, NP  donepezil (ARICEPT) 5 MG tablet Take 5 mg by mouth at bedtime.    [provider]  escitalopram (LEXAPRO) 5 MG tablet Take 5 mg by mouth daily.    [provider]  folic acid (FOLVITE) 1 MG tablet Take 1 mg by mouth daily.    [provider]  LORazepam (ATIVAN) 1 MG tablet Take 1 tablet (1 mg total) by mouth daily as needed for anxiety. 11/19/18   Charm RingsLord, Jamison Y, NP  mirtazapine (REMERON) 15 MG tablet Take 2 tablets (30 mg total) by mouth at bedtime. Patient taking differently: Take 15 mg by mouth at bedtime.  10/10/18   Enedina FinnerPatel, Sona, MD  OLANZapine (ZYPREXA) 10 MG tablet Take 5-10 mg by mouth at bedtime. Takes 5 mg in the morning and 10 mg at bedtime    [provider]  traZODone (DESYREL) 50 MG tablet Take 50 mg by mouth at bedtime as needed for sleep.    [provider]  vitamin B-12 (CYANOCOBALAMIN) 1000 MCG tablet Take 1,000 mcg by mouth daily.    [provider]    Allergies Prednisone and Chlorphen-pe-acetaminophen  Family History  Problem Relation Age of Onset  . Heart attack Mother   . Cancer Sister        ovarian  . Cancer Maternal Aunt        breast  . Cancer Maternal Aunt        breast    Social History Social History   Tobacco Use  . Smoking status: Current Every Day Smoker    Packs/day: 1.00    Years: 20.00    Pack years: 20.00    Types: Cigarettes    Start date: 03/31/1977  . Smokeless tobacco: Never Used  Substance Use Topics  . Alcohol use: No    Alcohol/week: 0.0 standard drinks    Comment: Ocassional  . Drug use: No    Review of Systems  Constitutional: Negative for fever. Cardiovascular: Negative for chest pain. Respiratory: Negative for shortness of breath. Musculoskeletal: Negative for back pain. Right foot swelling and bruising.  Skin: Negative for rash. Neurological: Negative for headaches, focal weakness or numbness. ____________________________________________  PHYSICAL EXAM:  VITAL SIGNS: ED Triage Vitals  Enc Vitals Group     BP 12/06/18 1144 (!) 123/93     Pulse Rate 12/06/18 1144 69     Resp 12/06/18 1144 17     Temp 12/06/18 1144 98 F (36.7 C)     Temp Source 12/06/18 1144 Oral     SpO2 12/06/18 1144 98 %     Weight  12/06/18 1145 120 lb (54.4 kg)     Height 12/06/18 1145 5\' 4"  (1.626 m)     Head Circumference --      Peak Flow --      Pain Score --      Pain Loc --      Pain Edu? --      Excl. in GC? --     Constitutional: Alert and oriented. Well appearing and in no distress. Head: Normocephalic and atraumatic. Eyes: Conjunctivae are normal. Normal extraocular movements Cardiovascular: Normal rate, regular rhythm. Normal distal pulses and cap  refill distally. Respiratory: Normal respiratory effort. No wheezes/rales/rhonchi. Musculoskeletal: Right foot with obvious swelling and mild ecchymosis noted to the dorsolateral aspect.  Patient without any focal tenderness to palpation over the foot and ankle.  Ankle exam is benign without any signs of disability.  No calf or Achilles tenderness is elicited.  Knee exam is also benign with full range of motion no signs of any internal derangement.  Nontender with normal range of motion in all extremities.  Neurologic:  CN II-XII grossly intact. Normal speech and language. No gross focal neurologic deficits are appreciated. Skin:  Skin is warm, dry and intact. No rash noted. Psychiatric: Mood and affect are normal. Patient exhibits appropriate insight and judgment. ____________________________________________   RADIOLOGY  DG Right Foot Negative   I, Tyreke Kaeser V Bacon-Jamesyn Lindell, personally viewed and evaluated these images (plain radiographs) as part of my medical decision making, as well as reviewing the written report by the radiologist. ____________________________________________  PROCEDURES  Procedures Ace bandage ____________________________________________  INITIAL IMPRESSION / ASSESSMENT AND PLAN / ED COURSE  Deborah [name] was evaluated in Emergency Department on 12/06/2018 for the symptoms described in the history of present illness. She was evaluated in the context of the global COVID-19 pandemic, which necessitated consideration that the patient  might be at risk for infection with the SARS-CoV-2 virus that causes COVID-19. Institutional protocols and algorithms that pertain to the evaluation of patients at risk for COVID-19 are in a state of rapid change based on information released by regulatory bodies including the CDC and federal and state organizations. These policies and algorithms were followed during the patient's care in the ED.  Geriatric patient with ED evaluation over possible injury to the foot.  Patient's exam is concerning for possible foot sprain versus underlying fracture.  X-ray does not reveal any acute fracture or dislocation.  Patient clinical patient likely consistent with a foot sprain or contusion.  Patient without any signs of local infection or cellulitis.  She is discharged to the care of her husband, with instructions to rest with the foot elevated when seated.  An Ace bandage is provided for support.  They will follow-up with primary provider for ongoing symptoms or return to the ED as necessary. ____________________________________________  FINAL CLINICAL IMPRESSION(S) / ED DIAGNOSES  Final diagnoses:  Sprain of right foot, initial encounter      Lissa HoardMenshew, Javonda Suh V Bacon, PA-C 12/06/18 [address]1505    Kuron Docken, Charlesetta IvoryJenise V Bacon, PA-C 12/06/18 1505    Shaune PollackIsaacs, [place], MD 12/08/18 2325

## 2018-12-06 NOTE — Discharge Instructions (Addendum)
Your exam and XR do not show any fractures. She likely had a foot sprain which caused some bruising and swelling to the foot. There is no sign of infection. Rest with the foot elevated and apply cool compresses. Follow-up with the primary provider as needed.

## 2018-12-06 NOTE — ED Triage Notes (Signed)
Pt arrives with concerns over possible injury to pt's right foot. Swelling observed.

## 2018-12-08 ENCOUNTER — Other Ambulatory Visit: Payer: Self-pay

## 2018-12-08 DIAGNOSIS — F319 Bipolar disorder, unspecified: Secondary | ICD-10-CM

## 2018-12-08 MED ORDER — FOLIC ACID 1 MG PO TABS: 1.0000 mg | ORAL_TABLET | Freq: Every day | ORAL | 0 refills | Status: DC

## 2018-12-08 MED ORDER — MIRTAZAPINE 15 MG PO TABS: 15.0000 mg | ORAL_TABLET | Freq: Every day | ORAL | 0 refills | Status: DC

## 2018-12-29 ENCOUNTER — Other Ambulatory Visit: Payer: Self-pay | Admitting: Internal Medicine

## 2018-12-29 ENCOUNTER — Telehealth: Payer: Self-pay

## 2018-12-29 DIAGNOSIS — F02818 Dementia in other diseases classified elsewhere, unspecified severity, with other behavioral disturbance: Secondary | ICD-10-CM

## 2018-12-29 DIAGNOSIS — F0281 Dementia in other diseases classified elsewhere with behavioral disturbance: Secondary | ICD-10-CM

## 2018-12-29 DIAGNOSIS — F332 Major depressive disorder, recurrent severe without psychotic features: Secondary | ICD-10-CM

## 2018-12-29 DIAGNOSIS — F319 Bipolar disorder, unspecified: Secondary | ICD-10-CM

## 2018-12-29 MED ORDER — FOLIC ACID 1 MG PO TABS: 1.0000 mg | ORAL_TABLET | Freq: Every day | ORAL | 0 refills | Status: DC

## 2018-12-29 MED ORDER — OLANZAPINE 10 MG PO TABS: 5.0000 mg | ORAL_TABLET | Freq: Every day | ORAL | 1 refills | Status: DC

## 2018-12-29 MED ORDER — TRAZODONE HCL 50 MG PO TABS: 50.0000 mg | ORAL_TABLET | Freq: Every evening | ORAL | 1 refills | Status: DC | PRN

## 2018-12-29 MED ORDER — DONEPEZIL HCL 5 MG PO TABS: 5.0000 mg | ORAL_TABLET | Freq: Every day | ORAL | 1 refills | Status: DC

## 2018-12-29 MED ORDER — DIVALPROEX SODIUM 125 MG PO CSDR: 625.0000 mg | DELAYED_RELEASE_CAPSULE | Freq: Two times a day (BID) | ORAL | 1 refills | Status: DC

## 2018-12-29 MED ORDER — ATORVASTATIN CALCIUM 20 MG PO TABS: 20.0000 mg | ORAL_TABLET | Freq: Every day | ORAL | 1 refills | Status: DC

## 2018-12-29 MED ORDER — LORAZEPAM 1 MG PO TABS: 1.0000 mg | ORAL_TABLET | Freq: Every day | ORAL | 0 refills | Status: DC | PRN

## 2018-12-29 MED ORDER — ESCITALOPRAM OXALATE 5 MG PO TABS: 5.0000 mg | ORAL_TABLET | Freq: Every day | ORAL | 1 refills | Status: DC

## 2018-12-29 MED ORDER — MIRTAZAPINE 15 MG PO TABS: 15.0000 mg | ORAL_TABLET | Freq: Every day | ORAL | 0 refills | Status: DC

## 2018-12-29 NOTE — Telephone Encounter (Signed)
Daughter said she spoke to [place] about refills and updated med list last OV and none of the refills were sent in. Also reports patient is unable to sit and they tie her to chair to sit up and eat. She also has been choking as she is forgetting to chew and swallow. Can not do HH or Assisted Living. Linna Hoff is much better but both are staying with daughter until she is in skilled nursing facility and a guardian granted from [place]. Asking for all refills and wanted advice on choking prevention. Advised to refrain from foods that can be choked on easily.Do not leave unattended and monitor weight loss if not getting food in due to this. [facility]

## 2018-12-29 NOTE — Telephone Encounter (Signed)
Refills sent to pharmacy.  Referral made to CCM.

## 2019-01-02 ENCOUNTER — Ambulatory Visit: Payer: Self-pay | Admitting: Licensed Clinical Social Worker

## 2019-01-02 ENCOUNTER — Telehealth: Payer: Self-pay

## 2019-01-02 DIAGNOSIS — F0281 Dementia in other diseases classified elsewhere with behavioral disturbance: Secondary | ICD-10-CM

## 2019-01-02 DIAGNOSIS — Z789 Other specified health status: Secondary | ICD-10-CM

## 2019-01-02 DIAGNOSIS — F332 Major depressive disorder, recurrent severe without psychotic features: Secondary | ICD-10-CM

## 2019-01-02 DIAGNOSIS — F0391 Unspecified dementia with behavioral disturbance: Secondary | ICD-10-CM

## 2019-01-02 DIAGNOSIS — F319 Bipolar disorder, unspecified: Secondary | ICD-10-CM

## 2019-01-02 DIAGNOSIS — F02818 Dementia in other diseases classified elsewhere, unspecified severity, with other behavioral disturbance: Secondary | ICD-10-CM

## 2019-01-02 DIAGNOSIS — N183 Chronic kidney disease, stage 3 unspecified: Secondary | ICD-10-CM

## 2019-01-02 DIAGNOSIS — J449 Chronic obstructive pulmonary disease, unspecified: Secondary | ICD-10-CM

## 2019-01-02 NOTE — Telephone Encounter (Signed)
-----   Message from Glean Hess, MD sent at 01/01/2019  3:44 PM EDT ----- Regarding: RE: CCM She is the one who has severe dementia, behavior issues and is living with her daughter and her family but the situation is not acceptable.  They tried to get her placed in ALF but of course were denies.  She could benefit from SW for sure, and maybe medication assistance. ----- Message ----- From: Clemetine Marker, LPN Sent: 10/21/3708   3:43 PM EDT To: Glean Hess, MD Subject: RE: CCM                                        Do you know what type of help is needed? If it's more chronic care management for disease or med management I can send to CCM team but if it's more community based resources like food, housing, utilities, etc., I will send to the C3 team. Thanks!  ----- Message ----- From: Glean Hess, MD Sent: 12/29/2018   4:21 PM EDT To: Clemetine Marker, LPN Subject: CCM                                            This patient and family needs some help.  Please see if CCM can step and give assistance. Call the daughter Jerolyn Center who is taking care of her at this time.  Thanks

## 2019-01-02 NOTE — Chronic Care Management (AMB) (Signed)
  Chronic Care Management    Clinical Social Work General Note  01/02/2019 Name: Deborah [name] MRN: [id number] DOB: Jul 25, 1945  Deborah [name] is a 73 y.o. year old female who is a primary care patient of Deborah Hess, MD. The CCM was consulted to assist the patient with Level of Care Concerns.   Deborah [name] was given information about Chronic Care Management services today including:  1. CCM service includes personalized support from designated clinical staff supervised by her physician, including individualized plan of care and coordination with other care providers 2. 24/7 contact phone numbers for assistance for urgent and routine care needs. 3. Service will only be billed when office clinical staff spend 20 minutes or more in a month to coordinate care. 4. Only one practitioner may furnish and bill the service in a calendar month. 5. The patient may stop CCM services at any time (effective at the end of the month) by phone call to the office staff. 6. The patient will be responsible for cost sharing ([name]-pay) of up to 20% of the service fee (after annual deductible is met).  Patient agreed to services and verbal consent obtained.   Review of patient status, including review of consultants reports, relevant laboratory and other test results, and collaboration with appropriate care team members and the patient's provider was performed as part of comprehensive patient evaluation and provision of chronic care management services.    Goals Addressed    . We are working on LTC placement for [name]       Current Barriers:  . Financial constraints related to Deborah [name] for Deborah [name] . Limited social support . Level of care concerns . ADL IADL limitations . Mental Health Concerns  . Limited education about petitioning the court* . Limited access to caregiver . Cognitive Deficits . Memory Deficits  Clinical Social Work Clinical Goal(s):  Deborah [name] Kitchen Over the next 90 days,  client will work with SW to address concerns related to being eligible for Special Assistance Medicaid and securely LTC placement   Interventions: . Patient interviewed and appropriate assessments performed . Provided patient with information about the LTC placement process and Medicaid eligibility requirements . Discussed plans with patient for ongoing care management follow up and provided patient with direct contact information for care management team . Advised patient to inform Deborah [name] once court date has been scheduled as the family is petitioning the court to remove patient's name from the house deed in order for her to qualify for Medicaid . Collaborated with RN Case Manager re: Possible UTI and request for Deborah [name] involvement  . Assisted patient/caregiver with obtaining information about health plan benefits . Provided education to patient/caregiver regarding level of care options. . Education family on available personal care services within the area.  Patient Self Care Activities:  . Currently UNABLE TO independently take appropriate care of self and needs placement  Initial goal documentation     Follow Up Plan: SW will follow up with patient by phone over the next 2-4 weeks      Deborah [name], [place], MSW, [email or web address] Phone: [phone]

## 2019-01-02 NOTE — Telephone Encounter (Signed)
Referral placed for CCM for social worker and pharmacist. See below and referral notes for details.

## 2019-01-04 ENCOUNTER — Ambulatory Visit: Payer: Self-pay | Admitting: *Deleted

## 2019-01-04 ENCOUNTER — Telehealth: Payer: Self-pay

## 2019-01-04 DIAGNOSIS — F0391 Unspecified dementia with behavioral disturbance: Secondary | ICD-10-CM

## 2019-01-04 NOTE — Chronic Care Management (AMB) (Signed)
  Chronic Care Management   Outreach Note  01/04/2019 Name: KATRIANNA FRIESENHAHN MRN: [id number] DOB: November 30, 1945  Referred by: Glean Hess, MD Reason for referral : Chronic Care Management (Unsuccessful outreach x1 )   An unsuccessful telephone outreach was attempted today. The patient was referred to the case management team by for assistance with chronic care management and care coordination.   Follow Up Plan: A HIPPA compliant phone message was left for the patient providing contact information and requesting a return call.  The care management team will reach out to the patient again over the next 7  days.   Merlene Morse Byrd Rushlow RN, BSN Nurse Case Counselling psychologist [facility] Care Management  [phone]) [employer]

## 2019-01-08 ENCOUNTER — Ambulatory Visit: Payer: Self-pay | Admitting: Pharmacist

## 2019-01-08 DIAGNOSIS — F0391 Unspecified dementia with behavioral disturbance: Secondary | ICD-10-CM

## 2019-01-08 NOTE — Chronic Care Management (AMB) (Signed)
Chronic Care Management   Follow Up Note   01/08/2019 Name: BISMA KLETT MRN: [id number] DOB: 02-Feb-1946  Referred by: Glean Hess, MD Reason for referral : Chronic Care Management (Initial Telephone Outreach Call)   Deborah [name] is a 73 y.o. year old female who is a primary care patient of Army Melia Jesse Sans, MD. The CCM team was consulted for assistance with chronic disease management and care coordination needs.     Receive referral from clinic Nurse Health Advisor/PCP to reach out to patient's caregiver/daughter, Jerolyn Center, for assessment of medication assistance   Outreach to caregiver by phone today.  Outpatient Encounter Medications as of 01/08/2019  Medication Sig  . albuterol (VENTOLIN HFA) 108 (90 Base) MCG/ACT inhaler Inhale 2 puffs into the lungs every 4 (four) hours as needed for wheezing or shortness of breath.  Marland Kitchen aspirin EC 81 MG EC tablet Take 1 tablet (81 mg total) by mouth daily.  Marland Kitchen atorvastatin (LIPITOR) 20 MG tablet Take 1 tablet (20 mg total) by mouth daily at 6 PM.  . cholecalciferol (VITAMIN D3) 25 MCG (1000 UT) tablet Take 1,000 Units by mouth daily.  . divalproex (DEPAKOTE SPRINKLE) 125 MG capsule Take 5 capsules (625 mg total) by mouth every 12 (twelve) hours.  Marland Kitchen donepezil (ARICEPT) 5 MG tablet Take 1 tablet (5 mg total) by mouth at bedtime.  Marland Kitchen escitalopram (LEXAPRO) 5 MG tablet Take 1 tablet (5 mg total) by mouth daily.  . folic acid (FOLVITE) 1 MG tablet Take 1 tablet (1 mg total) by mouth daily.  Marland Kitchen LORazepam (ATIVAN) 1 MG tablet Take 1 tablet (1 mg total) by mouth daily as needed for anxiety.  . mirtazapine (REMERON) 15 MG tablet Take 1 tablet (15 mg total) by mouth at bedtime.  Marland Kitchen OLANZapine (ZYPREXA) 10 MG tablet Take 0.5-1 tablets (5-10 mg total) by mouth at bedtime. Takes 5 mg in the morning and 10 mg at bedtime  . traZODone (DESYREL) 50 MG tablet Take 1 tablet (50 mg total) by mouth at bedtime as needed for sleep.  . vitamin B-12  (CYANOCOBALAMIN) 1000 MCG tablet Take 1,000 mcg by mouth daily.   No facility-administered encounter medications on file as of 01/08/2019.     Goals Addressed            This Visit's Progress   . COMPLETED: PharmD - Medication Assistance Assessment       Current Barriers:  . Financial constraints - related to living situation/level of care needs . Cognitive Deficits . Memory Deficits  Pharmacist Clinical Goal(s):  Marland Kitchen Over the next 1 day, patient will work with CM Pharmacist to assess need for medication assistance  Interventions: . Interview caregiver/daughter regarding medication assistance needs - Ms. Berline Lopes denies any medication assistance needs or any current barriers to patient obtaining her medications . Patient's daughter declines to complete a medication review on behalf of patient at this time . Discussed plans with patient for ongoing care management follow up and provided patient with direct contact information for care management team . Will collaborate with [place] and CM Social Worker  Patient Self Care Activities:  . Currently UNABLE TO independently manage her medications  - assistance provided by family   Initial goal documentation        Plan 1) Caregiver denies medication questions/concerns on behalf of patient 2) Caregiver has been provided with contact information for the care management team and has been advised to call with any health related questions or  concerns.   Harlow Asa, PharmD, [place] [employer] [phone]

## 2019-01-08 NOTE — Patient Instructions (Signed)
Thank you allowing the Chronic Care Management Team to be a part of your care! It was a pleasure speaking with you today!     CCM (Chronic Care Management) Team    Janci Minor RN, BSN Nurse Care Coordinator  [phone]   Harlow Asa PharmD  Clinical Pharmacist  [phone]   Eula Fried LCSW Clinical Social Worker [phone]  Visit Information  Goals Addressed            This Visit's Progress   . COMPLETED: PharmD - Medication Assistance Assessment       Current Barriers:  . Financial constraints - related to living situation/level of care needs . Cognitive Deficits . Memory Deficits  Pharmacist Clinical Goal(s):  Marland Kitchen Over the next 1 day, patient will work with CM Pharmacist to assess need for medication assistance  Interventions: . Interview caregiver/daughter regarding medication assistance needs - Ms. Berline Lopes denies any medication assistance needs or any current barriers to patient obtaining her medications . Patient's daughter declines to complete a medication review on behalf of patient at this time . Discussed plans with patient for ongoing care management follow up and provided patient with direct contact information for care management team . Will collaborate with [place] and CM Social Worker  Patient Self Care Activities:  . Currently UNABLE TO independently manage her medications  - assistance provided by family   Initial goal documentation        The patient verbalized understanding of instructions provided today and declined a print copy of patient instruction materials.   Caregiver has been provided with contact information for the care management team and has been advised to call with any health related questions or concerns.   Harlow Asa, PharmD, [place] [employer] [phone]

## 2019-01-09 ENCOUNTER — Telehealth: Payer: Self-pay

## 2019-01-09 ENCOUNTER — Ambulatory Visit: Payer: Self-pay | Admitting: Licensed Clinical Social Worker

## 2019-01-09 ENCOUNTER — Other Ambulatory Visit (HOSPITAL_COMMUNITY): Payer: Self-pay | Admitting: Psychiatry

## 2019-01-09 DIAGNOSIS — F319 Bipolar disorder, unspecified: Secondary | ICD-10-CM

## 2019-01-09 NOTE — Chronic Care Management (AMB) (Signed)
  Chronic Care Management    Clinical Social Work Follow Up Note  01/09/2019 Name: Deborah [name] MRN: [id number] DOB: 29-Jul-1945  Deborah [name] is a 72 y.o. year old female who is a primary care patient of Army Melia Jesse Sans, MD. The CCM team was consulted for assistance with Level of Care Concerns.   Review of patient status, including review of consultants reports, other relevant assessments, and collaboration with appropriate care team members and the patient's provider was performed as part of comprehensive patient evaluation and provision of chronic care management services.    Outpatient Encounter Medications as of 01/09/2019  Medication Sig  . albuterol (VENTOLIN HFA) 108 (90 Base) MCG/ACT inhaler Inhale 2 puffs into the lungs every 4 (four) hours as needed for wheezing or shortness of breath.  Marland Kitchen aspirin EC 81 MG EC tablet Take 1 tablet (81 mg total) by mouth daily.  Marland Kitchen atorvastatin (LIPITOR) 20 MG tablet Take 1 tablet (20 mg total) by mouth daily at 6 PM.  . cholecalciferol (VITAMIN D3) 25 MCG (1000 UT) tablet Take 1,000 Units by mouth daily.  . divalproex (DEPAKOTE SPRINKLE) 125 MG capsule Take 5 capsules (625 mg total) by mouth every 12 (twelve) hours.  Marland Kitchen donepezil (ARICEPT) 5 MG tablet Take 1 tablet (5 mg total) by mouth at bedtime.  Marland Kitchen escitalopram (LEXAPRO) 5 MG tablet Take 1 tablet (5 mg total) by mouth daily.  . folic acid (FOLVITE) 1 MG tablet Take 1 tablet (1 mg total) by mouth daily.  Marland Kitchen LORazepam (ATIVAN) 1 MG tablet Take 1 tablet (1 mg total) by mouth daily as needed for anxiety.  . mirtazapine (REMERON) 15 MG tablet Take 1 tablet (15 mg total) by mouth at bedtime.  Marland Kitchen OLANZapine (ZYPREXA) 10 MG tablet Take 0.5-1 tablets (5-10 mg total) by mouth at bedtime. Takes 5 mg in the morning and 10 mg at bedtime  . traZODone (DESYREL) 50 MG tablet Take 1 tablet (50 mg total) by mouth at bedtime as needed for sleep.  . vitamin B-12 (CYANOCOBALAMIN) 1000 MCG tablet Take 1,000 mcg by  mouth daily.   No facility-administered encounter medications on file as of 01/09/2019.     LCSW completed CCM outreach attempt today but was unable to reach patient successfully. A HIPPA compliant voice message was left encouraging patient to return call once available. LCSW rescheduled CCM SW appointment as well.  Follow Up Plan: SW will follow up with patient by phone over the next 2-4 weeks  Eula Fried, [place], MSW, [email or web address] Phone: [phone]

## 2019-01-11 ENCOUNTER — Telehealth: Payer: Self-pay

## 2019-01-11 ENCOUNTER — Ambulatory Visit: Payer: Self-pay | Admitting: *Deleted

## 2019-01-11 DIAGNOSIS — F0391 Unspecified dementia with behavioral disturbance: Secondary | ICD-10-CM

## 2019-01-11 NOTE — Chronic Care Management (AMB) (Signed)
  Chronic Care Management   Outreach Note  01/11/2019 Name: Deborah [name] MRN: [id number] DOB: 14-Apr-1946  Referred by: Glean Hess, MD Reason for referral : Chronic Care Management (Unsuccessfull Outreach )   A second unsuccessful telephone outreach was attempted today. The patient was referred to the case management team for assistance with chronic care management and care coordination.   Follow Up Plan: A HIPPA compliant phone message was left for the patient providing contact information and requesting a return call.  The care management team will reach out to the patient again over the next 30 days.   Merlene Morse Donnivan Villena RN, BSN Nurse Case Counselling psychologist Clinic/[facility]  [phone]) Business Mobile

## 2019-01-16 ENCOUNTER — Ambulatory Visit: Payer: Self-pay | Admitting: Licensed Clinical Social Worker

## 2019-01-16 ENCOUNTER — Telehealth: Payer: Self-pay

## 2019-01-16 NOTE — Chronic Care Management (AMB) (Signed)
  Care Management   Follow Up Note   01/16/2019 Name: Deborah [name] MRN: [id number] DOB: [birth year]  Referred by: Glean Hess, MD Reason for referral : [name] is a 73 y.o. year old female who is a primary care patient of Glean Hess, MD. The care management team was consulted for assistance with care management and care coordination needs.    Review of patient status, including review of consultants reports, relevant laboratory and other test results, and collaboration with appropriate care team members and the patient's provider was performed as part of comprehensive patient evaluation and provision of chronic care management services.    LCSW completed CCM outreach attempt today but was unable to reach patient successfully. A HIPPA compliant voice message was left encouraging patient to return call once available. LCSW rescheduled CCM SW appointment as well.   A HIPPA compliant phone message was left for the patient providing contact information and requesting a return call.   Eula Fried, BSW, MSW, [email or web address] Phone: [phone]

## 2019-01-18 ENCOUNTER — Ambulatory Visit: Payer: Self-pay | Admitting: *Deleted

## 2019-01-18 DIAGNOSIS — F0391 Unspecified dementia with behavioral disturbance: Secondary | ICD-10-CM

## 2019-01-18 NOTE — Chronic Care Management (AMB) (Signed)
  Chronic Care Management   Outreach Note  01/18/2019 Name: Deborah [name] MRN: [id number] DOB: [birth year]  Referred by: Glean Hess, MD Reason for referral : Care Coordination (Voicemail, Unsuccessful return call )  Received a VM from daughter Johnsie Cancel who states her mother(patient) was doing well, no further symptoms of UTI and did not have any needs at this time.  I attempted to return daughter's call.  A second unsuccessful telephone outreach was attempted today. The patient was referred to the case management team for assistance with chronic care management and care coordination.   Follow Up Plan: A HIPPA compliant phone message was left for the patient providing contact information and requesting a return call.  The care management team will reach out to the patient again over the next 30 days.   Merlene Morse Terrye Dombrosky RN, BSN Nurse Case Editor, [facility]  [phone]) Business Mobile

## 2019-01-23 ENCOUNTER — Telehealth: Payer: Self-pay

## 2019-01-25 ENCOUNTER — Other Ambulatory Visit: Payer: Self-pay | Admitting: Internal Medicine

## 2019-01-25 DIAGNOSIS — F332 Major depressive disorder, recurrent severe without psychotic features: Secondary | ICD-10-CM

## 2019-01-26 ENCOUNTER — Other Ambulatory Visit: Payer: Self-pay | Admitting: Internal Medicine

## 2019-02-13 ENCOUNTER — Inpatient Hospital Stay
Admission: EM | Admit: 2019-02-13 | Discharge: 2019-02-16 | DRG: 163 | Disposition: A | Discharge status: Home Health | Payer: Medicare HMO | Attending: Internal Medicine | Admitting: Internal Medicine

## 2019-02-13 ENCOUNTER — Other Ambulatory Visit: Payer: Self-pay

## 2019-02-13 ENCOUNTER — Emergency Department: Payer: Medicare HMO

## 2019-02-13 ENCOUNTER — Encounter: Payer: Self-pay | Admitting: Emergency Medicine

## 2019-02-13 ENCOUNTER — Telehealth: Payer: Self-pay

## 2019-02-13 DIAGNOSIS — Z20828 Contact with and (suspected) exposure to other viral communicable diseases: Secondary | ICD-10-CM | POA: Diagnosis not present

## 2019-02-13 DIAGNOSIS — Z993 Dependence on wheelchair: Secondary | ICD-10-CM

## 2019-02-13 DIAGNOSIS — J9600 Acute respiratory failure, unspecified whether with hypoxia or hypercapnia: Secondary | ICD-10-CM | POA: Diagnosis not present

## 2019-02-13 DIAGNOSIS — Z7982 Long term (current) use of aspirin: Secondary | ICD-10-CM | POA: Diagnosis not present

## 2019-02-13 DIAGNOSIS — R0689 Other abnormalities of breathing: Secondary | ICD-10-CM | POA: Diagnosis not present

## 2019-02-13 DIAGNOSIS — R0602 Shortness of breath: Secondary | ICD-10-CM | POA: Diagnosis not present

## 2019-02-13 DIAGNOSIS — J690 Pneumonitis due to inhalation of food and vomit: Secondary | ICD-10-CM | POA: Diagnosis not present

## 2019-02-13 DIAGNOSIS — M810 Age-related osteoporosis without current pathological fracture: Secondary | ICD-10-CM | POA: Diagnosis present

## 2019-02-13 DIAGNOSIS — Z9049 Acquired absence of other specified parts of digestive tract: Secondary | ICD-10-CM | POA: Diagnosis not present

## 2019-02-13 DIAGNOSIS — Z853 Personal history of malignant neoplasm of breast: Secondary | ICD-10-CM | POA: Diagnosis not present

## 2019-02-13 DIAGNOSIS — F17210 Nicotine dependence, cigarettes, uncomplicated: Secondary | ICD-10-CM | POA: Diagnosis present

## 2019-02-13 DIAGNOSIS — Z23 Encounter for immunization: Secondary | ICD-10-CM | POA: Diagnosis not present

## 2019-02-13 DIAGNOSIS — I10 Essential (primary) hypertension: Secondary | ICD-10-CM | POA: Diagnosis not present

## 2019-02-13 DIAGNOSIS — R001 Bradycardia, unspecified: Secondary | ICD-10-CM | POA: Diagnosis not present

## 2019-02-13 DIAGNOSIS — J9601 Acute respiratory failure with hypoxia: Secondary | ICD-10-CM | POA: Diagnosis present

## 2019-02-13 DIAGNOSIS — J441 Chronic obstructive pulmonary disease with (acute) exacerbation: Secondary | ICD-10-CM | POA: Diagnosis not present

## 2019-02-13 DIAGNOSIS — J9602 Acute respiratory failure with hypercapnia: Secondary | ICD-10-CM | POA: Diagnosis not present

## 2019-02-13 DIAGNOSIS — T17920A Food in respiratory tract, part unspecified causing asphyxiation, initial encounter: Secondary | ICD-10-CM

## 2019-02-13 DIAGNOSIS — F0390 Unspecified dementia without behavioral disturbance: Secondary | ICD-10-CM | POA: Diagnosis not present

## 2019-02-13 DIAGNOSIS — N183 Chronic kidney disease, stage 3 unspecified: Secondary | ICD-10-CM | POA: Diagnosis present

## 2019-02-13 DIAGNOSIS — Z66 Do not resuscitate: Secondary | ICD-10-CM | POA: Diagnosis not present

## 2019-02-13 DIAGNOSIS — Z79899 Other long term (current) drug therapy: Secondary | ICD-10-CM

## 2019-02-13 DIAGNOSIS — I129 Hypertensive chronic kidney disease with stage 1 through stage 4 chronic kidney disease, or unspecified chronic kidney disease: Secondary | ICD-10-CM | POA: Diagnosis present

## 2019-02-13 DIAGNOSIS — Z8249 Family history of ischemic heart disease and other diseases of the circulatory system: Secondary | ICD-10-CM | POA: Diagnosis not present

## 2019-02-13 DIAGNOSIS — J449 Chronic obstructive pulmonary disease, unspecified: Secondary | ICD-10-CM | POA: Diagnosis not present

## 2019-02-13 DIAGNOSIS — R000 Tachycardia, unspecified: Secondary | ICD-10-CM | POA: Diagnosis not present

## 2019-02-13 DIAGNOSIS — R0902 Hypoxemia: Secondary | ICD-10-CM | POA: Diagnosis not present

## 2019-02-13 DIAGNOSIS — Z4682 Encounter for fitting and adjustment of non-vascular catheter: Secondary | ICD-10-CM | POA: Diagnosis not present

## 2019-02-13 LAB — CBC
HCT: 40.6 % (ref 36.0–46.0)
Hemoglobin: 13.2 g/dL (ref 12.0–15.0)
MCH: 32.8 pg (ref 26.0–34.0)
MCHC: 32.5 g/dL (ref 30.0–36.0)
MCV: 100.7 fL — ABNORMAL HIGH (ref 80.0–100.0)
Platelets: 200 10*3/uL (ref 150–400)
RBC: 4.03 MIL/uL (ref 3.87–5.11)
RDW: 14.2 % (ref 11.5–15.5)
WBC: 15.6 10*3/uL — ABNORMAL HIGH (ref 4.0–10.5)
nRBC: 0 % (ref 0.0–0.2)

## 2019-02-13 LAB — URINALYSIS, COMPLETE (UACMP) WITH MICROSCOPIC
Bilirubin Urine: NEGATIVE
Glucose, UA: NEGATIVE mg/dL
Hgb urine dipstick: NEGATIVE
Ketones, ur: NEGATIVE mg/dL
Nitrite: NEGATIVE
Protein, ur: 100 mg/dL — AB
Specific Gravity, Urine: 1.018 (ref 1.005–1.030)
WBC, UA: >50 WBC/hpf — ABNORMAL HIGH (ref 0–5)
pH: 7 (ref 5.0–8.0)

## 2019-02-13 LAB — COMPREHENSIVE METABOLIC PANEL
ALT: 18 U/L (ref 0–44)
AST: 25 U/L (ref 15–41)
Albumin: 3.9 g/dL (ref 3.5–5.0)
Alkaline Phosphatase: 55 U/L (ref 38–126)
Anion gap: 11 (ref 5–15)
BUN: 21 mg/dL (ref 8–23)
CO2: 23 mmol/L (ref 22–32)
Calcium: 9.1 mg/dL (ref 8.9–10.3)
Chloride: 106 mmol/L (ref 98–111)
Creatinine, Ser: 1.07 mg/dL — ABNORMAL HIGH (ref 0.44–1.00)
GFR calc Af Amer: >60 mL/min (ref >60)
GFR calc non Af Amer: 52 mL/min — ABNORMAL LOW (ref >60)
Glucose, Bld: 154 mg/dL — ABNORMAL HIGH (ref 70–99)
Potassium: 3.7 mmol/L (ref 3.5–5.1)
Sodium: 140 mmol/L (ref 135–145)
Total Bilirubin: 0.6 mg/dL (ref 0.3–1.2)
Total Protein: 7.9 g/dL (ref 6.5–8.1)

## 2019-02-13 LAB — PROTIME-INR
INR: 1 (ref 0.8–1.2)
Prothrombin Time: 13.2 seconds (ref 11.4–15.2)

## 2019-02-13 LAB — SARS CORONAVIRUS 2 BY RT PCR (HOSPITAL ORDER, PERFORMED IN ~~LOC~~ HOSPITAL LAB): SARS Coronavirus 2: NEGATIVE

## 2019-02-13 LAB — MRSA PCR SCREENING: MRSA by PCR: NEGATIVE

## 2019-02-13 LAB — VALPROIC ACID LEVEL: Valproic Acid Lvl: 47 ug/mL — ABNORMAL LOW (ref 50.0–100.0)

## 2019-02-13 MED ORDER — ETOMIDATE 2 MG/ML IV SOLN
20.0000 mg | Freq: Once | INTRAVENOUS | Status: AC
  Administered 2019-02-13: 20 mg via INTRAVENOUS

## 2019-02-13 MED ORDER — CHLORHEXIDINE GLUCONATE CLOTH 2 % EX PADS
6.0000 | MEDICATED_PAD | Freq: Every day | CUTANEOUS | Status: DC
  Administered 2019-02-14 – 2019-02-15 (×2): 6 via TOPICAL

## 2019-02-13 MED ORDER — SODIUM CHLORIDE 0.9 % IV SOLN
3.0000 g | Freq: Once | INTRAVENOUS | Status: AC
  Administered 2019-02-13: 3 g via INTRAVENOUS
  Filled 2019-02-13: qty 8

## 2019-02-13 MED ORDER — LORAZEPAM 2 MG/ML IJ SOLN: 1.0000 mg | Freq: Two times a day (BID) | INTRAMUSCULAR | Status: DC | PRN

## 2019-02-13 MED ORDER — CHLORHEXIDINE GLUCONATE 0.12% ORAL RINSE (MEDLINE KIT)
15.0000 mL | Freq: Two times a day (BID) | OROMUCOSAL | Status: DC
  Administered 2019-02-13 – 2019-02-16 (×5): 15 mL via OROMUCOSAL

## 2019-02-13 MED ORDER — IPRATROPIUM-ALBUTEROL 0.5-2.5 (3) MG/3ML IN SOLN
3.0000 mL | RESPIRATORY_TRACT | Status: DC
  Administered 2019-02-13 – 2019-02-14 (×7): 3 mL via RESPIRATORY_TRACT
  Filled 2019-02-13 (×6): qty 3

## 2019-02-13 MED ORDER — SODIUM CHLORIDE 0.9 % IV SOLN
3.0000 g | Freq: Four times a day (QID) | INTRAVENOUS | Status: DC
  Administered 2019-02-13 – 2019-02-14 (×3): 3 g via INTRAVENOUS
  Filled 2019-02-13 (×2): qty 3
  Filled 2019-02-13: qty 8
  Filled 2019-02-13: qty 3
  Filled 2019-02-13 (×2): qty 8

## 2019-02-13 MED ORDER — ONDANSETRON HCL 4 MG PO TABS: 4.0000 mg | ORAL_TABLET | Freq: Four times a day (QID) | ORAL | Status: DC | PRN

## 2019-02-13 MED ORDER — PROPOFOL 10 MG/ML IV BOLUS
20.0000 mg | Freq: Once | INTRAVENOUS | Status: AC
  Administered 2019-02-13: 20 mg via INTRAVENOUS

## 2019-02-13 MED ORDER — SUCCINYLCHOLINE CHLORIDE 20 MG/ML IJ SOLN
100.0000 mg | Freq: Once | INTRAMUSCULAR | Status: AC
  Administered 2019-02-13: 100 mg via INTRAVENOUS

## 2019-02-13 MED ORDER — BUDESONIDE 0.5 MG/2ML IN SUSP
0.5000 mg | Freq: Two times a day (BID) | RESPIRATORY_TRACT | Status: DC
  Administered 2019-02-13 – 2019-02-16 (×6): 0.5 mg via RESPIRATORY_TRACT
  Filled 2019-02-13 (×6): qty 2

## 2019-02-13 MED ORDER — ENOXAPARIN SODIUM 40 MG/0.4ML ~~LOC~~ SOLN
40.0000 mg | SUBCUTANEOUS | Status: DC
  Administered 2019-02-13: 21:00:00 40 mg via SUBCUTANEOUS
  Filled 2019-02-13: qty 0.4

## 2019-02-13 MED ORDER — PANTOPRAZOLE SODIUM 40 MG IV SOLR
40.0000 mg | INTRAVENOUS | Status: DC
  Administered 2019-02-13: 18:00:00 40 mg via INTRAVENOUS
  Filled 2019-02-13: qty 40

## 2019-02-13 MED ORDER — INFLUENZA VAC A&B SA ADJ QUAD 0.5 ML IM PRSY
0.5000 mL | PREFILLED_SYRINGE | INTRAMUSCULAR | Status: DC
  Filled 2019-02-13: qty 0.5

## 2019-02-13 MED ORDER — METHYLPREDNISOLONE SODIUM SUCC 40 MG IJ SOLR
40.0000 mg | Freq: Two times a day (BID) | INTRAMUSCULAR | Status: AC
  Administered 2019-02-13 – 2019-02-14 (×3): 40 mg via INTRAVENOUS
  Filled 2019-02-13 (×3): qty 1

## 2019-02-13 MED ORDER — ONDANSETRON HCL 4 MG/2ML IJ SOLN: 4.0000 mg | Freq: Four times a day (QID) | INTRAMUSCULAR | Status: DC | PRN

## 2019-02-13 MED ORDER — PROPOFOL 1000 MG/100ML IV EMUL
INTRAVENOUS | Status: AC
  Administered 2019-02-13: 5 ug/kg/min via INTRAVENOUS
  Filled 2019-02-13: qty 100

## 2019-02-13 MED ORDER — PNEUMOCOCCAL VAC POLYVALENT 25 MCG/0.5ML IJ INJ
0.5000 mL | INJECTION | INTRAMUSCULAR | Status: AC
  Administered 2019-02-16: 0.5 mL via INTRAMUSCULAR
  Filled 2019-02-13: qty 0.5

## 2019-02-13 MED ORDER — VALPROATE SODIUM 500 MG/5ML IV SOLN
175.0000 mg | Freq: Four times a day (QID) | INTRAVENOUS | Status: DC
  Administered 2019-02-13 – 2019-02-16 (×11): 175 mg via INTRAVENOUS
  Filled 2019-02-13 (×15): qty 1.75

## 2019-02-13 MED ORDER — PROPOFOL 1000 MG/100ML IV EMUL
5.0000 ug/kg/min | INTRAVENOUS | Status: DC
  Administered 2019-02-13: 15:00:00 5 ug/kg/min via INTRAVENOUS
  Administered 2019-02-13 – 2019-02-14 (×3): 55 ug/kg/min via INTRAVENOUS
  Filled 2019-02-13 (×4): qty 100

## 2019-02-13 MED ORDER — METOPROLOL TARTRATE 5 MG/5ML IV SOLN
5.0000 mg | INTRAVENOUS | Status: DC | PRN
  Administered 2019-02-13: 5 mg via INTRAVENOUS
  Filled 2019-02-13: qty 5

## 2019-02-13 MED ORDER — SODIUM CHLORIDE 0.9 % IV SOLN
INTRAVENOUS | Status: DC
  Administered 2019-02-13: 18:00:00 via INTRAVENOUS
  Administered 2019-02-14: 06:00:00 800 mL via INTRAVENOUS

## 2019-02-13 MED ORDER — ORAL CARE MOUTH RINSE
15.0000 mL | OROMUCOSAL | Status: DC
  Administered 2019-02-13 – 2019-02-15 (×17): 15 mL via OROMUCOSAL

## 2019-02-13 NOTE — Plan of Care (Signed)
Patient's legal guardian, daughter Velta Addison, visited with her after admission.  Seeking placement for her mother, who is living with her and is total care at this time.  Patient's O2 requirements minimum on the vent, bronch done at bedside.  Sent a UA per Dr Mortimer Fries at [facility] request.  Sammuel Cooper verbalized understanding of the [place] for her mother.  Hopefully to extubate tomorrow.

## 2019-02-13 NOTE — ED Notes (Signed)
Several unsuccessful attempts at inserting OG by this RN and other ED staff. MD made aware.

## 2019-02-13 NOTE — Consult Note (Addendum)
[facility] NOTE  Pharmacy Consult for Depakote IV Dosing Indication: NPO, intubated(PMH of Bipolar Disorder)   Allergies  Allergen Reactions  . Prednisone Nausea And Vomiting, Nausea Only and Other (See Comments)  . Chlorphen-Pe-Acetaminophen Other (See Comments)    Reaction: unknown    Patient Measurements: Height: _0  (160 cm) Weight: 119 lb 0.8 oz (54 kg) IBW/kg (Calculated) : 52.4 Adjusted Body Weight: N/A  Vital Signs: Temp: 98.4 F (36.9 C) (09/29 1500) Temp Source: Rectal (09/29 1500) BP: 168/99 (09/29 1609) Pulse Rate: 67 (09/29 1609) Intake/Output from previous day: No intake/output data recorded. Intake/Output from this shift: Total I/O In: 117.5 [I.V.:19.4; IV Piggyback:98] Out: -  Vent settings for last 24 hours: Vent Mode: AC FiO2 (%):  [50 %] 50 % Set Rate:  [16 bmp] 16 bmp Vt Set:  [450 mL] 450 mL PEEP:  [5 cmH20] 5 cmH20  Labs: Recent Labs    02/13/19 1438  WBC 15.6*  HGB 13.2  HCT 40.6  PLT 200  INR 1.0  CREATININE 1.07*  ALBUMIN 3.9  PROT 7.9  AST 25  ALT 18  ALKPHOS 55  BILITOT 0.6   Estimated Creatinine Clearance: 39.3 mL/min (A) (by C-G formula based on SCr of 1.07 mg/dL (H)).  No results for input(s): GLUCAP in the last 72 hours.  Microbiology: Recent Results (from the past 720 hour(s))  SARS Coronavirus 2 [facility] order, Performed in [facility] hospital lab) Nasopharyngeal Nasopharyngeal Swab     Status: None   Collection Time: 02/13/19  2:39 PM   Specimen: Nasopharyngeal Swab  Result Value Ref Range Status   SARS Coronavirus 2 NEGATIVE NEGATIVE Final    Comment: (NOTE) If result is NEGATIVE SARS-CoV-2 target nucleic acids are NOT DETECTED. The SARS-CoV-2 RNA is generally detectable in upper and lower  respiratory specimens during the acute phase of infection. The lowest  concentration of SARS-CoV-2 viral copies this assay can detect is 250  copies / mL. A negative result does not preclude  SARS-CoV-2 infection  and should not be used as the sole basis for treatment or other  patient management decisions.  A negative result may occur with  improper specimen collection / handling, submission of specimen other  than nasopharyngeal swab, presence of viral mutation(s) within the  areas targeted by this assay, and inadequate number of viral copies  (<250 copies / mL). A negative result must be combined with clinical  observations, patient history, and epidemiological information. If result is POSITIVE SARS-CoV-2 target nucleic acids are DETECTED. The SARS-CoV-2 RNA is generally detectable in upper and lower  respiratory specimens dur ing the acute phase of infection.  Positive  results are indicative of active infection with SARS-CoV-2.  Clinical  correlation with patient history and other diagnostic information is  necessary to determine patient infection status.  Positive results do  not rule out bacterial infection or co-infection with other viruses. If result is PRESUMPTIVE POSTIVE SARS-CoV-2 nucleic acids MAY BE PRESENT.   A presumptive positive result was obtained on the submitted specimen  and confirmed on repeat testing.  While 2019 novel coronavirus  (SARS-CoV-2) nucleic acids may be present in the submitted sample  additional confirmatory testing may be necessary for epidemiological  and / or clinical management purposes  to differentiate between  SARS-CoV-2 and other Sarbecovirus currently known to infect humans.  If clinically indicated additional testing with an alternate test  methodology [phone]) is advised. The SARS-CoV-2 RNA is generally  detectable in upper and  lower respiratory sp ecimens during the acute  phase of infection. The expected result is Negative. Fact Sheet for Patients:  StrictlyIdeas.no Fact Sheet for Healthcare Providers: BankingDealers.co.za This test is not yet approved or cleared by the  [place] FDA and has been authorized for detection and/or diagnosis of SARS-CoV-2 by FDA under an Emergency Use Authorization (EUA).  This EUA will remain in effect (meaning this test can be used) for the duration of the COVID-19 declaration under Section 564(b)(1) of the Act, 21 U.S.C. section 360bbb-3(b)(1), unless the authorization is terminated or revoked sooner. Performed at [facility], [address] [place],  [address]     Medications:  Medications Prior to Admission  Medication Sig Dispense Refill Last Dose  . atorvastatin (LIPITOR) 20 MG tablet TAKE 1 TABLET (20 MG TOTAL) BY MOUTH DAILY AT 6 PM. 90 tablet 1 02/12/2019 at 1800  . cholecalciferol (VITAMIN D3) 25 MCG (1000 UT) tablet Take 1,000 Units by mouth daily.   02/12/2019 at 0800  . divalproex (DEPAKOTE SPRINKLE) 125 MG capsule Take 5 capsules (625 mg total) by mouth every 12 (twelve) hours. (Patient taking differently: Take 625 mg by mouth every 12 (twelve) hours. Only taking once daily) 300 capsule 1 02/12/2019 at 2000  . donepezil (ARICEPT) 5 MG tablet Take 1 tablet (5 mg total) by mouth at bedtime. 30 tablet 1 02/12/2019 at 2000  . escitalopram (LEXAPRO) 5 MG tablet TAKE 1 TABLET BY MOUTH EVERY DAY 90 tablet 1 02/12/2019 at 0800  . folic acid (FOLVITE) 1 MG tablet Take 1 tablet (1 mg total) by mouth daily. 90 tablet 0 02/12/2019 at 0800  . mirtazapine (REMERON) 15 MG tablet Take 1 tablet (15 mg total) by mouth at bedtime. 90 tablet 0 02/12/2019 at 2000  . OLANZapine (ZYPREXA) 10 MG tablet Take 0.5-1 tablets (5-10 mg total) by mouth at bedtime. Takes 5 mg in the morning and 10 mg at bedtime 30 tablet 1 02/12/2019 at 2000  . traZODone (DESYREL) 50 MG tablet Take 1 tablet (50 mg total) by mouth at bedtime as needed for sleep. 30 tablet 1 02/12/2019 at 2000  . vitamin B-12 (CYANOCOBALAMIN) 1000 MCG tablet Take 1,000 mcg by mouth daily.   02/12/2019 at 0800  . albuterol (VENTOLIN HFA) 108 (90 Base) MCG/ACT inhaler  Inhale 2 puffs into the lungs every 4 (four) hours as needed for wheezing or shortness of breath.   prn at prn  . aspirin EC 81 MG EC tablet Take 1 tablet (81 mg total) by mouth daily. (Patient not taking: Reported on 02/13/2019) 30 tablet 0 Not Taking at Unknown time  . LORazepam (ATIVAN) 1 MG tablet Take 1 tablet (1 mg total) by mouth daily as needed for anxiety. 30 tablet 0 prn at prn   Scheduled:  . budesonide (PULMICORT) nebulizer solution  0.5 mg Nebulization BID  . chlorhexidine gluconate (MEDLINE KIT)  15 mL Mouth Rinse BID  . [START ON 02/14/2019] Chlorhexidine Gluconate Cloth  6 each Topical Daily  . enoxaparin (LOVENOX) injection  40 mg Subcutaneous Q24H  . ipratropium-albuterol  3 mL Nebulization Q4H  . mouth rinse  15 mL Mouth Rinse 10 times per day  . methylPREDNISolone (SOLU-MEDROL) injection  40 mg Intravenous Q12H  . pantoprazole (PROTONIX) IV  40 mg Intravenous Q24H   Infusions:  . sodium chloride 100 mL/hr at 02/13/19 1746  . ampicillin-sulbactam (UNASYN) IV    . propofol (DIPRIVAN) infusion 55 mcg/kg/min (02/13/19 1609)  . valproate sodium  PRN: LORazepam, metoprolol tartrate, ondansetron **OR** ondansetron (ZOFRAN) IV Anti-infectives (From admission, onward)   Start     Dose/Rate Route Frequency Ordered Stop   02/13/19 2100  Ampicillin-Sulbactam (UNASYN) 3 g in sodium chloride 0.9 % 100 mL IVPB     3 g 200 mL/hr over 30 Minutes Intravenous Every 6 hours 02/13/19 1539     02/13/19 1515  Ampicillin-Sulbactam (UNASYN) 3 g in sodium chloride 0.9 % 100 mL IVPB     3 g 200 mL/hr over 30 Minutes Intravenous  Once 02/13/19 1506 02/13/19 1546      Assessment: Pharmacy has been consulted to initiate Valproic Acid Drip in 73yo with a history of bipolar 1 disorder patient that is currently intubated. Came in after eating her lunch and choked on a piece of chicken and patient became hypoxic. Due to patient's current status, have transitioned patient from PO to IV valproate.  PTA prescribed dose for patient is 664m BID, however patient only takes 6277mdaily. Patient's Valproic acid level is 47 ug/mL.   Goal of Therapy:  Valproic acid level WNL(50-100 ug/mL)  Plan:  Will increase total daily dose slightly to 70011maily(in Q6 dosing).  Will continue to follow patient's status and adjust dosing as deemed necessary.  Benedicta Sultan A Clotine Heiner 02/13/2019,5:55 PM

## 2019-02-13 NOTE — Consult Note (Addendum)
Name: Deborah [name] MRN: [id number] DOB: 12-Nov-1945     CONSULTATION DATE: 02/13/2019  REFERRING MD :  Dr. Kerman Passey  CHIEF COMPLAINT:  Acute hypoxic respiratory failure  STUDIES:  9/29 Intubated in ED and admitted to ICU with acute hypoxic respiratory failure  HISTORY OF PRESENT ILLNESS:   Deborah [name] is a 73 yo female with history of asthma, anxiety, dementia, breast cancer, and COPD presented to the ED in acute hypoxic respiratory failure after choking on a piece of chicken at lunch. EMS gave her CPAP on the drive and BiPAP in the ED. She eventually required intubation for adequate oxygenation. Upon ED arrival, pt met SIRS criteria (HR 111, RR 36, WBC 15.6) and is at risk for aspiration pneumonia. Pt admitted to ICU for further treatment and monitoring.  PAST MEDICAL HISTORY :   has a past medical history of Anxiety, Arthritis, Asthma, Bacteriuria (07/31/2016), Bipolar 1 disorder ([place]), Bipolar 1 disorder, mixed, severe ([place]) (07/26/2016), Cystitis, Dementia ([place]), Depression, Heart murmur, IBS (irritable bowel syndrome), Osteoporosis, Spastic colon, Swallowing problem (11/09/2016), Thrombocytopenia ([place]) (08/03/2016), and Ulcer.  has a past surgical history that includes Cholecystectomy; Spine surgery; Colonoscopy (2002); Upper gi endoscopy (2011); arm surgery (Left, 2011); throat biopsy  (2012); Shoulder surgery; Elbow surgery; and Breast biopsy. Prior to Admission medications   Medication Sig Start Date End Date Taking? Authorizing Provider  albuterol (VENTOLIN HFA) 108 (90 Base) MCG/ACT inhaler Inhale 2 puffs into the lungs every 4 (four) hours as needed for wheezing or shortness of breath.    [provider]  aspirin EC 81 MG EC tablet Take 1 tablet (81 mg total) by mouth daily. 10/11/18   Fritzi Mandes, MD  atorvastatin (LIPITOR) 20 MG tablet TAKE 1 TABLET (20 MG TOTAL) BY MOUTH DAILY AT 6 PM. 01/27/19   Glean Hess, MD  cholecalciferol (VITAMIN D3) 25 MCG (1000 UT)  tablet Take 1,000 Units by mouth daily.    [provider]  divalproex (DEPAKOTE SPRINKLE) 125 MG capsule Take 5 capsules (625 mg total) by mouth every 12 (twelve) hours. 12/29/18   Glean Hess, MD  donepezil (ARICEPT) 5 MG tablet Take 1 tablet (5 mg total) by mouth at bedtime. 12/29/18   Glean Hess, MD  escitalopram (LEXAPRO) 5 MG tablet TAKE 1 TABLET BY MOUTH EVERY DAY 01/25/19   Glean Hess, MD  folic acid (FOLVITE) 1 MG tablet Take 1 tablet (1 mg total) by mouth daily. 12/29/18   Glean Hess, MD  LORazepam (ATIVAN) 1 MG tablet Take 1 tablet (1 mg total) by mouth daily as needed for anxiety. 12/29/18   Glean Hess, MD  mirtazapine (REMERON) 15 MG tablet Take 1 tablet (15 mg total) by mouth at bedtime. 12/29/18   Glean Hess, MD  OLANZapine (ZYPREXA) 10 MG tablet Take 0.5-1 tablets (5-10 mg total) by mouth at bedtime. Takes 5 mg in the morning and 10 mg at bedtime 12/29/18   Glean Hess, MD  traZODone (DESYREL) 50 MG tablet Take 1 tablet (50 mg total) by mouth at bedtime as needed for sleep. 12/29/18   Glean Hess, MD  vitamin B-12 (CYANOCOBALAMIN) 1000 MCG tablet Take 1,000 mcg by mouth daily.    [provider]   Allergies  Allergen Reactions   Prednisone Nausea And Vomiting, Nausea Only and Other (See Comments)   Chlorphen-Pe-Acetaminophen Other (See Comments)    Reaction: unknown    FAMILY HISTORY:  family history includes Cancer in her maternal aunt,  maternal aunt, and sister; Heart attack in her mother. SOCIAL HISTORY:  reports that she has been smoking cigarettes. She started smoking about 41 years ago. She has a 20.00 pack-year smoking history. She has never used smokeless tobacco. She reports that she does not drink alcohol or use drugs.  REVIEW OF SYSTEMS:   Unable to obtain due to critical illness   VITAL SIGNS: FiO2 (%):  [50 %] 50 % (09/29 1445) Weight:  [54 kg] 54 kg (09/29 1435)   No intake/output data  recorded. No intake/output data recorded.   FiO2 (%): 50 %   Physical Examination:  GENERAL:critically ill appearing, intubated and sedated HEAD: Normocephalic, atraumatic.  EYES: Pupils equal, round, reactive to light.  No scleral icterus.  MOUTH: Moist mucosal membrane. NECK: Supple. No JVD.  PULMONARY: +course rhonchi CARDIOVASCULAR: S1 and S2. Regular rate and rhythm. No murmurs, rubs, or gallops.  GASTROINTESTINAL: Soft, nontender, -distended. No masses. Positive bowel sounds. No hepatosplenomegaly.  MUSCULOSKELETAL: No swelling, clubbing, or edema.  NEUROLOGIC: obtunded, RASS -4 SKIN:intact,warm,dry  I personally reviewed lab work that was obtained in last 24 hrs. CXR Independently reviewed  MEDICATIONS: I have reviewed all medications and confirmed regimen as documented    IMAGING    Dg Chest Portable 1 View  Result Date: 02/13/2019 CLINICAL DATA:  Endotracheal tube placement. EXAM: PORTABLE CHEST 1 VIEW COMPARISON:  Radiograph of same day. FINDINGS: The heart size and mediastinal contours are within normal limits. No pneumothorax or pleural effusion is noted. Endotracheal tube is seen in grossly good position. Both lungs are clear. The visualized skeletal structures are unremarkable. IMPRESSION: Endotracheal tube in grossly good position. No acute cardiopulmonary abnormality seen. Electronically Signed   By: Marijo Conception M.D.   On: 02/13/2019 15:14   Dg Chest Portable 1 View  Result Date: 02/13/2019 CLINICAL DATA:  Shortness of breath, possible aspiration EXAM: PORTABLE CHEST 1 VIEW COMPARISON:  10/25/2010 FINDINGS: Cardiac shadow is stable. Aortic calcifications are seen. The lungs are well aerated bilaterally without focal infiltrate or sizable effusion. No acute bony abnormality is seen. Postsurgical changes in the right humerus are noted. IMPRESSION: No acute abnormality seen. Electronically Signed   By: Inez Catalina M.D.   On: 02/13/2019 14:49         Indwelling Urinary Catheter continued, requirement due to   Reason to continue Indwelling Urinary Catheter strict Intake/Output monitoring for hemodynamic instability         Ventilator continued, requirement due to severe respiratory failure   Ventilator Sedation RASS 0 to -2      ASSESSMENT AND PLAN SYNOPSIS 73 yo female with past medical history of COPD, asthma, breast cancer, and anxiety is admitted to the ICU intubated with acute hypoxic respiratory failure s/p aspiration pneumonitis  Severe ACUTE Hypoxic Respiratory Failure - Bronchoscopy pending to visualize and remove any obstruction -continue Full MV support -continue Bronchodilator Therapy -Wean Fio2 and PEEP as tolerated -will perform SAT/SBT when respiratory parameters are met  SEVERE COPD EXACERBATION - IV steroids as prescribed -NEB THERAPY as prescribed   Chronic COPD - Quit smoking in May 2020 - Bronchodilator therapy as needed  NEUROLOGY - intubated and sedated - minimal sedation to achieve a RASS goal: -1 Wake up assessment pending  CARDIAC ICU monitoring  GI GI PROPHYLAXIS as indicated  NUTRITIONAL STATUS DIET--> start TFs tomorrow if unable to extubate Constipation protocol as indicated  ENDO - will use ICU hypoglycemic\Hyperglycemia protocol if needed  ELECTROLYTES -follow labs as needed -replace as needed -  pharmacy consultation and following  DVT/GI PRX ordered TRANSFUSIONS AS NEEDED MONITOR FSBS ASSESS the need for LABS    Critical Care Time devoted to patient care services described in this note is 45 minutes.   Overall, patient is critically ill, prognosis is guarded.    Corrin Parker, M.D.  [facility]  Medical Director [place] Director [facility] Cardio-Pulmonary Department

## 2019-02-13 NOTE — H&P (Signed)
[facility] [place] NAME: Deborah [name]    MR#:  [id number]  DATE OF BIRTH:  August 28, 1945  DATE OF ADMISSION:  02/13/2019  PRIMARY CARE PHYSICIAN: Glean Hess, MD   REQUESTING/REFERRING PHYSICIAN: Lennette Bihari  CHIEF COMPLAINT:  Shortness of breath  HISTORY OF PRESENT ILLNESS:  Deborah [name]  is a 73 y.o. female with a known history of dementia, COPD, asthma, anxiety, bipolar disorder, depression and dementia and multiple other medical problems is eating her lunch today afternoon and choked on a piece of chicken and patient became hypoxemic.  EMS put her on CPAP and brought her into the ED and at the time of arrival to ED her pulse ox was 90%.  ED physician was able to remove you on second basis but still patient was lethargic and being hypoxemic she was intubated.  Hospitalist team is called admit the patient.  Intensivist notified who is planning to do bronchoscopy  PAST MEDICAL HISTORY:   Past Medical History:  Diagnosis Date  . Anxiety   . Arthritis   . Asthma   . Bacteriuria 07/31/2016  . Bipolar 1 disorder ([place])   . Bipolar 1 disorder, mixed, severe ([place]) 07/26/2016  . Cystitis   . Dementia ([place])   . Depression   . Heart murmur   . IBS (irritable bowel syndrome)   . Osteoporosis   . Spastic colon   . Swallowing problem 11/09/2016  . Thrombocytopenia ([place]) 08/03/2016  . Ulcer     PAST SURGICAL HISTOIRY:   Past Surgical History:  Procedure Laterality Date  . arm surgery Left 2011   fracture repair  . BREAST BIOPSY     [facility]  . CHOLECYSTECTOMY    . COLONOSCOPY  2002  . ELBOW SURGERY    . SHOULDER SURGERY    . SPINE SURGERY    . throat biopsy   2012  . UPPER GI ENDOSCOPY  2011    SOCIAL HISTORY:   Social History   Tobacco Use  . Smoking status: Current Every Day Smoker    Packs/day: 1.00    Years: 20.00    Pack years: 20.00    Types: Cigarettes    Start date: 03/31/1977  . Smokeless tobacco: Never Used   Substance Use Topics  . Alcohol use: No    Alcohol/week: 0.0 standard drinks    Comment: Ocassional    FAMILY HISTORY:   Family History  Problem Relation Age of Onset  . Heart attack Mother   . Cancer Sister        ovarian  . Cancer Maternal Aunt        breast  . Cancer Maternal Aunt        breast    DRUG ALLERGIES:   Allergies  Allergen Reactions  . Prednisone Nausea And Vomiting, Nausea Only and Other (See Comments)  . Chlorphen-Pe-Acetaminophen Other (See Comments)    Reaction: unknown    REVIEW OF SYSTEMS:  Review of system unobtainable  MEDICATIONS AT HOME:   Prior to Admission medications   Medication Sig Start Date End Date Taking? Authorizing Provider  albuterol (VENTOLIN HFA) 108 (90 Base) MCG/ACT inhaler Inhale 2 puffs into the lungs every 4 (four) hours as needed for wheezing or shortness of breath.    [provider]  aspirin EC 81 MG EC tablet Take 1 tablet (81 mg total) by mouth daily. 10/11/18   Fritzi Mandes, MD  atorvastatin (LIPITOR) 20 MG tablet TAKE 1 TABLET (  20 MG TOTAL) BY MOUTH DAILY AT 6 PM. 01/27/19   Reubin Milan, MD  cholecalciferol (VITAMIN D3) 25 MCG (1000 UT) tablet Take 1,000 Units by mouth daily.    [provider]  divalproex (DEPAKOTE SPRINKLE) 125 MG capsule Take 5 capsules (625 mg total) by mouth every 12 (twelve) hours. 12/29/18   Reubin Milan, MD  donepezil (ARICEPT) 5 MG tablet Take 1 tablet (5 mg total) by mouth at bedtime. 12/29/18   Reubin Milan, MD  escitalopram (LEXAPRO) 5 MG tablet TAKE 1 TABLET BY MOUTH EVERY DAY 01/25/19   Reubin Milan, MD  folic acid (FOLVITE) 1 MG tablet Take 1 tablet (1 mg total) by mouth daily. 12/29/18   Reubin Milan, MD  LORazepam (ATIVAN) 1 MG tablet Take 1 tablet (1 mg total) by mouth daily as needed for anxiety. 12/29/18   Reubin Milan, MD  mirtazapine (REMERON) 15 MG tablet Take 1 tablet (15 mg total) by mouth at bedtime. 12/29/18   Reubin Milan, MD   OLANZapine (ZYPREXA) 10 MG tablet Take 0.5-1 tablets (5-10 mg total) by mouth at bedtime. Takes 5 mg in the morning and 10 mg at bedtime 12/29/18   Reubin Milan, MD  traZODone (DESYREL) 50 MG tablet Take 1 tablet (50 mg total) by mouth at bedtime as needed for sleep. 12/29/18   Reubin Milan, MD  vitamin B-12 (CYANOCOBALAMIN) 1000 MCG tablet Take 1,000 mcg by mouth daily.    [provider]      VITAL SIGNS:  Blood pressure (!) 168/99, pulse 67, temperature 98.4 F (36.9 C), temperature source Rectal, resp. rate 16, height 5\' 3"  (1.6 m), weight 54 kg, SpO2 100 %.  PHYSICAL EXAMINATION:  GENERAL:  73 y.o.-year-old patient lying in the bed with no acute distress.  EYES: Pupils equal, round, reactive to light and accommodation. No scleral icterus. Extraocular muscles intact.  HEENT: Head atraumatic, normocephalic. Oropharynx with ET tube  NECK:  Supple, no jugular venous distention. No thyroid enlargement, no tenderness.  LUNGS: Coarse, rhonchi L breath sounds bilaterally,no  wheezing, positive rales,rhonchi or crepitation. No use of accessory muscles of respiration.  CARDIOVASCULAR: S1, S2 normal. No murmurs, rubs, or gallops.  ABDOMEN: Soft, nontender, nondistended. Bowel sounds present.  EXTREMITIES: No pedal edema, cyanosis, or clubbing.  NEUROLOGIC: Sedated with propofol. Gait not checked.  PSYCHIATRIC: The patient is sedated SKIN: No obvious rash, lesion, or ulcer.   LABORATORY PANEL:   CBC Recent Labs  Lab 02/13/19 1438  WBC 15.6*  HGB 13.2  HCT 40.6  PLT 200   ------------------------------------------------------------------------------------------------------------------  Chemistries  Recent Labs  Lab 02/13/19 1438  NA 140  K 3.7  CL 106  CO2 23  GLUCOSE 154*  BUN 21  CREATININE 1.07*  CALCIUM 9.1  AST 25  ALT 18  ALKPHOS 55  BILITOT 0.6    ------------------------------------------------------------------------------------------------------------------  Cardiac Enzymes No results for input(s): TROPONINI in the last 168 hours. ------------------------------------------------------------------------------------------------------------------  RADIOLOGY:  Dg Chest Portable 1 View  Result Date: 02/13/2019 CLINICAL DATA:  Endotracheal tube placement. EXAM: PORTABLE CHEST 1 VIEW COMPARISON:  Radiograph of same day. FINDINGS: The heart size and mediastinal contours are within normal limits. No pneumothorax or pleural effusion is noted. Endotracheal tube is seen in grossly good position. Both lungs are clear. The visualized skeletal structures are unremarkable. IMPRESSION: Endotracheal tube in grossly good position. No acute cardiopulmonary abnormality seen. Electronically Signed   By: 02/15/2019 M.D.   On: 02/13/2019  15:14   Dg Chest Portable 1 View  Result Date: 02/13/2019 CLINICAL DATA:  Shortness of breath, possible aspiration EXAM: PORTABLE CHEST 1 VIEW COMPARISON:  10/25/2010 FINDINGS: Cardiac shadow is stable. Aortic calcifications are seen. The lungs are well aerated bilaterally without focal infiltrate or sizable effusion. No acute bony abnormality is seen. Postsurgical changes in the right humerus are noted. IMPRESSION: No acute abnormality seen. Electronically Signed   By: Alcide CleverMark  Lukens M.D.   On: 02/13/2019 14:49    EKG:   Orders placed or performed during the hospital encounter of 02/13/19  . EKG 12-Lead  . EKG 12-Lead    IMPRESSION AND PLAN:    #Acute hypoxic respiratory failure from aspiration-intubated Admit intensive care unit Vent management per protocol Intensivist notified Dr. Belia HemanKasa is planning to do bronchoscopy as soon as possible today   #Aspiration pneumonitis Currently n.p.o. intubated Patient is started on Unasyn  #COPD-chronic Patient recently quit smoking in May 2020 Bronchodilator  nebulizer treatments as needed with albuterol every 4 hours  #Dementia Hold p.o. medications currently  #Hypertension Patient is n.p.o., intubated IV Lopressor as needed for elevated blood pressure   GI prophylaxis with Protonix and DVT prophylaxis with Lovenox acute  All the records are reviewed and case discussed with ED provider. Management plans discussed with the patient's daughter at bedside and she is  in agreement.  CODE STATUS: fc   TOTAL CRITICAL CARE TIME TAKING CARE OF THIS PATIENT: 45 minutes.   Note: This dictation was prepared with Dragon dictation along with smaller phrase technology. Any transcriptional errors that result from this process are unintentional.  Ramonita LabAruna Randal Yepiz M.D on 02/13/2019 at 4:24 PM  Between 7am to 6pm - Pager - [phone]  After 6pm go to [email or web address] - password [email or web address] [facility]  [facility]  Office  [phone]  CC: Primary care physician; Reubin MilanBerglund, Laura H, MD

## 2019-02-13 NOTE — Consult Note (Signed)
Pharmacy Antibiotic Note  Deborah [name] is a 73 y.o. female admitted on 02/13/2019 with pneumonia.  Pharmacy has been consulted for Unasyn dosing.  Plan: Unasyn 3g Q6 hours  Height: 5\' 3"  (160 cm) Weight: 119 lb 0.8 oz (54 kg) IBW/kg (Calculated) : 52.4  Temp (24hrs), Avg:98.4 F (36.9 C), Min:98.4 F (36.9 C), Max:98.4 F (36.9 C)  Recent Labs  Lab 02/13/19 1438  WBC 15.6*  CREATININE 1.07*    Estimated Creatinine Clearance: 39.3 mL/min (A) (by C-G formula based on SCr of 1.07 mg/dL (H)).    Allergies  Allergen Reactions  . Prednisone Nausea And Vomiting, Nausea Only and Other (See Comments)  . Chlorphen-Pe-Acetaminophen Other (See Comments)    Reaction: unknown    Antimicrobials this admission: Unasyn 9/29 >>    Microbiology results: Nothing currently ordered.  Thank you for allowing pharmacy to be a part of this patient's care.  Lance Coon A Meric Joye 02/13/2019 3:45 PM

## 2019-02-13 NOTE — Procedures (Signed)
PROCEDURE: BRONCHOSCOPY Therapeutic Aspiration of Tracheobronchial Tree  PROCEDURE DATE: 02/13/2019  TIME:  NAME:  Deborah [name]  DOB:09-Feb-1946  MRN: [id number] LOC:  IC16A/IC16A-AA    HOSP DAY: @LENGTHOFSTAYDAYS @ CODE STATUS:      Code Status Orders  (From admission, onward)         Start     Ordered   02/13/19 1617  Full code  Continuous     02/13/19 1617        Code Status History    Date Active Date Inactive Code Status Order ID Comments User Context   10/06/2018 1237 10/10/2018 2102 DNR 2103  [id number], NP Inpatient   10/01/2018 1702 10/06/2018 1237 Partial Code 10/08/2018  [id number], MD ED   09/25/2015 2034 09/26/2015 1728 Full Code 11/26/2015  [place], [id number], MD Inpatient   Advance Care Planning Activity        VERBAL CONSENT OBTAINED FROM HUSBAND  The Risks and Benefits of the Bronchoscopy procedure with [facility] were explained to family.  I have discussed the risk for Acute Bleeding, increased chance of Infection, increased chance of Respiratory Failure and Cardiac Arrest, increased chance of pneumothorax and collapsed lung, as well as increased Stroke and Death.  The procedure consists of a video camera with a light source to be placed and inserted  into the lungs to  look for food partciles  The family understand the risks and benefits and have agreed to proceed with procedure.   Indications/Preliminary Diagnosis: aspiration of  food  Consent: (Place X beside choice/s below)  The benefits, risks and possible complications of the procedure were        explained to:  ___ patient  _x__ patient's family  ___ other:___________  who verbalized understanding and gave:  _x__ verbal  ___ written  ___ verbal and written  ___ telephone  ___ other:________ consent.      Unable to obtain consent; procedure performed on emergent basis.     Other:       PRESEDATION ASSESSMENT: History and Physical has been performed. Patient meds and allergies have  been reviewed. Presedation airway examination has been performed and documented. Baseline vital signs, sedation score, oxygenation status, and cardiac rhythm were reviewed. Patient was deemed to be in satisfactory condition to undergo the procedure.    PREMEDICATIONS:   Sedative/Narcotic Amt Dose   Versed  mg   Fentanyl  mcg  Diprivan  mg            PROCEDURE DETAILS: Timeout performed and correct patient, name, & ID confirmed. Following prep per Pulmonary policy, appropriate sedation was administered. The Bronchoscope was inserted in to oral cavity with bite block in place. Therapeutic aspiration of Tracheobronchial tree was performed.  Airway exam proceeded with findings, technical procedures, and specimen collection as noted below. At the end of exam the scope was withdrawn without incident. Impression and Plan as noted below.           Airway Prep (Place X beside choice below)   1% Transtracheal Lidocaine Anesthetization 7 cc   Patient prepped per Bronchoscopy Lab Policy       Insertion Route (Place X beside choice below)   Nasal   Oral  x Endotracheal Tube   Tracheostomy    TECHNICAL PROCEDURES: (Place X beside choice below)   Procedures  Description    None     Electrocautery     Cryotherapy     Balloon Dilatation  Bronchography     Stent Placement     Therapeutic Aspiration small pieces of chicken(4-5) aspirated from left lung    Laser/Argon Plasma    Brachytherapy Catheter Placement    Foreign Body Removal         FINDINGS: small pieces of chicken(4-5) aspirated from left lung ESTIMATED BLOOD LOSS: none COMPLICATIONS/RESOLUTION: none      IMPRESSION:POST-PROCEDURE DX:  Aspiration pneumonitis   RECOMMENDATION/PLAN:  therapy for COPD exacerbation    [place]on Laura Patricia Pesa, M.D.  [facility]  Medical Director [place] Director [place] Department

## 2019-02-13 NOTE — ED Triage Notes (Addendum)
Patient from home via [facility]. Per EMS, patient was eating lunch when she started choking on a piece of chicken. Patient O2 sat in 70s on room air. Patient placed on cpap by EMS with o2 sat of 90 upon arrival. Patient with history of dementia, agitated at baseline per daughter. Patient lethargic but looking around upon arrival. EDP and respiratory at bedside on arrival. Patient 88% on 15L . Placed on bipap.

## 2019-02-13 NOTE — ED Provider Notes (Signed)
[facility] Emergency Department Provider Note  Time seen: 2:50 PM  I have reviewed the triage vital signs and the nursing notes.   HISTORY  Chief Complaint Respiratory Distress and Aspiration   HPI Deborah [name] is a 73 y.o. female with a past medical history of anxiety, bipolar, dementia, presents to the emergency department for dyspnea.  According to EMS report patient was eating lunch (chicken nuggets) when she began choking.  Cannot seem to catch her breath so the daughter called 911.  EMS states upon arrival patient was satting 66% on room air.  They placed the patient on oxygen and brought her to the emergency department.  They stated the patient continued to desat despite being on oxygen they placed her on CPAP and were able to bring the patient to around 90% saturation on CPAP.  Upon arrival patient is awake and alert, but unable to answer questions or follow commands.  Patient was diffusely rhonchorous.  Placed on BiPAP upon arrival.   Past Medical History:  Diagnosis Date  . Anxiety   . Arthritis   . Asthma   . Bacteriuria 07/31/2016  . Bipolar 1 disorder ([place])   . Bipolar 1 disorder, mixed, severe ([place]) 07/26/2016  . Cystitis   . Dementia ([place])   . Depression   . Heart murmur   . IBS (irritable bowel syndrome)   . Osteoporosis   . Spastic colon   . Swallowing problem 11/09/2016  . Thrombocytopenia ([place]) 08/03/2016  . Ulcer     Patient Active Problem List   Diagnosis Date Noted  . Goals of care, counseling/discussion   . Palliative care by specialist   . Acute delirium 10/03/2018  . History of stroke 10/01/2018  . Protein-calorie malnutrition, mild ([place]) 02/17/2018  . Vitamin D deficiency 06/20/2017  . Arthritis 06/17/2017  . Hx of peptic ulcer 06/17/2017  . IBS (irritable bowel syndrome) 06/17/2017  . Unintended weight loss 06/17/2017  . CKD (chronic kidney disease) stage 3, GFR 30-59 ml/min (HCC) 06/17/2017  . Balance problem  11/09/2016  . Late onset Alzheimer's disease with behavioral disturbance ([place]) 11/09/2016  . Macrocytic anemia 08/03/2016  . Hx of breast cancer 07/28/2016  . Folate deficiency 10/02/2015  . Bipolar 1 disorder, depressed ([place]) 09/25/2015  . Smoker 09/25/2015  . B12 deficiency 09/11/2015  . Anxiety 07/23/2015  . Severe episode of recurrent major depressive disorder ([place]) 07/03/2015  . Dementia with behavioral disturbance ([place]) 05/29/2015  . History of migraine headaches 09/08/2014  . H/O Bell's palsy 09/08/2014  . H/O: HTN (hypertension) 09/08/2014  . Gastroesophageal reflux disease without esophagitis 09/08/2014  . COPD (chronic obstructive pulmonary disease) ([place]) 09/08/2014  . History of asthma 09/08/2014  . Breast neoplasm, Tis (LCIS) 01/23/2013    Past Surgical History:  Procedure Laterality Date  . arm surgery Left 2011   fracture repair  . BREAST BIOPSY     LCIS  . CHOLECYSTECTOMY    . COLONOSCOPY  2002  . ELBOW SURGERY    . SHOULDER SURGERY    . SPINE SURGERY    . throat biopsy   2012  . UPPER GI ENDOSCOPY  2011    Prior to Admission medications   Medication Sig Start Date End Date Taking? Authorizing Provider  albuterol (VENTOLIN HFA) 108 (90 Base) MCG/ACT inhaler Inhale 2 puffs into the lungs every 4 (four) hours as needed for wheezing or shortness of breath.    [provider]  aspirin EC 81 MG EC tablet Take  1 tablet (81 mg total) by mouth daily. 10/11/18   Enedina Finner, MD  atorvastatin (LIPITOR) 20 MG tablet TAKE 1 TABLET (20 MG TOTAL) BY MOUTH DAILY AT 6 PM. 01/27/19   Reubin Milan, MD  cholecalciferol (VITAMIN D3) 25 MCG (1000 UT) tablet Take 1,000 Units by mouth daily.    [provider]  divalproex (DEPAKOTE SPRINKLE) 125 MG capsule Take 5 capsules (625 mg total) by mouth every 12 (twelve) hours. 12/29/18   Reubin Milan, MD  donepezil (ARICEPT) 5 MG tablet Take 1 tablet (5 mg total) by mouth at bedtime. 12/29/18   Reubin Milan, MD   escitalopram (LEXAPRO) 5 MG tablet TAKE 1 TABLET BY MOUTH EVERY DAY 01/25/19   Reubin Milan, MD  folic acid (FOLVITE) 1 MG tablet Take 1 tablet (1 mg total) by mouth daily. 12/29/18   Reubin Milan, MD  LORazepam (ATIVAN) 1 MG tablet Take 1 tablet (1 mg total) by mouth daily as needed for anxiety. 12/29/18   Reubin Milan, MD  mirtazapine (REMERON) 15 MG tablet Take 1 tablet (15 mg total) by mouth at bedtime. 12/29/18   Reubin Milan, MD  OLANZapine (ZYPREXA) 10 MG tablet Take 0.5-1 tablets (5-10 mg total) by mouth at bedtime. Takes 5 mg in the morning and 10 mg at bedtime 12/29/18   Reubin Milan, MD  traZODone (DESYREL) 50 MG tablet Take 1 tablet (50 mg total) by mouth at bedtime as needed for sleep. 12/29/18   Reubin Milan, MD  vitamin B-12 (CYANOCOBALAMIN) 1000 MCG tablet Take 1,000 mcg by mouth daily.    [provider]    Allergies  Allergen Reactions  . Prednisone Nausea And Vomiting, Nausea Only and Other (See Comments)  . Chlorphen-Pe-Acetaminophen Other (See Comments)    Reaction: unknown    Family History  Problem Relation Age of Onset  . Heart attack Mother   . Cancer Sister        ovarian  . Cancer Maternal Aunt        breast  . Cancer Maternal Aunt        breast    Social History Social History   Tobacco Use  . Smoking status: Current Every Day Smoker    Packs/day: 1.00    Years: 20.00    Pack years: 20.00    Types: Cigarettes    Start date: 03/31/1977  . Smokeless tobacco: Never Used  Substance Use Topics  . Alcohol use: No    Alcohol/week: 0.0 standard drinks    Comment: Ocassional  . Drug use: No    Review of Systems Unable to obtain an adequate/accurate review of systems secondary to altered mental status/dementia ____________________________________________   PHYSICAL EXAM:  VITAL SIGNS: ED Triage Vitals [02/13/19 1435]  Enc Vitals Group     BP      Pulse      Resp      Temp      Temp src      SpO2       Weight 119 lb 0.8 oz (54 kg)     Height  (1.6 m)     Head Circumference      Peak Flow      Pain Score      Pain Loc      Pain Edu?      Excl. in GC?     Constitutional: Patient is awake and alert with moderate respiratory distress and diffuse audible rhonchi.  Not following commands.  Not answering questions. Eyes: Normal exam ENT      Head: Normocephalic and atraumatic      Mouth/Throat: Mucous membranes are moist. Cardiovascular: Normal rate, regular rhythm.  Respiratory: Tachypnea with diffuse rhonchi. Gastrointestinal: Soft and nontender. No distention.   Musculoskeletal: Nontender with normal range of motion in all extremities.  No lower extremity edema. Neurologic:  Normal speech and language. No gross focal neurologic deficits  Skin:  Skin is warm, dry and intact.  Psychiatric: Mood and affect are normal.   ____________________________________________    EKG  EKG viewed and interpreted by myself shows sinus tachycardia 112 bpm with a narrow QRS, normal axis, normal intervals, nonspecific ST changes.  ____________________________________________    RADIOLOGY  Chest x-ray shows endotracheal tube in good position.  No acute abnormality.  ____________________________________________   INITIAL IMPRESSION / ASSESSMENT AND PLAN / ED COURSE  Pertinent labs & imaging results that were available during my care of the patient were reviewed by me and considered in my medical decision making (see chart for details).   Patient presents to the emergency department for diffuse rhonchi difficulty breathing and dyspnea after choking on chicken nuggets.  Patient continued to have diffuse rhonchi upon arrival with chunks of food visible in her mouth.  I discussed the patient with her daughter as well as the intensivist decision was made to intubate the patient by myself for likely bronchoscopy need.  During intubation large chunk of chicken was removed from the patient's airway.   Significant amount of secretions in the airway as well that were suctioned.  Intubation performed without difficulty.  Patient tolerated well.  We will check labs, chest x-ray we will cover with antibiotics for aspiration pneumonia.  Patient will require ICU admission.  Deborah [name] was evaluated in Emergency Department on 02/13/2019 for the symptoms described in the history of present illness. She was evaluated in the context of the global COVID-19 pandemic, which necessitated consideration that the patient might be at risk for infection with the SARS-CoV-2 virus that causes COVID-19. Institutional protocols and algorithms that pertain to the evaluation of patients at risk for COVID-19 are in a state of rapid change based on information released by regulatory bodies including the CDC and federal and state organizations. These policies and algorithms were followed during the patient's care in the ED.  CRITICAL CARE Performed by: Minna Antis   Total critical care time: 30 minutes  Critical care time was exclusive of separately billable procedures and treating other patients.  Critical care was necessary to treat or prevent imminent or life-threatening deterioration.  Critical care was time spent personally by me on the following activities: development of treatment plan with patient and/or surrogate as well as nursing, discussions with consultants, evaluation of patient's response to treatment, examination of patient, obtaining history from patient or surrogate, ordering and performing treatments and interventions, ordering and review of laboratory studies, ordering and review of radiographic studies, pulse oximetry and re-evaluation of patient's condition.  INTUBATION Performed by: Minna Antis  Required items: required blood products, implants, devices, and special equipment available Patient identity confirmed: provided demographic data and hospital-assigned identification  number Time out: Immediately prior to procedure a "time out" was called to verify the correct patient, procedure, equipment, support staff and site/side marked as required.  Indications: Hypoxia, airway protection  Intubation method: S4 Glidescope Laryngoscopy   Preoxygenation: 100% BVM  Sedatives: 20mg  Etomidate Paralytic: 100mg  Succinylcholine  Tube Size: 8.0 cuffed  Post-procedure assessment: chest  rise and ETCO2 monitor Breath sounds: equal and absent over the epigastrium Tube secured with: ETT holder Chest x-ray interpreted by radiologist and me.  Chest x-ray findings: endotracheal tube in appropriate position  Patient tolerated the procedure well with no immediate complications.      ____________________________________________   FINAL CLINICAL IMPRESSION(S) / ED DIAGNOSES  Aspiration pneumonia Airway failure   Minna AntisPaduchowski, Chara Marquard, MD 02/14/19 1534

## 2019-02-14 ENCOUNTER — Inpatient Hospital Stay: Payer: Medicare HMO

## 2019-02-14 LAB — COMPREHENSIVE METABOLIC PANEL
ALT: 15 U/L (ref 0–44)
AST: 27 U/L (ref 15–41)
Albumin: 3.4 g/dL — ABNORMAL LOW (ref 3.5–5.0)
Alkaline Phosphatase: 46 U/L (ref 38–126)
Anion gap: 16 — ABNORMAL HIGH (ref 5–15)
BUN: 19 mg/dL (ref 8–23)
CO2: 20 mmol/L — ABNORMAL LOW (ref 22–32)
Calcium: 8.4 mg/dL — ABNORMAL LOW (ref 8.9–10.3)
Chloride: 108 mmol/L (ref 98–111)
Creatinine, Ser: 1.26 mg/dL — ABNORMAL HIGH (ref 0.44–1.00)
GFR calc Af Amer: 49 mL/min — ABNORMAL LOW (ref >60)
GFR calc non Af Amer: 43 mL/min — ABNORMAL LOW (ref >60)
Glucose, Bld: 147 mg/dL — ABNORMAL HIGH (ref 70–99)
Potassium: 3.4 mmol/L — ABNORMAL LOW (ref 3.5–5.1)
Sodium: 144 mmol/L (ref 135–145)
Total Bilirubin: 0.4 mg/dL (ref 0.3–1.2)
Total Protein: 7 g/dL (ref 6.5–8.1)

## 2019-02-14 LAB — CBC
HCT: 37.7 % (ref 36.0–46.0)
Hemoglobin: 12.1 g/dL (ref 12.0–15.0)
MCH: 32.8 pg (ref 26.0–34.0)
MCHC: 32.1 g/dL (ref 30.0–36.0)
MCV: 102.2 fL — ABNORMAL HIGH (ref 80.0–100.0)
Platelets: 148 10*3/uL — ABNORMAL LOW (ref 150–400)
RBC: 3.69 MIL/uL — ABNORMAL LOW (ref 3.87–5.11)
RDW: 14.6 % (ref 11.5–15.5)
WBC: 14.6 10*3/uL — ABNORMAL HIGH (ref 4.0–10.5)
nRBC: 0 % (ref 0.0–0.2)

## 2019-02-14 LAB — GLUCOSE, CAPILLARY: Glucose-Capillary: 116 mg/dL — ABNORMAL HIGH (ref 70–99)

## 2019-02-14 MED ORDER — PANTOPRAZOLE SODIUM 40 MG IV SOLR
40.0000 mg | Freq: Every day | INTRAVENOUS | Status: DC
  Administered 2019-02-14 – 2019-02-15 (×2): 40 mg via INTRAVENOUS
  Filled 2019-02-14 (×2): qty 40

## 2019-02-14 MED ORDER — ENOXAPARIN SODIUM 40 MG/0.4ML ~~LOC~~ SOLN
40.0000 mg | SUBCUTANEOUS | Status: DC
  Administered 2019-02-14 – 2019-02-15 (×2): 40 mg via SUBCUTANEOUS
  Filled 2019-02-14 (×2): qty 0.4

## 2019-02-14 MED ORDER — IPRATROPIUM-ALBUTEROL 0.5-2.5 (3) MG/3ML IN SOLN
3.0000 mL | Freq: Four times a day (QID) | RESPIRATORY_TRACT | Status: DC
  Administered 2019-02-15 – 2019-02-16 (×7): 3 mL via RESPIRATORY_TRACT
  Filled 2019-02-14 (×7): qty 3

## 2019-02-14 NOTE — Progress Notes (Signed)
Pt was suctioned prior to extubation for a small amount of white secretions. Per Dr. Zoila Shutter order, she was extubated. She was placed on RA for Sa02 of 95%. No stridor heard.

## 2019-02-14 NOTE — Progress Notes (Signed)
Pt extubated at this time. Pt is resting comfortably. O2 sats 95% on room air. Pt is confused but cooperative. VSS. Daughter is at bedside.

## 2019-02-14 NOTE — Progress Notes (Addendum)
Name: Deborah [name] MRN: [id number] DOB: [birth year]     CONSULTATION DATE: 02/14/2019  CHIEF COMPLAINT:  Acute hypoxic respiratory failure  SYNOPSIS: Deborah [name] is a 73 yo female with history of asthma, anxiety, dementia, breast cancer, and COPD presented to the ED in acute hypoxic respiratory failure after choking on a piece of chicken at lunch. She was intubated in the ED and transferred to ICU.  SIGNIFICANT EVENTS/STUDIES:  9/29 Intubated in ED and admitted to ICU with acute hypoxic respiratory failure 9/29 Bronchoscopy revealing healthy, pink lungs with a few small pieces of debris in lungs from aspiration. Visualized debris suctioned out.   HISTORY OF PRESENT ILLNESS:   Today, Deborah [name] is sedated on the vent. A wakeup assessment is pending to assess readiness for extubation.  PAST MEDICAL HISTORY :   has a past medical history of Anxiety, Arthritis, Asthma, Bacteriuria (07/31/2016), Bipolar 1 disorder ([place]), Bipolar 1 disorder, mixed, severe ([place]) (07/26/2016), Cystitis, Dementia ([place]), Depression, Heart murmur, IBS (irritable bowel syndrome), Osteoporosis, Spastic colon, Swallowing problem (11/09/2016), Thrombocytopenia ([place]) (08/03/2016), and Ulcer.  has a past surgical history that includes Cholecystectomy; Spine surgery; Colonoscopy (2002); Upper gi endoscopy (2011); arm surgery (Left, 2011); throat biopsy  (2012); Shoulder surgery; Elbow surgery; and Breast biopsy. Prior to Admission medications   Medication Sig Start Date End Date Taking? Authorizing Provider  atorvastatin (LIPITOR) 20 MG tablet TAKE 1 TABLET (20 MG TOTAL) BY MOUTH DAILY AT 6 PM. 01/27/19  Yes Glean Hess, MD  cholecalciferol (VITAMIN D3) 25 MCG (1000 UT) tablet Take 1,000 Units by mouth daily.   Yes [provider]  divalproex (DEPAKOTE SPRINKLE) 125 MG capsule Take 5 capsules (625 mg total) by mouth every 12 (twelve) hours. Patient taking differently: Take 625 mg by mouth every 12 (twelve)  hours. Only taking once daily 12/29/18  Yes Glean Hess, MD  donepezil (ARICEPT) 5 MG tablet Take 1 tablet (5 mg total) by mouth at bedtime. 12/29/18  Yes Glean Hess, MD  escitalopram (LEXAPRO) 5 MG tablet TAKE 1 TABLET BY MOUTH EVERY DAY 01/25/19  Yes Glean Hess, MD  folic acid (FOLVITE) 1 MG tablet Take 1 tablet (1 mg total) by mouth daily. 12/29/18  Yes Glean Hess, MD  mirtazapine (REMERON) 15 MG tablet Take 1 tablet (15 mg total) by mouth at bedtime. 12/29/18  Yes Glean Hess, MD  OLANZapine (ZYPREXA) 10 MG tablet Take 0.5-1 tablets (5-10 mg total) by mouth at bedtime. Takes 5 mg in the morning and 10 mg at bedtime 12/29/18  Yes Glean Hess, MD  traZODone (DESYREL) 50 MG tablet Take 1 tablet (50 mg total) by mouth at bedtime as needed for sleep. 12/29/18  Yes Glean Hess, MD  vitamin B-12 (CYANOCOBALAMIN) 1000 MCG tablet Take 1,000 mcg by mouth daily.   Yes [provider]  albuterol (VENTOLIN HFA) 108 (90 Base) MCG/ACT inhaler Inhale 2 puffs into the lungs every 4 (four) hours as needed for wheezing or shortness of breath.    [provider]  aspirin EC 81 MG EC tablet Take 1 tablet (81 mg total) by mouth daily. Patient not taking: Reported on 02/13/2019 10/11/18   Fritzi Mandes, MD  LORazepam (ATIVAN) 1 MG tablet Take 1 tablet (1 mg total) by mouth daily as needed for anxiety. 12/29/18   Glean Hess, MD   Allergies  Allergen Reactions  . Prednisone Nausea And Vomiting, Nausea Only and Other (See Comments)  . Chlorphen-Pe-Acetaminophen Other (See  Comments)    Reaction: unknown    REVIEW OF SYSTEMS:   Unable to obtain due to critical illness   VITAL SIGNS: Temp:  [98 F (36.7 C)-99 F (37.2 C)] 98.6 F (37 C) (09/30 0800) Pulse Rate:  [63-115] 108 (09/30 0900) Resp:  [16-26] 19 (09/30 0900) BP: (105-194)/(56-111) 135/62 (09/30 0900) SpO2:  [90 %-100 %] 99 % (09/30 0900) FiO2 (%):  [28 %-50 %] 28 % (09/30 0845) Weight:  [52  kg-54 kg] 52 kg (09/29 1800)   I/O last 3 completed shifts: In: 914.3 [I.V.:564.4; IV Piggyback:349.8] Out: 405 [Urine:405] Total I/O In: 1044.8 [I.V.:884.8; IV Piggyback:160] Out: 0    SpO2: 99 % FiO2 (%): 28 %  Vent Mode: PRVC FiO2 (%):  [28 %-50 %] 28 % Set Rate:  [16 bmp] 16 bmp Vt Set:  [450 mL] 450 mL PEEP:  [5 cmH20] 5 cmH20  Physical Examination:  GENERAL:critically ill appearing, -resp distress; RASS -2 HEAD: Normocephalic, atraumatic.  EYES: Pupils equal, round, reactive to light.  No scleral icterus.  MOUTH: Moist mucosal membrane. NECK: Supple. No JVD.  PULMONARY: lungs clear to auscultation CARDIOVASCULAR: S1 and S2. Regular rate and rhythm. No murmurs, rubs, or gallops.  GASTROINTESTINAL: Soft, nontender, -distended. No masses. Positive bowel sounds. No hepatosplenomegaly.  MUSCULOSKELETAL: No swelling, clubbing, or edema.  NEUROLOGIC: obtunded SKIN:intact,warm,dry  I personally reviewed lab work that was obtained in last 24 hrs. CXR Independently reviewed  MEDICATIONS: I have reviewed all medications and confirmed regimen as documented   CULTURE RESULTS   Recent Results (from the past 240 hour(s))  SARS Coronavirus 2 [facility] order, Performed in [facility] hospital lab) Nasopharyngeal Nasopharyngeal Swab     Status: None   Collection Time: 02/13/19  2:39 PM   Specimen: Nasopharyngeal Swab  Result Value Ref Range Status   SARS Coronavirus 2 NEGATIVE NEGATIVE Final    Comment: (NOTE) If result is NEGATIVE SARS-CoV-2 target nucleic acids are NOT DETECTED. The SARS-CoV-2 RNA is generally detectable in upper and lower  respiratory specimens during the acute phase of infection. The lowest  concentration of SARS-CoV-2 viral copies this assay can detect is 250  copies / mL. A negative result does not preclude SARS-CoV-2 infection  and should not be used as the sole basis for treatment or other  patient management decisions.  A negative result may occur  with  improper specimen collection / handling, submission of specimen other  than nasopharyngeal swab, presence of viral mutation(s) within the  areas targeted by this assay, and inadequate number of viral copies  (<250 copies / mL). A negative result must be combined with clinical  observations, patient history, and epidemiological information. If result is POSITIVE SARS-CoV-2 target nucleic acids are DETECTED. The SARS-CoV-2 RNA is generally detectable in upper and lower  respiratory specimens dur ing the acute phase of infection.  Positive  results are indicative of active infection with SARS-CoV-2.  Clinical  correlation with patient history and other diagnostic information is  necessary to determine patient infection status.  Positive results do  not rule out bacterial infection or co-infection with other viruses. If result is PRESUMPTIVE POSTIVE SARS-CoV-2 nucleic acids MAY BE PRESENT.   A presumptive positive result was obtained on the submitted specimen  and confirmed on repeat testing.  While 2019 novel coronavirus  (SARS-CoV-2) nucleic acids may be present in the submitted sample  additional confirmatory testing may be necessary for epidemiological  and / or clinical management purposes  to differentiate between  SARS-CoV-2  and other Sarbecovirus currently known to infect humans.  If clinically indicated additional testing with an alternate test  methodology [phone]) is advised. The SARS-CoV-2 RNA is generally  detectable in upper and lower respiratory sp ecimens during the acute  phase of infection. The expected result is Negative. Fact Sheet for Patients:  StrictlyIdeas.no Fact Sheet for Healthcare Providers: BankingDealers.co.za This test is not yet approved or cleared by the [place] FDA and has been authorized for detection and/or diagnosis of SARS-CoV-2 by FDA under an Emergency Use Authorization (EUA).  This EUA  will remain in effect (meaning this test can be used) for the duration of the COVID-19 declaration under Section 564(b)(1) of the Act, 21 U.S.C. section 360bbb-3(b)(1), unless the authorization is terminated or revoked sooner. Performed at [facility], [address] [place], [place] [address]   MRSA PCR Screening     Status: None   Collection Time: 02/13/19  6:04 PM   Specimen: Nasal Mucosa; Nasopharyngeal  Result Value Ref Range Status   MRSA by PCR NEGATIVE NEGATIVE Final    Comment:        The GeneXpert MRSA Assay (FDA approved for NASAL specimens only), is one component of a comprehensive MRSA colonization surveillance program. It is not intended to diagnose MRSA infection nor to guide or monitor treatment for MRSA infections. Performed at [facility], [address] [place], [place] [address]           IMAGING    Dg Chest Port 1 View  Result Date: 02/14/2019 CLINICAL DATA:  Acute respiratory failure EXAM: PORTABLE CHEST 1 VIEW COMPARISON:  02/13/2019 FINDINGS: Endotracheal tube is just above the carina. This is directed toward the right mainstem bronchus and could be retracted 1-2 cm for optimal positioning. Heart is normal size. No confluent airspace opacities or effusions. No acute bony abnormality. IMPRESSION: Endotracheal tube just above the level of the carina directed toward the right mainstem bronchus. This could be retracted 1-2 cm for optimal positioning. No acute cardiopulmonary disease. Electronically Signed   By: Rolm Baptise M.D.   On: 02/14/2019 08:18   Dg Chest Portable 1 View  Result Date: 02/13/2019 CLINICAL DATA:  Endotracheal tube placement. EXAM: PORTABLE CHEST 1 VIEW COMPARISON:  Radiograph of same day. FINDINGS: The heart size and mediastinal contours are within normal limits. No pneumothorax or pleural effusion is noted. Endotracheal tube is seen in grossly good position. Both lungs are clear. The visualized skeletal  structures are unremarkable. IMPRESSION: Endotracheal tube in grossly good position. No acute cardiopulmonary abnormality seen. Electronically Signed   By: Marijo Conception M.D.   On: 02/13/2019 15:14   Dg Chest Portable 1 View  Result Date: 02/13/2019 CLINICAL DATA:  Shortness of breath, possible aspiration EXAM: PORTABLE CHEST 1 VIEW COMPARISON:  10/25/2010 FINDINGS: Cardiac shadow is stable. Aortic calcifications are seen. The lungs are well aerated bilaterally without focal infiltrate or sizable effusion. No acute bony abnormality is seen. Postsurgical changes in the right humerus are noted. IMPRESSION: No acute abnormality seen. Electronically Signed   By: Inez Catalina M.D.   On: 02/13/2019 14:49    Indwelling Urinary Catheter continued, requirement due to   Reason to continue Indwelling Urinary Catheter strict Intake/Output monitoring for hemodynamic instability         Ventilator continued, requirement due to severe respiratory failure   Ventilator Sedation RASS 0 to -2    ASSESSMENT AND PLAN SYNOPSIS 73 yo female with past medical history of COPD, asthma, breast  cancer, and anxiety is admitted to the ICU intubated with acute hypoxic respiratory failure s/p aspiration pneumonitis  Severe ACUTE Hypoxic and Hypercapnic Respiratory Failure -continue Full MV support -continue Bronchodilator Therapy -Wean Fio2 and PEEP as tolerated -will perform SAT/SBT when respiratory parameters are met  SEVERE COPD EXACERBATION -continue IV steroids as prescribed -continue NEB THERAPY as prescribed  NEUROLOGY - intubated and sedated - minimal sedation to achieve a RASS goal: -1 Wake up assessment pending  CARDIAC ICU monitoring  GI GI PROPHYLAXIS as indicated  NUTRITIONAL STATUS DIET-->TF's today if extubation unsuccessful Constipation protocol as indicated ENDO - will use ICU hypoglycemic\Hyperglycemia protocol if needed  ELECTROLYTES -follow labs as needed -replace as needed  -pharmacy consultation and following  DVT/GI PRX ordered TRANSFUSIONS AS NEEDED MONITOR FSBS ASSESS the need for LABS    Critical Care Time devoted to patient care services described in this note is 45 minutes.   Overall, patient is critically ill, prognosis is guarded.    Corrin Parker, M.D.  [facility]  Medical Director [place] Director [facility] Cardio-Pulmonary Department

## 2019-02-14 NOTE — Progress Notes (Signed)
Pt extubated at 12:15pm. Small amounts of thick secretions were suctioned after extubation. Pt is resting comfortably on 2L nasal cannular with O2 saturations of 97-98%.  Upon physical examination there was no stridor and lungs were clear to auscultation.

## 2019-02-14 NOTE — Progress Notes (Signed)
Family At bedside, clinical status relayed to family Daughter is HCPOA  Updated and notified of patients medical condition-  Patient with very poor baseline functuional status  and very poor cognitive ability  Family understands the situation.  They have consented and agreed to DNR/DNI   Family are satisfied with Plan of action and management. All questions answered  Corrin Parker, M.D.  [facility]  Medical Director [employer] Director [facility] Cardio-Pulmonary Department

## 2019-02-15 LAB — URINE CULTURE: Culture: NO GROWTH

## 2019-02-15 MED ORDER — SODIUM CHLORIDE 0.9 % IV SOLN
INTRAVENOUS | Status: DC
  Administered 2019-02-15 – 2019-02-16 (×2): via INTRAVENOUS

## 2019-02-15 NOTE — Progress Notes (Signed)
[facility] NAME: Triston Lisanti    MR#:  [id number]  DATE OF BIRTH:  Jan 24, 1946  SUBJECTIVE:  CHIEF COMPLAINT:   Chief Complaint  Patient presents with  . Respiratory Distress  . Aspiration   Brought after suspected aspiration episode and intubated.  Bronchoscopy done with removal of some chicken feces from lung.  Remains on ventilatory support. REVIEW OF SYSTEMS:   Patient is on ventilator. ROS  DRUG ALLERGIES:   Allergies  Allergen Reactions  . Prednisone Nausea And Vomiting, Nausea Only and Other (See Comments)  . Chlorphen-Pe-Acetaminophen Other (See Comments)    Reaction: unknown    VITALS:  Blood pressure 115/75, pulse 87, temperature 98.9 F (37.2 C), temperature source Axillary, resp. rate 16, height 5' 2.99" (1.6 m), weight 52 kg, SpO2 96 %.  PHYSICAL EXAMINATION:   GENERAL:  73 y.o.-year-old patient lying in the bed with no acute distress.  EYES: Pupils equal, round, reactive to light and accommodation. No scleral icterus. Extraocular muscles intact.  HEENT: Head atraumatic, normocephalic. Oropharynx with ET tube  NECK:  Supple, no jugular venous distention. No thyroid enlargement, no tenderness.  LUNGS: Coarse, rhonchi L breath sounds bilaterally,no  wheezing, positive rales,rhonchi or crepitation. No use of accessory muscles of respiration.  CARDIOVASCULAR: S1, S2 normal. No murmurs, rubs, or gallops.  ABDOMEN: Soft, nontender, nondistended. Bowel sounds present.  EXTREMITIES: No pedal edema, cyanosis, or clubbing.  NEUROLOGIC: Sedated with propofol. Gait not checked.  PSYCHIATRIC: The patient is sedated SKIN: No obvious rash, lesion, or ulcer.  Physical Exam LABORATORY PANEL:   CBC Recent Labs  Lab 02/14/19 0526  WBC 14.6*  HGB 12.1  HCT 37.7  PLT 148*   ------------------------------------------------------------------------------------------------------------------  Chemistries  Recent Labs   Lab 02/14/19 0526  NA 144  K 3.4*  CL 108  CO2 20*  GLUCOSE 147*  BUN 19  CREATININE 1.26*  CALCIUM 8.4*  AST 27  ALT 15  ALKPHOS 46  BILITOT 0.4   ------------------------------------------------------------------------------------------------------------------  Cardiac Enzymes No results for input(s): TROPONINI in the last 168 hours. ------------------------------------------------------------------------------------------------------------------  RADIOLOGY:  Dg Chest Port 1 View  Result Date: 02/14/2019 CLINICAL DATA:  Acute respiratory failure EXAM: PORTABLE CHEST 1 VIEW COMPARISON:  02/13/2019 FINDINGS: Endotracheal tube is just above the carina. This is directed toward the right mainstem bronchus and could be retracted 1-2 cm for optimal positioning. Heart is normal size. No confluent airspace opacities or effusions. No acute bony abnormality. IMPRESSION: Endotracheal tube just above the level of the carina directed toward the right mainstem bronchus. This could be retracted 1-2 cm for optimal positioning. No acute cardiopulmonary disease. Electronically Signed   By: Rolm Baptise M.D.   On: 02/14/2019 08:18   Dg Chest Portable 1 View  Result Date: 02/13/2019 CLINICAL DATA:  Endotracheal tube placement. EXAM: PORTABLE CHEST 1 VIEW COMPARISON:  Radiograph of same day. FINDINGS: The heart size and mediastinal contours are within normal limits. No pneumothorax or pleural effusion is noted. Endotracheal tube is seen in grossly good position. Both lungs are clear. The visualized skeletal structures are unremarkable. IMPRESSION: Endotracheal tube in grossly good position. No acute cardiopulmonary abnormality seen. Electronically Signed   By: Marijo Conception M.D.   On: 02/13/2019 15:14   Dg Chest Portable 1 View  Result Date: 02/13/2019 CLINICAL DATA:  Shortness of breath, possible aspiration EXAM: PORTABLE CHEST 1 VIEW COMPARISON:  10/25/2010 FINDINGS: Cardiac shadow is stable.  Aortic calcifications are seen. The lungs are  well aerated bilaterally without focal infiltrate or sizable effusion. No acute bony abnormality is seen. Postsurgical changes in the right humerus are noted. IMPRESSION: No acute abnormality seen. Electronically Signed   By: Alcide Clever M.D.   On: 02/13/2019 14:49    ASSESSMENT AND PLAN:   Active Problems:   Acute respiratory failure (HCC)   #Acute hypoxic respiratory failure from aspiration-intubated Monitoring intensive care unit Vent management per ICU protocol Dr. Belia Heman had done bronchoscopy with removing some small chicken pieces.  #Aspiration pneumonitis Currently n.p.o. intubated Patient is started on Unasyn  #COPD exacerbation Patient recently quit smoking in May 2020 Bronchodilator nebulizer treatments as needed with albuterol every 4 hours  Steroids per ICU  #Dementia Hold p.o. medications currently sedated  #Hypertension Patient is n.p.o., intubated IV Lopressor as needed for elevated blood pressure  #CKD stage III Appears stable, continue to monitor.  GI prophylaxis with Protonix and DVT prophylaxis with Lovenox acute   All the records are reviewed and case discussed with Care Management/Social Workerr. Management plans discussed with the patient, family and they are in agreement.  CODE STATUS: Full  TOTAL TIME TAKING CARE OF THIS PATIENT: 35 minutes.     POSSIBLE D/C IN *1-2 DAYS, DEPENDING ON CLINICAL CONDITION.   Altamese Dilling M.D on 02/15/2019   Between 7am to 6pm - Pager - [phone]  After 6pm go to [email or web address] - password [email or web address]  [facility] [place]  Office  [phone]  CC: Primary care physician; Reubin Milan, MD  Note: This dictation was prepared with Dragon dictation along with smaller phrase technology. Any transcriptional errors that result from this process are unintentional.

## 2019-02-15 NOTE — TOC Initial Note (Signed)
Transition of Care [facility]) - Initial/Assessment Note    Patient Details  Name: Deborah [name] MRN: [id number] Date of Birth: 1945-11-30  [facility]) CM/SW Contact:    Chapman Fitch, RN Phone Number: 02/15/2019, 2:46 PM  Clinical Narrative:                 Patient admitted from home with Acute respiratory failure Assessment completed via phone with daughter who has guardian ship.  Daughter to e-mail RNCM guardianship form    Patient and her husband have lived with daughter, son in law, and 4 grandchildren Per daughter patient is total care.  Daughter states that at baseline they are able to stand the patient up from the bed, walk her "like a puppet".  They then put her in the wheelchair, wheel her to the couch and place her there.   Daughter states the only equipment they have is a wheelchair, however "it belonged to my dad, and its one with the small wheels"  Daughter would like PT eval to see if patient appropriate for SNF.  She is aware that is patient unable to participate or not SNF appropriate patient will have to return home.  She is not interested in a hospital bed of hoyer lift at this time.   Daughter states that she is working with an Radio producer to get the patient's house out of her name, in order to be able to apply for Medicaid.  Daughter is aware that patient does not have long term payor for SNF  MD notified request for PT eval       Patient Goals and CMS Choice        Expected Discharge Plan and Services         Living arrangements for the past 2 months: Single Family Home                                      Prior Living Arrangements/Services Living arrangements for the past 2 months: Single Family Home Lives with:: Adult Children, Relatives, Spouse                   Activities of Daily Living Home Assistive Devices/Equipment: Dentures (specify type), Nurse, adult, Wheelchair ADL Screening (condition at time of  admission) Patient's cognitive ability adequate to safely complete daily activities?: No Is the patient deaf or have difficulty hearing?: No Does the patient have difficulty seeing, even when wearing glasses/contacts?: No Does the patient have difficulty concentrating, remembering, or making decisions?: Yes Patient able to express need for assistance with ADLs?: No Does the patient have difficulty dressing or bathing?: Yes Independently performs ADLs?: No Communication: Dependent Is this a change from baseline?: Pre-admission baseline Dressing (OT): Dependent Is this a change from baseline?: Pre-admission baseline Grooming: Dependent Is this a change from baseline?: Pre-admission baseline Feeding: Dependent Is this a change from baseline?: Pre-admission baseline Bathing: Dependent Is this a change from baseline?: Pre-admission baseline Toileting: Dependent Is this a change from baseline?: Pre-admission baseline In/Out Bed: Dependent Is this a change from baseline?: Pre-admission baseline Walks in Home: Dependent Is this a change from baseline?: Pre-admission baseline Does the patient have difficulty walking or climbing stairs?: Yes Weakness of Legs: Both Weakness of Arms/Hands: Both  Permission Sought/Granted                  Emotional Assessment  Orientation: : Oriented to Self      Admission diagnosis:  choking ems Patient Active Problem List   Diagnosis Date Noted  . Acute respiratory failure ([place]) 02/13/2019  . Goals of care, counseling/discussion   . Palliative care by specialist   . Acute delirium 10/03/2018  . History of stroke 10/01/2018  . Protein-calorie malnutrition, mild ([place]) 02/17/2018  . Vitamin D deficiency 06/20/2017  . Arthritis 06/17/2017  . Hx of peptic ulcer 06/17/2017  . IBS (irritable bowel syndrome) 06/17/2017  . Unintended weight loss 06/17/2017  . CKD (chronic kidney disease) stage 3, GFR 30-59 ml/min 06/17/2017  . Balance  problem 11/09/2016  . Late onset Alzheimer's disease with behavioral disturbance ([place]) 11/09/2016  . Macrocytic anemia 08/03/2016  . Hx of breast cancer 07/28/2016  . Folate deficiency 10/02/2015  . Bipolar 1 disorder, depressed ([place]) 09/25/2015  . Smoker 09/25/2015  . B12 deficiency 09/11/2015  . Anxiety 07/23/2015  . Severe episode of recurrent major depressive disorder ([place]) 07/03/2015  . Dementia with behavioral disturbance ([place]) 05/29/2015  . History of migraine headaches 09/08/2014  . H/O Bell's palsy 09/08/2014  . H/O: HTN (hypertension) 09/08/2014  . Gastroesophageal reflux disease without esophagitis 09/08/2014  . COPD (chronic obstructive pulmonary disease) ([place]) 09/08/2014  . History of asthma 09/08/2014  . Breast neoplasm, Tis ([facility]) 01/23/2013   PCP:  Glean Hess, MD Pharmacy:   [id number] - [place], [place] [address] Phone: [phone] Fax: [phone]     Social Determinants of Health (SDOH) Interventions    Readmission Risk Interventions Readmission Risk Prevention Plan 02/15/2019  Transportation Screening Complete  Medication Review (RN Care Manager) Complete  Some recent data might be hidden

## 2019-02-15 NOTE — Care Management Important Message (Signed)
Important Message  Patient Details  Name: Deborah [name] MRN: [id number] Date of Birth: Aug 05, 1945   Medicare Important Message Given:  Yes     Dannette Barbara 02/15/2019, 1:44 PM

## 2019-02-15 NOTE — Progress Notes (Signed)
[facility] [place] [facility]   PATIENT NAME: Deborah [name]    MR#:  [id number]  DATE OF BIRTH:  [birth year]  SUBJECTIVE:  CHIEF COMPLAINT:   Chief Complaint  Patient presents with  . Respiratory Distress  . Aspiration   Brought after suspected aspiration episode and intubated.  Bronchoscopy done with removal of some chicken feces from lung.  Extubated 02/14/19. Patient is alert but has severe dementia so does not talk much just answering 1 or 2 words.  REVIEW OF SYSTEMS:   Patient has dementia, cannot give review of system ROS  DRUG ALLERGIES:   Allergies  Allergen Reactions  . Prednisone Nausea And Vomiting, Nausea Only and Other (See Comments)  . Chlorphen-Pe-Acetaminophen Other (See Comments)    Reaction: unknown    VITALS:  Blood pressure (!) 126/53, pulse 75, temperature 98.2 F (36.8 C), temperature source Oral, resp. rate 20, height 5' 2.99" (1.6 m), weight 52 kg, SpO2 98 %.  PHYSICAL EXAMINATION:   GENERAL:  73 y.o.-year-old patient lying in the bed with no acute distress.  EYES: Pupils equal, round, reactive to light and accommodation. No scleral icterus. Extraocular muscles intact.  HEENT: Head atraumatic, normocephalic.   NECK:  Supple, no jugular venous distention. No thyroid enlargement, no tenderness.  LUNGS: bilateral equal breath sounds ,no  wheezing, positive rales,rhonchi or crepitation. No use of accessory muscles of respiration.  CARDIOVASCULAR: S1, S2 normal. No murmurs, rubs, or gallops.  ABDOMEN: Soft, nontender, nondistended. Bowel sounds present.  EXTREMITIES: No pedal edema, cyanosis, or clubbing.  NEUROLOGIC:  Alert, due to terminal dementia does not follow commands.  She moves her upper extremities little bit spontaneously.  PSYCHIATRIC: The patient is  alert and oriented x0 SKIN: No obvious rash, lesion, or ulcer.  Physical Exam LABORATORY PANEL:   CBC Recent Labs  Lab 02/14/19 0526  WBC 14.6*  HGB 12.1  HCT  37.7  PLT 148*   ------------------------------------------------------------------------------------------------------------------  Chemistries  Recent Labs  Lab 02/14/19 0526  NA 144  K 3.4*  CL 108  CO2 20*  GLUCOSE 147*  BUN 19  CREATININE 1.26*  CALCIUM 8.4*  AST 27  ALT 15  ALKPHOS 46  BILITOT 0.4   ------------------------------------------------------------------------------------------------------------------  Cardiac Enzymes No results for input(s): TROPONINI in the last 168 hours. ------------------------------------------------------------------------------------------------------------------  RADIOLOGY:  Dg Chest Port 1 View  Result Date: 02/14/2019 CLINICAL DATA:  Acute respiratory failure EXAM: PORTABLE CHEST 1 VIEW COMPARISON:  02/13/2019 FINDINGS: Endotracheal tube is just above the carina. This is directed toward the right mainstem bronchus and could be retracted 1-2 cm for optimal positioning. Heart is normal size. No confluent airspace opacities or effusions. No acute bony abnormality. IMPRESSION: Endotracheal tube just above the level of the carina directed toward the right mainstem bronchus. This could be retracted 1-2 cm for optimal positioning. No acute cardiopulmonary disease. Electronically Signed   By: Charlett Nose M.D.   On: 02/14/2019 08:18    ASSESSMENT AND PLAN:   Active Problems:   Acute respiratory failure (HCC)   #Acute hypoxic respiratory failure from aspiration-intubated Monitoring intensive care unit. Vent management per ICU protocol Dr. Belia Heman had done bronchoscopy with removing some small chicken pieces. She is extubated and on room air now.  #Aspiration pneumonitis Initially intubated, extubated Patient is started on Unasyn Swallow evaluation is done suggested dysphagia diet.  #COPD exacerbation Patient recently quit smoking in May 2020 Bronchodilator nebulizer treatments as needed with albuterol every 4 hours  Steroids  per ICU  #  Dementia Terminal dementia as per daughter and patient does not walk anymore at home.  #Hypertension Blood pressure is stable.  #CKD stage III Appears stable, continue to monitor.  GI prophylaxis with Protonix and DVT prophylaxis with Lovenox acute   All the records are reviewed and case discussed with Care Management/Social Workerr. Management plans discussed with the patient, family and they are in agreement.  CODE STATUS: DNR.  TOTAL TIME TAKING CARE OF THIS PATIENT: 35 minutes.  Spoke to patient's daughter on phone. As patient is wheelchair-bound and total care at home for last 3 to 4 months, daughter is interested in getting her placed in nursing facility if approved.  Social worker is aware about making arrangements.   POSSIBLE D/C IN *1-2 DAYS, DEPENDING ON CLINICAL CONDITION.   Vaughan Basta M.D on 02/15/2019   Between 7am to 6pm - Pager - [phone]  After 6pm go to [email or web address] - password [email or web address] [place] [facility]  Office  [phone]  CC: Primary care physician; Glean Hess, MD  Note: This dictation was prepared with Dragon dictation along with smaller phrase technology. Any transcriptional errors that result from this process are unintentional.

## 2019-02-15 NOTE — Evaluation (Signed)
Clinical/Bedside Swallow Evaluation Patient Details  Name: Deborah [name] MRN: [id number] Date of Birth: April 10, 1946  Today's Date: 02/15/2019 Time: SLP Start Time (ACUTE ONLY): 1030 SLP Stop Time (ACUTE ONLY): 1130 SLP Time Calculation (min) (ACUTE ONLY): 60 min  Past Medical History:  Past Medical History:  Diagnosis Date  . Anxiety   . Arthritis   . Asthma   . Bacteriuria 07/31/2016  . Bipolar 1 disorder (HCC)   . Bipolar 1 disorder, mixed, severe (HCC) 07/26/2016  . Cystitis   . Dementia (HCC)   . Depression   . Heart murmur   . IBS (irritable bowel syndrome)   . Osteoporosis   . Spastic colon   . Swallowing problem 11/09/2016  . Thrombocytopenia (HCC) 08/03/2016  . Ulcer    Past Surgical History:  Past Surgical History:  Procedure Laterality Date  . arm surgery Left 2011   fracture repair  . BREAST BIOPSY     [facility]  . CHOLECYSTECTOMY    . COLONOSCOPY  2002  . ELBOW SURGERY    . SHOULDER SURGERY    . SPINE SURGERY    . throat biopsy   2012  . UPPER GI ENDOSCOPY  2011   HPI:  Pt is a 73 y.o. female with a known history of Dementia, COPD, asthma, anxiety, Bipolar Disorder, depression and aggressive behavior, "swallowing problem" in 2018 per H&P, and multiple other medical problems was eating her lunch today and choked on a piece of chicken (chicken tender per report) and patient became hypoxemic.  EMS put her on CPAP and brought her into the ED and at the time of arrival to ED her pulse ox was 90%.  ED physician assessed but still patient was lethargic and becoming more hypoxemic again so she was intubated.  Hospitalist team admited the patient.  Intensivist notified planned a bronchoscopy which was performed on 02/13/2019 removing few bits of chicken remaining.  Pt remained intubated and was extubated on 02/14/2019 and awaiting BSE today.  At baseline, pt is somewhat nonverbal suspect related to declined Cognitive status.  Per recent chart notes/admission in 09/2018, pt has  ongoing declining behavior for the last several months getting progressively worse.  Pt was last admitted w/ Delirium with acute psychosis, underlying Dementia; UTI treated.  Psychiatry followed then.     Assessment / Plan / Recommendation Clinical Impression  Pt appears to present w/ grossly adequate oropharyngeal phase swallow function w/ MODIFIED Food/Liquid consistencies at this evaluation today. Fair-Good oropharyngeal phase management of the po trials noted though pt's overall status is impacted by the degree of decline in Cognitive status -- Dementia and impulsive feeding behavior w/ reduced attention during po tasks that she has exhibited previously during an assessment (during as admission in 09/2018). This can increase risk for choking/aspiration during oral intake if not monitored and/or following general aspiration precautions. Pt was admitted after choking on chicken at home; orally intubated/extubated in ~2 days; bronchoscopy removing bits of chicken. She is currently awake, resting in bed; verbally responsive w/ few phrases/words but did not follow commands given mod+ cues. OME was not formally assessed d/t her lack of follow through but appeared grossly [facility] w/ no unilateral weakness noted in lingual/labial movements during po intake/clearing. Pt was positioned upright then consumed po trials of Single ice chips, Nectar liquids via cup/straw, and puree w/ no overt, clinical s/s of aspiration noted; clear vocal quality and no decline in respiratory status noted during/post trials. Oral phase was grossly [facility] for bolus management  and clearing w/ these consistencies in single boluses but she exhibited increased oral phase time to complete a swallow of multiple sips of Nectar liquids (x1 episode). She did not make any attempt to hold Cup for drinking despite cues and support by SLP. Suspect pt's baseline Cognitive and behavioral issues impact her self-feeding abilities and could increase risk for  choking/aspiration if not monitored; could also impact ability to meet nutritional needs fully.  Recommend a Dysphagia level 1 (puree) foods diet w/ Nectar consistency liquids. Recommend general aspiration precautions and Supervision at meals; Pills in puree (crushed) for safer swallowing; 100% Supervision and assistance at all meals by NSG staff d/t Cognitive status and behavioral issues as these can impact safety w/ oral intake. Recommend Dietician f/u -- drink supplements have been utilized in past. Recommend Palliative Care consult for GOC.  ST services will f/u w/ trials to upgrade diet consistency as appropriate during admission. This was discussed w/ NSG.  SLP Visit Diagnosis: Dysphagia, oral phase (R13.11);Dysphagia, oropharyngeal phase (R13.12)(Cognitive decline/Dementia )    Aspiration Risk  Mild aspiration risk;Risk for inadequate nutrition/hydration    Diet Recommendation  Dysphagia level 1 (puree) w/ NECTAR consistency liquids; general aspiration precautions; 100% Supervision during all oral intake and reduce Impulsive Eating/Drinking Behaviors  Medication Administration: Crushed with puree(for safer swallowing)    Other  Recommendations Recommended Consults: (Dietician f/u) Oral Care Recommendations: Oral care BID;Staff/trained caregiver to provide oral care Other Recommendations: Order thickener from pharmacy;Prohibited food (jello, ice cream, thin soups);Remove water pitcher;Have oral suction available   Follow up Recommendations (TBD)      Frequency and Duration min 3x week  2 weeks       Prognosis Prognosis for Safe Diet Advancement: Fair Barriers to Reach Goals: Cognitive deficits;Time post onset;Severity of deficits(Bipolar Dis.)      Swallow Study   General Date of Onset: 02/13/19 HPI: Pt is a 73 y.o. female with a known history of Dementia, COPD, asthma, anxiety, Bipolar Disorder, depression and aggressive behavior, "swallowing problem" in 2018 per H&P, and multiple  other medical problems was eating her lunch today and choked on a piece of chicken (chicken tender per report) and patient became hypoxemic.  EMS put her on CPAP and brought her into the ED and at the time of arrival to ED her pulse ox was 90%.  ED physician assessed but still patient was lethargic and becoming more hypoxemic again so she was intubated.  Hospitalist team admited the patient.  Intensivist notified planned a bronchoscopy which was performed on 02/13/2019 removing few bits of chicken remaining.  Pt remained intubated and was extubated on 02/14/2019 and awaiting BSE today.  At baseline, pt is somewhat nonverbal suspect related to declined Cognitive status.  Per recent chart notes/admission in 09/2018, pt has ongoing declining behavior for the last several months getting progressively worse.  Pt was last admitted w/ Delirium with acute psychosis, underlying Dementia; UTI treated.  Psychiatry followed then.   Type of Study: Bedside Swallow Evaluation Previous Swallow Assessment: 09/2018 Diet Prior to this Study: NPO Temperature Spikes Noted: No(wbc 14.6 declining) Respiratory Status: Room air History of Recent Intubation: Yes Length of Intubations (days): 2 days Date extubated: 02/14/19 Behavior/Cognition: Alert;Cooperative;Pleasant mood;Confused;Distractible;Requires cueing(minimally verbal) Oral Cavity Assessment: Within Functional Limits Oral Care Completed by SLP: Recent completion by staff Oral Cavity - Dentition: Edentulous(Dentures NOT placed) Vision: (unknown) Self-Feeding Abilities: Total assist(did not attempt to hold cup as during last admit) Patient Positioning: Upright in bed(needed positioning) Baseline Vocal Quality: Normal(during 2-3 sentences  spoken) Volitional Cough: Cognitively unable to elicit Volitional Swallow: Unable to elicit    Oral/Motor/Sensory Function Overall Oral Motor/Sensory Function: Within functional limits(during bolus management and clearing)   Ice  Chips Ice chips: Within functional limits Presentation: Spoon(fed; 5 trials) Other Comments: fairly good oral attention w/ intermitent holding/distracted behavior   Thin Liquid Thin Liquid: Not tested Other Comments: d/t Cognitive decline and risk for aspiration at this time    Nectar Thick Nectar Thick Liquid: Within functional limits Presentation: Cup;Straw;Spoon(~3-4 ozs total fed by SLP)   Honey Thick Honey Thick Liquid: Not tested   Puree Puree: Within functional limits Presentation: Spoon(fed; ~3+ ozs)   Solid     Solid: Not tested Other Comments: Dentures Not placed         Orinda Kenner, MS, CCC-SLP , 02/15/2019,2:24 PM

## 2019-02-16 DIAGNOSIS — Z23 Encounter for immunization: Secondary | ICD-10-CM | POA: Diagnosis not present

## 2019-02-16 MED ORDER — CHLORHEXIDINE GLUCONATE CLOTH 2 % EX PADS
6.0000 | MEDICATED_PAD | Freq: Every day | CUTANEOUS | Status: DC
  Administered 2019-02-16: 6 via TOPICAL

## 2019-02-16 MED ORDER — AMOXICILLIN-POT CLAVULANATE 600-42.9 MG/5ML PO SUSR: 600.0000 mg | Freq: Two times a day (BID) | ORAL | 0 refills | Status: AC

## 2019-02-16 MED ORDER — DIVALPROEX SODIUM 125 MG PO CSDR
625.0000 mg | DELAYED_RELEASE_CAPSULE | Freq: Two times a day (BID) | ORAL | Status: DC
  Administered 2019-02-16: 625 mg via ORAL
  Filled 2019-02-16 (×2): qty 5

## 2019-02-16 MED ORDER — IPRATROPIUM-ALBUTEROL 0.5-2.5 (3) MG/3ML IN SOLN: 3.0000 mL | Freq: Four times a day (QID) | RESPIRATORY_TRACT | 0 refills | Status: DC

## 2019-02-16 MED ORDER — DIVALPROEX SODIUM 125 MG PO CSDR: 625.0000 mg | DELAYED_RELEASE_CAPSULE | Freq: Two times a day (BID) | ORAL | 0 refills | Status: DC

## 2019-02-16 NOTE — TOC Progression Note (Signed)
Transition of Care [facility]) - Progression Note    Patient Details  Name: Deborah [name] MRN: [id number] Date of Birth: 1945/11/17  Transition of Care [facility]) CM/SW [place], [place] Phone Number: 02/16/2019, 1:39 PM  Clinical Narrative:   CSW notified by PT that patient is unable to participate in therapy session. CSW notified patient's daughter/guardian of this information and that her insurance would not cover SNF. Daughter states that she understands and that she can take patient home with her whenever she is medically ready. Daughter states she does not need any equipment at this time.          Expected Discharge Plan and Services         Living arrangements for the past 2 months: Single Family Home                                       Social Determinants of Health (SDOH) Interventions    Readmission Risk Interventions Readmission Risk Prevention Plan 02/15/2019  Transportation Screening Complete  Medication Review [employer] photographer) Complete  Some recent data might be hidden

## 2019-02-16 NOTE — TOC Progression Note (Cosign Needed)
Transition of Care [facility]) - Progression Note    Patient Details  Name: Deborah [name] MRN: [id number] Date of Birth: Jun 11, 1945  Transition of Care [facility]) CM/SW Contact  Elza Rafter, RN Phone Number: 02/16/2019, 3:23 PM  Clinical Narrative:      Patient suffers from weakness which impairs their ability to perform daily activities like ADLs in the home.  A walking aid will not resolve  issue with performing activities of daily living. A wheelchair will allow patient to safely perform daily activities. Patient is not able to propel themselves in the home using a standard weight wheelchair due to weakness. Patient can self propel in the lightweight wheelchair. Length of need 99years Accessories: elevating leg rests (ELRs), wheel locks, extensions and anti-tippers.        Expected Discharge Plan and Services         Living arrangements for the past 2 months: Single Family Home                                       Social Determinants of Health (SDOH) Interventions    Readmission Risk Interventions Readmission Risk Prevention Plan 02/15/2019  Transportation Screening Complete  Medication Review Press photographer) Complete  Some recent data might be hidden

## 2019-02-16 NOTE — Discharge Summary (Signed)
[facility] [place] [facility]   PATIENT NAME: Deborah [name]    MR#:  [id number]  DATE OF BIRTH:  1946-04-27  DATE OF ADMISSION:  02/13/2019 ADMITTING PHYSICIAN: Ramonita LabAruna Gouru, MD  DATE OF DISCHARGE: 02/16/2019   PRIMARY CARE PHYSICIAN: Reubin MilanBerglund, Laura H, MD    ADMISSION DIAGNOSIS:  choking ems  DISCHARGE DIAGNOSIS:  Active Problems:   Acute respiratory failure (HCC)   SECONDARY DIAGNOSIS:   Past Medical History:  Diagnosis Date  . Anxiety   . Arthritis   . Asthma   . Bacteriuria 07/31/2016  . Bipolar 1 disorder (HCC)   . Bipolar 1 disorder, mixed, severe (HCC) 07/26/2016  . Cystitis   . Dementia (HCC)   . Depression   . Heart murmur   . IBS (irritable bowel syndrome)   . Osteoporosis   . Spastic colon   . Swallowing problem 11/09/2016  . Thrombocytopenia (HCC) 08/03/2016  . Ulcer     HOSPITAL COURSE:   #Acute hypoxic respiratory failure from aspiration-intubated Monitoring intensive care unit. Vent management per ICU protocol Dr. Belia HemanKasa had done bronchoscopy with removing some small chicken pieces. She is extubated and on room air now. will give oral augmentin for 4 days.  #Aspiration pneumonitis Initially intubated, extubated Patient is started on Unasyn Swallow evaluation is done suggested dysphagia diet.  #COPD exacerbation Patient recently quit smoking in May 2020 Bronchodilator nebulizer treatments as needed with albuterol every 4 hours  Steroids per ICU  #Dementia Terminal dementia as per daughter and patient does not walk anymore at home.  #Hypertension Blood pressure is stable.  #CKD stage III Appears stable, continue to monitor.  GI prophylaxis with Protonix and DVT prophylaxis with Lovenox acute  Pt is not rehabable, as she does not walk for long time. Daughter agreed to take her to home.  DISCHARGE CONDITIONS:   Stable.  CONSULTS OBTAINED:    DRUG ALLERGIES:   Allergies  Allergen Reactions   . Prednisone Nausea And Vomiting, Nausea Only and Other (See Comments)  . Chlorphen-Pe-Acetaminophen Other (See Comments)    Reaction: unknown    DISCHARGE MEDICATIONS:   Allergies as of 02/16/2019      Reactions   Prednisone Nausea And Vomiting, Nausea Only, Other (See Comments)   Chlorphen-pe-acetaminophen Other (See Comments)   Reaction: unknown      Medication List    STOP taking these medications   escitalopram 5 MG tablet Commonly known as: LEXAPRO   mirtazapine 15 MG tablet Commonly known as: REMERON   traZODone 50 MG tablet Commonly known as: DESYREL     TAKE these medications   albuterol 108 (90 Base) MCG/ACT inhaler Commonly known as: VENTOLIN HFA Inhale 2 puffs into the lungs every 4 (four) hours as needed for wheezing or shortness of breath.   amoxicillin-clavulanate 600-42.9 MG/5ML suspension Commonly known as: Augmentin ES-600 Take 5 mLs (600 mg total) by mouth 2 (two) times daily for 4 days.   aspirin 81 MG EC tablet Take 1 tablet (81 mg total) by mouth daily.   atorvastatin 20 MG tablet Commonly known as: LIPITOR TAKE 1 TABLET (20 MG TOTAL) BY MOUTH DAILY AT 6 PM.   cholecalciferol 25 MCG (1000 UT) tablet Commonly known as: VITAMIN D3 Take 1,000 Units by mouth daily.   divalproex 125 MG capsule Commonly known as: DEPAKOTE SPRINKLE Take 5 capsules (625 mg total) by mouth every 12 (twelve) hours. What changed: additional instructions   donepezil 5 MG tablet Commonly known as: ARICEPT Take  1 tablet (5 mg total) by mouth at bedtime.   folic acid 1 MG tablet Commonly known as: FOLVITE Take 1 tablet (1 mg total) by mouth daily.   ipratropium-albuterol 0.5-2.5 (3) MG/3ML Soln Commonly known as: DUONEB Take 3 mLs by nebulization every 6 (six) hours.   LORazepam 1 MG tablet Commonly known as: ATIVAN Take 1 tablet (1 mg total) by mouth daily as needed for anxiety.   OLANZapine 10 MG tablet Commonly known as: ZYPREXA Take 0.5-1 tablets (5-10  mg total) by mouth at bedtime. Takes 5 mg in the morning and 10 mg at bedtime What changed: when to take this   vitamin B-12 1000 MCG tablet Commonly known as: CYANOCOBALAMIN Take 1,000 mcg by mouth daily.            Durable Medical Equipment  (From admission, onward)         Start     Ordered   02/16/19 1522  For home use only DME lightweight manual wheelchair with seat cushion  Once    Comments: Patient suffers from weakness which impairs their ability to perform daily activities like ADLs in the home.  A walking aid will not resolve  issue with performing activities of daily living. A wheelchair will allow patient to safely perform daily activities. Patient is not able to propel themselves in the home using a standard weight wheelchair due to weakness. Patient can self propel in the lightweight wheelchair. Length of need 99years Accessories: elevating leg rests (ELRs), wheel locks, extensions and anti-tippers.  Add back cushion   02/16/19 1523           DISCHARGE INSTRUCTIONS:    Follow with PMD in 1-02 weeks.  If you experience worsening of your admission symptoms, develop shortness of breath, life threatening emergency, suicidal or homicidal thoughts you must seek medical attention immediately by calling 911 or calling your MD immediately  if symptoms less severe.  You Must read complete instructions/literature along with all the possible adverse reactions/side effects for all the Medicines you take and that have been prescribed to you. Take any new Medicines after you have completely understood and accept all the possible adverse reactions/side effects.   Please note  You were cared for by a hospitalist during your hospital stay. If you have any questions about your discharge medications or the care you received while you were in the hospital after you are discharged, you can call the unit and asked to speak with the hospitalist on call if the hospitalist that took  care of you is not available. Once you are discharged, your primary care physician will handle any further medical issues. Please note that NO REFILLS for any discharge medications will be authorized once you are discharged, as it is imperative that you return to your primary care physician (or establish a relationship with a primary care physician if you do not have one) for your aftercare needs so that they can reassess your need for medications and monitor your lab values.    Today   CHIEF COMPLAINT:   Chief Complaint  Patient presents with  . Respiratory Distress  . Aspiration    HISTORY OF PRESENT ILLNESS:  Deborah [name]  is a 73 y.o. female with a known history of dementia, COPD, asthma, anxiety, bipolar disorder, depression and dementia and multiple other medical problems is eating her lunch today afternoon and choked on a piece of chicken and patient became hypoxemic.  EMS put her on CPAP and brought her  into the ED and at the time of arrival to ED her pulse ox was 90%.  ED physician was able to remove you on second basis but still patient was lethargic and being hypoxemic she was intubated.  Hospitalist team is called admit the patient.  Intensivist notified who is planning to do bronchoscopy   VITAL SIGNS:  Blood pressure (!) 141/66, pulse 75, temperature 98.5 F (36.9 C), temperature source Oral, resp. rate 18, height 5' 2.99" (1.6 m), weight 52 kg, SpO2 96 %.  I/O:    Intake/Output Summary (Last 24 hours) at 02/16/2019 1534 Last data filed at 02/16/2019 1406 Gross per 24 hour  Intake 1885.64 ml  Output 950 ml  Net 935.64 ml    PHYSICAL EXAMINATION:   GENERAL:73 y.o.-year-old patient lying in the bed with no acute distress.  EYES: Pupils equal, round, reactive to light and accommodation. No scleral icterus. Extraocular muscles intact.  HEENT: Head atraumatic, normocephalic.  NECK: Supple, no jugular venous distention. No thyroid enlargement, no tenderness.   LUNGS:bilateral equalbreath sounds ,nowheezing,positiverales,rhonchi or crepitation. No use of accessory muscles of respiration.  CARDIOVASCULAR: S1, S2 normal. No murmurs, rubs, or gallops.  ABDOMEN: Soft, nontender, nondistended. Bowel sounds present.  EXTREMITIES: No pedal edema, cyanosis, or clubbing.  NEUROLOGIC: Alert, due to terminal dementia does not follow commands.  She moves her upper extremities little bit spontaneously.  PSYCHIATRIC: The patient is alert and oriented x0 SKIN: No obvious rash, lesion, or ulcer.   DATA REVIEW:   CBC Recent Labs  Lab 02/14/19 0526  WBC 14.6*  HGB 12.1  HCT 37.7  PLT 148*    Chemistries  Recent Labs  Lab 02/14/19 0526  NA 144  K 3.4*  CL 108  CO2 20*  GLUCOSE 147*  BUN 19  CREATININE 1.26*  CALCIUM 8.4*  AST 27  ALT 15  ALKPHOS 46  BILITOT 0.4    Cardiac Enzymes No results for input(s): TROPONINI in the last 168 hours.  Microbiology Results  Results for orders placed or performed during the hospital encounter of 02/13/19  SARS Coronavirus 2 [facility] order, Performed in [facility] hospital lab) Nasopharyngeal Nasopharyngeal Swab     Status: None   Collection Time: 02/13/19  2:39 PM   Specimen: Nasopharyngeal Swab  Result Value Ref Range Status   SARS Coronavirus 2 NEGATIVE NEGATIVE Final    Comment: (NOTE) If result is NEGATIVE SARS-CoV-2 target nucleic acids are NOT DETECTED. The SARS-CoV-2 RNA is generally detectable in upper and lower  respiratory specimens during the acute phase of infection. The lowest  concentration of SARS-CoV-2 viral copies this assay can detect is 250  copies / mL. A negative result does not preclude SARS-CoV-2 infection  and should not be used as the sole basis for treatment or other  patient management decisions.  A negative result may occur with  improper specimen collection / handling, submission of specimen other  than nasopharyngeal swab, presence of viral mutation(s) within  the  areas targeted by this assay, and inadequate number of viral copies  (<250 copies / mL). A negative result must be combined with clinical  observations, patient history, and epidemiological information. If result is POSITIVE SARS-CoV-2 target nucleic acids are DETECTED. The SARS-CoV-2 RNA is generally detectable in upper and lower  respiratory specimens dur ing the acute phase of infection.  Positive  results are indicative of active infection with SARS-CoV-2.  Clinical  correlation with patient history and other diagnostic information is  necessary to determine patient infection  status.  Positive results do  not rule out bacterial infection or co-infection with other viruses. If result is PRESUMPTIVE POSTIVE SARS-CoV-2 nucleic acids MAY BE PRESENT.   A presumptive positive result was obtained on the submitted specimen  and confirmed on repeat testing.  While 2019 novel coronavirus  (SARS-CoV-2) nucleic acids may be present in the submitted sample  additional confirmatory testing may be necessary for epidemiological  and / or clinical management purposes  to differentiate between  SARS-CoV-2 and other Sarbecovirus currently known to infect humans.  If clinically indicated additional testing with an alternate test  methodology [phone]) is advised. The SARS-CoV-2 RNA is generally  detectable in upper and lower respiratory sp ecimens during the acute  phase of infection. The expected result is Negative. Fact Sheet for Patients:  BoilerBrush.com.cy Fact Sheet for Healthcare Providers: https://pope.com/ This test is not yet approved or cleared by the [place] FDA and has been authorized for detection and/or diagnosis of SARS-CoV-2 by FDA under an Emergency Use Authorization (EUA).  This EUA will remain in effect (meaning this test can be used) for the duration of the COVID-19 declaration under Section 564(b)(1) of the Act, 21  U.S.C. section 360bbb-3(b)(1), unless the authorization is terminated or revoked sooner. Performed at [facility], [address] [place], [place] [address]   MRSA PCR Screening     Status: None   Collection Time: 02/13/19  6:04 PM   Specimen: Nasal Mucosa; Nasopharyngeal  Result Value Ref Range Status   MRSA by PCR NEGATIVE NEGATIVE Final    Comment:        The GeneXpert MRSA Assay (FDA approved for NASAL specimens only), is one component of a comprehensive MRSA colonization surveillance program. It is not intended to diagnose MRSA infection nor to guide or monitor treatment for MRSA infections. Performed at [facility], [address], [place], [place] [address]   Urine Culture     Status: None   Collection Time: 02/14/19  6:00 AM   Specimen: Urine, Random  Result Value Ref Range Status   Specimen Description   Final    URINE, RANDOM Performed at [facility], [address], [place], [place] [address]    Special Requests   Final    NONE Performed at [facility], [address], [place], [place] [address]    Culture   Final    NO GROWTH Performed at [facility], [address] [place], [place] [address]    Report Status 02/15/2019 FINAL  Final    RADIOLOGY:  No results found.  EKG:   Orders placed or performed during the hospital encounter of 02/13/19  . EKG 12-Lead  . EKG 12-Lead      Management plans discussed with the patient, family and they are in agreement.  CODE STATUS: DNR    Code Status Orders  (From admission, onward)         Start     Ordered   02/14/19 1228  Do not attempt resuscitation (DNR)  Continuous    Question Answer Comment  In the event of cardiac or respiratory ARREST Do not call a "code blue"   In the event of cardiac or respiratory ARREST Do not perform Intubation, CPR, defibrillation or ACLS   In the event of cardiac or respiratory ARREST Use medication by any route,  position, wound care, and other measures to relive pain and suffering. May use oxygen, suction and manual treatment of airway obstruction as  needed for comfort.      02/14/19 1227        Code Status History    Date Active Date Inactive Code Status Order ID Comments User Context   02/13/2019 1617 02/14/2019 1227 Full Code [id number]  Ramonita Lab, MD ED   10/06/2018 1237 10/10/2018 2102 DNR [id number]  Joylene Draft, NP Inpatient   10/01/2018 1702 10/06/2018 1237 Partial Code [id number]  Mayo, Allyn Kenner, MD ED   09/25/2015 2034 09/26/2015 1728 Full Code [id number]  Clapacs, Jackquline Denmark, MD Inpatient   Advance Care Planning Activity      TOTAL TIME TAKING CARE OF THIS PATIENT:35 minutes.    Altamese Dilling M.D on 02/16/2019 at 3:34 PM  Between 7am to 6pm - Pager - [phone]  After 6pm go to [email or web address] - password [employer]  [facility]  Office  [phone]  CC: Primary care physician; Reubin Milan, MD   Note: This dictation was prepared with Dragon dictation along with smaller phrase technology. Any transcriptional errors that result from this process are unintentional.

## 2019-02-16 NOTE — Progress Notes (Signed)
PT Cancellation Note  Patient Details Name: Deborah [name] MRN: [id number] DOB: 12-26-1945   Cancelled Treatment:    Reason Eval/Treat Not Completed: PT screened, no needs identified, will sign off.  Spoke to patient's daughter who stated pt is total care at baseline including with all functional tasks and with all ADLs.  Pt's daughter stated family is working towards trying to get patient into a long term care facility and has spoken to SW for guidance.  Pt is not appropriate for skilled PT services and will complete PT orders at this time.     Linus Salmons PT, DPT 02/16/19, 10:03 AM

## 2019-02-16 NOTE — Discharge Instructions (Signed)
Recommended Diet at Discharge:  Dysphagia level 1 (PUREE) w/ NECTAR liquids. General aspiration and Reflux precautions; Feeding Support at meals - reduce distractions at meals; Pills CRUSHED in Puree for safer swallowing.

## 2019-02-16 NOTE — Progress Notes (Signed)
Speech Language Pathology Treatment: Dysphagia  Patient Details Name: Deborah [name] MRN: [id number] DOB: 1945-08-19 Today's Date: 02/16/2019 Time: 1540-0867 SLP Time Calculation (min) (ACUTE ONLY): 50 min  Assessment / Plan / Recommendation Clinical Impression  Pt seen today for ongoing assessment of swallowing; toleration of dysphagia diet and for trials to attempt diet upgrade (of liquid consistency). Pt's Daughter was in room w/ pt; education given on aspiration precautions; dysphagia in light of Cognitive decline/Dementia -- especially attempting to eat foods requiring increased mastication/attention; supportive strategies and food/liquid preparation as pt is on Nectar consistency liquids currently. Pt appeared less engaged even w/ Daughter than at evaluation yesterday.  Pt positioned upright and presented w/ trials of thin liquids via cup, then straw(she uses only a straw at home per Dtr). No immediate clinical s/s of aspiration were noted w/ the 3 small, sip trials, HOWEVER, pt exhibited such decline in attention to po task and delayed oral phase (A-P transfer) that delay in pharyngeal swallowing is suspect which significantly increases pt's risk for aspiration thus Pulmonary decline. Pt was then given trials of Nectar liquids and purees w/ a more timely increased oropharyngeal phase response and awareness to the po trials; no overt s/s of aspiration noted, and oral clearing achieved w/ each trial. Pt required full feeding assistance w/ strategies of alternating foods/liquids; time b/t trials for oral clearing.  Recommend remaining on a dysphagia diet w/ thickened liquids at this time d/t pt's current presentation and degree of impact from Cognitive decline in order to reduce risk for aspiration and Pulmonary decline. Recommend a Dysphagia level 1 (PUREE) w/ NECTAR liquids. General aspiration and Reflux precautions; Feeding Support at meals - reduce distractions at meals; Pills CRUSHED in Puree  for safer swallowing. ST services can be available for further education on Dementia and swallowing, Dysphagia for family as needed while admitted. Recommend such f/u at discharge if needed for family support.     HPI HPI: Pt is a 73 y.o. female with a known history of Dementia, COPD, asthma, anxiety, Bipolar Disorder, depression and aggressive behavior, "swallowing problem" in 2018 per H&P, and multiple other medical problems was eating her lunch today and choked on a piece of chicken (chicken tender per report) and patient became hypoxemic.  EMS put her on CPAP and brought her into the ED and at the time of arrival to ED her pulse ox was 90%.  ED physician assessed but still patient was lethargic and becoming more hypoxemic again so she was intubated.  Hospitalist team admited the patient.  Intensivist notified planned a bronchoscopy which was performed on 02/13/2019 removing few bits of chicken remaining.  Pt remained intubated and was extubated on 02/14/2019 and awaiting BSE today.  At baseline, pt is somewhat nonverbal suspect related to declined Cognitive status.  Per recent chart notes/admission in 09/2018, pt has ongoing declining behavior for the last several months getting progressively worse.  Pt was last admitted w/ Delirium with acute psychosis, underlying Dementia; UTI treated.  Psychiatry followed then.        SLP Plan  Continue with current plan of care       Recommendations  Diet recommendations: Dysphagia 1 (puree);Nectar-thick liquid Liquids provided via: Cup;Straw(monitor) Medication Administration: Crushed with puree(for safer swallowing) Supervision: Staff to assist with self feeding;Full supervision/cueing for compensatory strategies Compensations: Minimize environmental distractions;Slow rate;Small sips/bites;Lingual sweep for clearance of pocketing;Multiple dry swallows after each bite/sip;Follow solids with liquid Postural Changes and/or Swallow Maneuvers: Seated upright 90  degrees;Upright 30-60 min after  meal                General recommendations: (Dietician f/u) Oral Care Recommendations: Oral care BID;Staff/trained caregiver to provide oral care Follow up Recommendations: (TBD) SLP Visit Diagnosis: Dysphagia, oral phase (R13.11);Dysphagia, oropharyngeal phase (R13.12)(impact from Cognitive decline; Dementia) Plan: Continue with current plan of care       [place], [place], CCC-SLP , 02/16/2019, 2:56 PM

## 2019-02-17 ENCOUNTER — Other Ambulatory Visit: Payer: Self-pay | Admitting: Internal Medicine

## 2019-02-17 DIAGNOSIS — F319 Bipolar disorder, unspecified: Secondary | ICD-10-CM

## 2019-02-19 ENCOUNTER — Telehealth: Payer: Self-pay

## 2019-02-19 NOTE — Telephone Encounter (Signed)
Thank you! I will contact the daughter and let her know.

## 2019-02-19 NOTE — Telephone Encounter (Signed)
I spoke with Dr. Army Melia and she said there is no need to see the patient for either at this time. The patient will not answer questions. She said if her daughter has any concerns, then we can see her in the future. Thank you.

## 2019-02-19 NOTE — Telephone Encounter (Signed)
Transition Care Management Follow-up Telephone Call  Date of discharge and from where: 02/16/19 [facility]  How have you been since you were released from the hospital? Spoke to patients daughter Velta Addison who states patient is not doing anything for herself or talking. Maryann requested assistance from social work for long term care placement.   Any questions or concerns? Yes  - requires full time care  Items Reviewed:  Did the pt receive and understand the discharge instructions provided? Yes  - from patient's daughter  Medications obtained and verified? Yes   Any new allergies since your discharge? No   Dietary orders reviewed? Yes  Do you have support at home? Yes   Functional Questionnaire: (I = Independent and D = Dependent) ADLs: D  Bathing/Dressing- D  Meal Prep- D  Eating- D  Maintaining continence- D  Transferring/Ambulation- D  Managing Meds- D  Follow up appointments reviewed:   PCP Hospital f/u appt confirmed? No  Per PCP no needed at this time.   Are transportation arrangements needed? No   If their condition worsens, is the pt aware to call PCP or go to the Emergency Dept.? Yes  Was the patient provided with contact information for the PCP's office or ED? Yes  Was to pt encouraged to call back with questions or concerns? Yes

## 2019-02-19 NOTE — Telephone Encounter (Signed)
Patient to be contact today for TCM call; she needs to schedule hospital follow up appt for 2 weeks (03/02/19) but she is also schedule for 6 mo f/u for depression on 02/21/19. Do you want to change upcoming appt for hospital follow up or schedule separately? Pt or caregiver will likely ask during call if she is able to be reached. Thank you.

## 2019-02-19 NOTE — Telephone Encounter (Signed)
Pt is already set up with CCM.  They should be in touch with her daughter to provide assistance.

## 2019-02-21 ENCOUNTER — Ambulatory Visit: Payer: Self-pay | Admitting: Internal Medicine

## 2019-02-22 ENCOUNTER — Emergency Department: Payer: Medicare HMO

## 2019-02-22 ENCOUNTER — Other Ambulatory Visit: Payer: Self-pay

## 2019-02-22 ENCOUNTER — Inpatient Hospital Stay
Admission: EM | Admit: 2019-02-22 | Discharge: 2019-02-24 | DRG: 057 | Disposition: A | Discharge status: Hospice | Payer: Medicare HMO | Attending: Internal Medicine | Admitting: Internal Medicine

## 2019-02-22 ENCOUNTER — Ambulatory Visit: Payer: Self-pay | Admitting: *Deleted

## 2019-02-22 ENCOUNTER — Encounter: Payer: Self-pay | Admitting: Emergency Medicine

## 2019-02-22 ENCOUNTER — Telehealth: Payer: Self-pay

## 2019-02-22 DIAGNOSIS — E785 Hyperlipidemia, unspecified: Secondary | ICD-10-CM | POA: Diagnosis present

## 2019-02-22 DIAGNOSIS — Z7982 Long term (current) use of aspirin: Secondary | ICD-10-CM | POA: Diagnosis not present

## 2019-02-22 DIAGNOSIS — F05 Delirium due to known physiological condition: Secondary | ICD-10-CM | POA: Diagnosis present

## 2019-02-22 DIAGNOSIS — F0280 Dementia in other diseases classified elsewhere without behavioral disturbance: Secondary | ICD-10-CM | POA: Diagnosis not present

## 2019-02-22 DIAGNOSIS — Z79899 Other long term (current) drug therapy: Secondary | ICD-10-CM | POA: Diagnosis not present

## 2019-02-22 DIAGNOSIS — Z20828 Contact with and (suspected) exposure to other viral communicable diseases: Secondary | ICD-10-CM | POA: Diagnosis not present

## 2019-02-22 DIAGNOSIS — F0391 Unspecified dementia with behavioral disturbance: Secondary | ICD-10-CM

## 2019-02-22 DIAGNOSIS — F0390 Unspecified dementia without behavioral disturbance: Secondary | ICD-10-CM | POA: Diagnosis not present

## 2019-02-22 DIAGNOSIS — R627 Adult failure to thrive: Secondary | ICD-10-CM

## 2019-02-22 DIAGNOSIS — Z03818 Encounter for observation for suspected exposure to other biological agents ruled out: Secondary | ICD-10-CM | POA: Diagnosis not present

## 2019-02-22 DIAGNOSIS — F3163 Bipolar disorder, current episode mixed, severe, without psychotic features: Secondary | ICD-10-CM | POA: Diagnosis present

## 2019-02-22 DIAGNOSIS — G9340 Encephalopathy, unspecified: Secondary | ICD-10-CM | POA: Diagnosis not present

## 2019-02-22 DIAGNOSIS — M810 Age-related osteoporosis without current pathological fracture: Secondary | ICD-10-CM | POA: Diagnosis present

## 2019-02-22 DIAGNOSIS — Z8673 Personal history of transient ischemic attack (TIA), and cerebral infarction without residual deficits: Secondary | ICD-10-CM | POA: Diagnosis not present

## 2019-02-22 DIAGNOSIS — M7989 Other specified soft tissue disorders: Secondary | ICD-10-CM | POA: Diagnosis not present

## 2019-02-22 DIAGNOSIS — Z888 Allergy status to other drugs, medicaments and biological substances status: Secondary | ICD-10-CM | POA: Diagnosis not present

## 2019-02-22 DIAGNOSIS — N390 Urinary tract infection, site not specified: Secondary | ICD-10-CM | POA: Diagnosis not present

## 2019-02-22 DIAGNOSIS — F0281 Dementia in other diseases classified elsewhere with behavioral disturbance: Secondary | ICD-10-CM

## 2019-02-22 DIAGNOSIS — S79911A Unspecified injury of right hip, initial encounter: Secondary | ICD-10-CM | POA: Diagnosis not present

## 2019-02-22 DIAGNOSIS — R54 Age-related physical debility: Secondary | ICD-10-CM | POA: Diagnosis present

## 2019-02-22 DIAGNOSIS — F17210 Nicotine dependence, cigarettes, uncomplicated: Secondary | ICD-10-CM | POA: Diagnosis present

## 2019-02-22 DIAGNOSIS — Z8249 Family history of ischemic heart disease and other diseases of the circulatory system: Secondary | ICD-10-CM

## 2019-02-22 DIAGNOSIS — Z66 Do not resuscitate: Secondary | ICD-10-CM | POA: Diagnosis not present

## 2019-02-22 DIAGNOSIS — F419 Anxiety disorder, unspecified: Secondary | ICD-10-CM | POA: Diagnosis present

## 2019-02-22 DIAGNOSIS — E559 Vitamin D deficiency, unspecified: Secondary | ICD-10-CM | POA: Diagnosis present

## 2019-02-22 DIAGNOSIS — Z515 Encounter for palliative care: Secondary | ICD-10-CM | POA: Diagnosis present

## 2019-02-22 DIAGNOSIS — J449 Chronic obstructive pulmonary disease, unspecified: Secondary | ICD-10-CM | POA: Diagnosis not present

## 2019-02-22 DIAGNOSIS — G301 Alzheimer's disease with late onset: Secondary | ICD-10-CM | POA: Diagnosis not present

## 2019-02-22 DIAGNOSIS — R600 Localized edema: Secondary | ICD-10-CM | POA: Diagnosis not present

## 2019-02-22 DIAGNOSIS — R531 Weakness: Secondary | ICD-10-CM | POA: Diagnosis not present

## 2019-02-22 DIAGNOSIS — Z7189 Other specified counseling: Secondary | ICD-10-CM | POA: Diagnosis not present

## 2019-02-22 DIAGNOSIS — R4182 Altered mental status, unspecified: Secondary | ICD-10-CM | POA: Diagnosis not present

## 2019-02-22 LAB — COMPREHENSIVE METABOLIC PANEL
ALT: 15 U/L (ref 0–44)
AST: 20 U/L (ref 15–41)
Albumin: 3.5 g/dL (ref 3.5–5.0)
Alkaline Phosphatase: 55 U/L (ref 38–126)
Anion gap: 10 (ref 5–15)
BUN: 23 mg/dL (ref 8–23)
CO2: 24 mmol/L (ref 22–32)
Calcium: 9.1 mg/dL (ref 8.9–10.3)
Chloride: 110 mmol/L (ref 98–111)
Creatinine, Ser: 1.12 mg/dL — ABNORMAL HIGH (ref 0.44–1.00)
GFR calc Af Amer: 57 mL/min — ABNORMAL LOW (ref >60)
GFR calc non Af Amer: 49 mL/min — ABNORMAL LOW (ref >60)
Glucose, Bld: 127 mg/dL — ABNORMAL HIGH (ref 70–99)
Potassium: 4 mmol/L (ref 3.5–5.1)
Sodium: 144 mmol/L (ref 135–145)
Total Bilirubin: 0.5 mg/dL (ref 0.3–1.2)
Total Protein: 7.6 g/dL (ref 6.5–8.1)

## 2019-02-22 LAB — SARS CORONAVIRUS 2 (TAT 6-24 HRS): SARS Coronavirus 2: NEGATIVE

## 2019-02-22 LAB — CBC WITH DIFFERENTIAL/PLATELET
Abs Immature Granulocytes: 0.12 10*3/uL — ABNORMAL HIGH (ref 0.00–0.07)
Basophils Absolute: 0.1 10*3/uL (ref 0.0–0.1)
Basophils Relative: 1 %
Eosinophils Absolute: 0 10*3/uL (ref 0.0–0.5)
Eosinophils Relative: 0 %
HCT: 40.7 % (ref 36.0–46.0)
Hemoglobin: 13.1 g/dL (ref 12.0–15.0)
Immature Granulocytes: 1 %
Lymphocytes Relative: 20 %
Lymphs Abs: 2.2 10*3/uL (ref 0.7–4.0)
MCH: 32.5 pg (ref 26.0–34.0)
MCHC: 32.2 g/dL (ref 30.0–36.0)
MCV: 101 fL — ABNORMAL HIGH (ref 80.0–100.0)
Monocytes Absolute: 1.1 10*3/uL — ABNORMAL HIGH (ref 0.1–1.0)
Monocytes Relative: 10 %
Neutro Abs: 7.7 10*3/uL (ref 1.7–7.7)
Neutrophils Relative %: 68 %
Platelets: 236 10*3/uL (ref 150–400)
RBC: 4.03 MIL/uL (ref 3.87–5.11)
RDW: 14.4 % (ref 11.5–15.5)
WBC: 11.2 10*3/uL — ABNORMAL HIGH (ref 4.0–10.5)
nRBC: 0 % (ref 0.0–0.2)

## 2019-02-22 LAB — URINALYSIS, COMPLETE (UACMP) WITH MICROSCOPIC
Bacteria, UA: NONE SEEN
Bilirubin Urine: NEGATIVE
Glucose, UA: NEGATIVE mg/dL
Hgb urine dipstick: NEGATIVE
Ketones, ur: 5 mg/dL — AB
Nitrite: NEGATIVE
Protein, ur: 30 mg/dL — AB
Specific Gravity, Urine: 1.019 (ref 1.005–1.030)
pH: 6 (ref 5.0–8.0)

## 2019-02-22 MED ORDER — VITAMIN D 25 MCG (1000 UNIT) PO TABS
1000.0000 [IU] | ORAL_TABLET | Freq: Every day | ORAL | Status: DC
  Administered 2019-02-23: 09:00:00 1000 [IU] via ORAL
  Filled 2019-02-22 (×2): qty 1

## 2019-02-22 MED ORDER — ONDANSETRON HCL 4 MG/2ML IJ SOLN: 4.0000 mg | Freq: Four times a day (QID) | INTRAMUSCULAR | Status: DC | PRN

## 2019-02-22 MED ORDER — IPRATROPIUM-ALBUTEROL 0.5-2.5 (3) MG/3ML IN SOLN
3.0000 mL | Freq: Four times a day (QID) | RESPIRATORY_TRACT | Status: DC
  Administered 2019-02-22 – 2019-02-23 (×5): 3 mL via RESPIRATORY_TRACT
  Filled 2019-02-22 (×5): qty 3

## 2019-02-22 MED ORDER — SODIUM CHLORIDE 0.9 % IV SOLN
INTRAVENOUS | Status: DC
  Administered 2019-02-22 – 2019-02-23 (×3): via INTRAVENOUS

## 2019-02-22 MED ORDER — ENOXAPARIN SODIUM 40 MG/0.4ML ~~LOC~~ SOLN
40.0000 mg | SUBCUTANEOUS | Status: DC
  Administered 2019-02-22 – 2019-02-23 (×2): 40 mg via SUBCUTANEOUS
  Filled 2019-02-22 (×2): qty 0.4

## 2019-02-22 MED ORDER — DIVALPROEX SODIUM 125 MG PO CSDR
625.0000 mg | DELAYED_RELEASE_CAPSULE | Freq: Two times a day (BID) | ORAL | Status: DC
  Administered 2019-02-22 – 2019-02-23 (×3): 625 mg via ORAL
  Filled 2019-02-22 (×5): qty 5

## 2019-02-22 MED ORDER — ACETAMINOPHEN 650 MG RE SUPP: 650.0000 mg | Freq: Four times a day (QID) | RECTAL | Status: DC | PRN

## 2019-02-22 MED ORDER — FOLIC ACID 1 MG PO TABS
1.0000 mg | ORAL_TABLET | Freq: Every day | ORAL | Status: DC
  Administered 2019-02-23: 09:00:00 1 mg via ORAL
  Filled 2019-02-22 (×2): qty 1

## 2019-02-22 MED ORDER — DONEPEZIL HCL 5 MG PO TABS
5.0000 mg | ORAL_TABLET | Freq: Every day | ORAL | Status: DC
  Administered 2019-02-22 – 2019-02-23 (×2): 5 mg via ORAL
  Filled 2019-02-22 (×3): qty 1

## 2019-02-22 MED ORDER — ALBUTEROL SULFATE (2.5 MG/3ML) 0.083% IN NEBU: 2.5000 mg | INHALATION_SOLUTION | RESPIRATORY_TRACT | Status: DC | PRN

## 2019-02-22 MED ORDER — ACETAMINOPHEN 325 MG PO TABS
650.0000 mg | ORAL_TABLET | Freq: Four times a day (QID) | ORAL | Status: DC | PRN
  Administered 2019-02-22: 650 mg via ORAL
  Filled 2019-02-22: qty 2

## 2019-02-22 MED ORDER — VITAMIN B-12 1000 MCG PO TABS
1000.0000 ug | ORAL_TABLET | Freq: Every day | ORAL | Status: DC
  Administered 2019-02-23: 09:00:00 1000 ug via ORAL
  Filled 2019-02-22 (×2): qty 1

## 2019-02-22 MED ORDER — ONDANSETRON HCL 4 MG PO TABS: 4.0000 mg | ORAL_TABLET | Freq: Four times a day (QID) | ORAL | Status: DC | PRN

## 2019-02-22 MED ORDER — SODIUM CHLORIDE 0.9 % IV SOLN: 1.0000 g | Freq: Once | INTRAVENOUS | Status: DC

## 2019-02-22 MED ORDER — ATORVASTATIN CALCIUM 20 MG PO TABS
20.0000 mg | ORAL_TABLET | Freq: Every day | ORAL | Status: DC
  Administered 2019-02-23: 19:00:00 20 mg via ORAL
  Filled 2019-02-22: qty 1

## 2019-02-22 MED ORDER — LORAZEPAM 1 MG PO TABS: 1.0000 mg | ORAL_TABLET | Freq: Every day | ORAL | Status: DC | PRN

## 2019-02-22 MED ORDER — SODIUM CHLORIDE 0.9 % IV SOLN
1.0000 g | Freq: Once | INTRAVENOUS | Status: AC
  Administered 2019-02-22: 14:00:00 1 g via INTRAVENOUS
  Filled 2019-02-22: qty 10

## 2019-02-22 MED ORDER — SODIUM CHLORIDE 0.9 % IV SOLN
1.0000 g | INTRAVENOUS | Status: DC
  Administered 2019-02-23: 09:00:00 1 g via INTRAVENOUS
  Filled 2019-02-22: qty 1

## 2019-02-22 NOTE — Progress Notes (Signed)
Advanced care plan.  Purpose of the Encounter: CODE STATUS  Parties in Attendance: Patient's daughter  Patient's Decision Capacity: Not intact  Subjective/Patient's story: Deborah [name] is a 73 y.o. female with a history of bipolar, anxiety, significant dementia who presents emergency department for overall decline, concern for possible right lower extremity swelling and/or injury.  Also altered mental status  Objective/Medical story I discussed with the patient's daughter regarding overall poor prognosis.   Goals of care determination:   Patient's daughter states that she wants to continue DNR status and is willing to have her be followed by palliative care team at the facility if she goes CODE STATUS:  DO NOT RESUSCITATE prognosis poor  Time spent discussing advanced care planning: 16 minutes

## 2019-02-22 NOTE — ED Notes (Signed)
Report to Maureen, RN.

## 2019-02-22 NOTE — ED Provider Notes (Signed)
[facility] Emergency Department Provider Note  ____________________________________________   First MD Initiated Contact with Patient 02/22/19 1044     (approximate)  I have reviewed the triage vital signs and the nursing notes.  History  Chief Complaint Leg Swelling    HPI Deborah [name] is a 73 y.o. female with a history of bipolar, anxiety, significant dementia who presents emergency department for overall decline as well as concern for possible right lower extremity swelling and/or injury.  Patient was recently admitted from 9/29 to 10/2 for acute hypoxic respiratory failure due to a choking event with airway foreign body.  Daughter at bedside states before this hospitalization she was seeming to decline for the last 2 months, however after discharge she has had a more definitive rapid and significant decline. She has been unable to ambulate by herself independently, and requires at least a 2 person assist.  Daughter also states she is less talkative and interactive, which is abnormal for her.  Over the last 2 days the daughter has noticed some swelling in the right foot, and hesitancy to use the right lower extremity.  No known trauma.  History obtained from daughter and chart review, as patient is demented and does not provide any history.   Past Medical Hx Past Medical History:  Diagnosis Date  . Anxiety   . Arthritis   . Asthma   . Bacteriuria 07/31/2016  . Bipolar 1 disorder (HCC)   . Bipolar 1 disorder, mixed, severe (HCC) 07/26/2016  . Cystitis   . Dementia (HCC)   . Depression   . Heart murmur   . IBS (irritable bowel syndrome)   . Osteoporosis   . Spastic colon   . Swallowing problem 11/09/2016  . Thrombocytopenia (HCC) 08/03/2016  . Ulcer     Problem List Patient Active Problem List   Diagnosis Date Noted  . Acute respiratory failure (HCC) 02/13/2019  . Goals of care, counseling/discussion   . Palliative care by specialist   .  Acute delirium 10/03/2018  . History of stroke 10/01/2018  . Protein-calorie malnutrition, mild (HCC) 02/17/2018  . Vitamin D deficiency 06/20/2017  . Arthritis 06/17/2017  . Hx of peptic ulcer 06/17/2017  . IBS (irritable bowel syndrome) 06/17/2017  . Unintended weight loss 06/17/2017  . CKD (chronic kidney disease) stage 3, GFR 30-59 ml/min 06/17/2017  . Balance problem 11/09/2016  . Late onset Alzheimer's disease with behavioral disturbance (HCC) 11/09/2016  . Macrocytic anemia 08/03/2016  . Hx of breast cancer 07/28/2016  . Folate deficiency 10/02/2015  . Bipolar 1 disorder, depressed (HCC) 09/25/2015  . Smoker 09/25/2015  . B12 deficiency 09/11/2015  . Anxiety 07/23/2015  . Severe episode of recurrent major depressive disorder (HCC) 07/03/2015  . Dementia with behavioral disturbance (HCC) 05/29/2015  . History of migraine headaches 09/08/2014  . H/O Bell's palsy 09/08/2014  . H/O: HTN (hypertension) 09/08/2014  . Gastroesophageal reflux disease without esophagitis 09/08/2014  . COPD (chronic obstructive pulmonary disease) (HCC) 09/08/2014  . History of asthma 09/08/2014  . Breast neoplasm, Tis (LCIS) 01/23/2013    Past Surgical Hx Past Surgical History:  Procedure Laterality Date  . arm surgery Left 2011   fracture repair  . BREAST BIOPSY     LCIS  . CHOLECYSTECTOMY    . COLONOSCOPY  2002  . ELBOW SURGERY    . SHOULDER SURGERY    . SPINE SURGERY    . throat biopsy   2012  . UPPER GI ENDOSCOPY  2011  Medications Prior to Admission medications   Medication Sig Start Date End Date Taking? Authorizing Provider  albuterol (VENTOLIN HFA) 108 (90 Base) MCG/ACT inhaler Inhale 2 puffs into the lungs every 4 (four) hours as needed for wheezing or shortness of breath.    [provider]  aspirin EC 81 MG EC tablet Take 1 tablet (81 mg total) by mouth daily. Patient not taking: Reported on 02/13/2019 10/11/18   Enedina Finner, MD  atorvastatin (LIPITOR) 20 MG tablet  TAKE 1 TABLET (20 MG TOTAL) BY MOUTH DAILY AT 6 PM. 01/27/19   Reubin Milan, MD  cholecalciferol (VITAMIN D3) 25 MCG (1000 UT) tablet Take 1,000 Units by mouth daily.    [provider]  divalproex (DEPAKOTE SPRINKLE) 125 MG capsule TAKE 5 CAPSULES (625 MG TOTAL) BY MOUTH EVERY 12 (TWELVE) HOURS. 02/17/19   Reubin Milan, MD  donepezil (ARICEPT) 5 MG tablet Take 1 tablet (5 mg total) by mouth at bedtime. 12/29/18   Reubin Milan, MD  folic acid (FOLVITE) 1 MG tablet Take 1 tablet (1 mg total) by mouth daily. 12/29/18   Reubin Milan, MD  ipratropium-albuterol (DUONEB) 0.5-2.5 (3) MG/3ML SOLN Take 3 mLs by nebulization every 6 (six) hours. 02/16/19   Altamese Dilling, MD  LORazepam (ATIVAN) 1 MG tablet Take 1 tablet (1 mg total) by mouth daily as needed for anxiety. 12/29/18   Reubin Milan, MD  OLANZapine (ZYPREXA) 10 MG tablet Take 0.5-1 tablets (5-10 mg total) by mouth at bedtime. Takes 5 mg in the morning and 10 mg at bedtime Patient taking differently: Take 5-10 mg by mouth 2 (two) times daily. Takes 5 mg in the morning and 10 mg at bedtime  12/29/18   Reubin Milan, MD  vitamin B-12 (CYANOCOBALAMIN) 1000 MCG tablet Take 1,000 mcg by mouth daily.    [provider]    Allergies Prednisone and Chlorphen-pe-acetaminophen  Family Hx Family History  Problem Relation Age of Onset  . Heart attack Mother   . Cancer Sister        ovarian  . Cancer Maternal Aunt        breast  . Cancer Maternal Aunt        breast    Social Hx Social History   Tobacco Use  . Smoking status: Current Every Day Smoker    Packs/day: 1.00    Years: 20.00    Pack years: 20.00    Types: Cigarettes    Start date: 03/31/1977  . Smokeless tobacco: Never Used  Substance Use Topics  . Alcohol use: No    Alcohol/week: 0.0 standard drinks    Comment: Ocassional  . Drug use: No     Review of Systems Unable to obtain 2/2 dementia.    Physical Exam  Vital Signs:  ED Triage Vitals  Enc Vitals Group     BP 02/22/19 1023 (!) 171/66     Pulse Rate 02/22/19 1023 83     Resp 02/22/19 1023 16     Temp 02/22/19 1023 98.6 F (37 C)     Temp Source 02/22/19 1023 Oral     SpO2 02/22/19 1023 96 %     Weight 02/22/19 1028 120 lb (54.4 kg)     Height 02/22/19 1028 5\' 2"  (1.575 m)     Head Circumference --      Peak Flow --      Pain Score --      Pain Loc --  Pain Edu? --      Excl. in [place]? --     Constitutional: Awake. Demented.  Head: Normocephalic. Atraumatic. Eyes: Conjunctivae clear. Sclera anicteric. Nose: No congestion. No rhinorrhea. Mouth/Throat: Mucous membranes are dry.  Neck: No stridor.   Cardiovascular: Normal rate. Extremities well perfused. 2+ symmetric radial and DP pulses. Toes are WWP.  Respiratory: Normal respiratory effort.   Gastrointestinal: Soft. Non-tender. Non-distended.  Musculoskeletal: Swelling to the dorsum of the R foot. No obvious tenderness. She is hesitant to completely straighten the RLE and holds her hip and knee in mild flexion. No obvious deformity. Resists ranging the hip.  Neurologic:  Demented at baseline. No appreciable lateralizing deficits.  Skin: Skin is warm, dry and intact. No rash noted. Psychiatric: Mood and affect are appropriate for situation.  EKG  N/A    Radiology  CT head: IMPRESSION:  1. Stable appearing ventriculomegaly and periventricular white  matter disease. The ventricles appear dilated out of proportion to  the degree of cerebral atrophy and could not exclude a component of  communicating hydrocephalus.  2. No acute intracranial findings or mass lesion.   XR R hip: IMPRESSION:  Negative.   XR R ankle: IMPRESSION:  Negative exam.   CXR: IMPRESSION:  No acute cardiopulmonary disease.   [place]: IMPRESSION:  No evidence of deep venous thrombosis.    Procedures  Procedure(s) performed (including critical care):  Procedures   Initial Impression / Assessment and  Plan / ED Course  74 y.o. female who presents to the ED for decline above her baseline, as well as concern for potential injury to RLE due to hesitancy/resistance in using and straightening.   Ddx: electrolyte abnormality, decline 2/2 known dementia, UTI. With regards to her RLE, consider fracture, DVT.  Plan: labs, urine, imaging, US  Imaging negative for acute injuries. UA concerning for potential infection with LE, WBC and clean sample. Will send for culture and plan to treat given her change in behavior. Discussed w/ hospitalist for admission for further UTI treatment, FTT, PT/OT.     Final Clinical Impression(s) / ED Diagnosis  Final diagnoses:  Right leg swelling  Urinary tract infection in elderly patient  Failure to thrive in adult       Note:  This document was prepared using Dragon voice recognition software and may include unintentional dictation errors.   Lilia Pro., MD 02/22/19 Jeri Lager

## 2019-02-22 NOTE — ED Notes (Signed)
ED TO INPATIENT HANDOFF REPORT  ED Nurse Name and Phone #: Marisue Humble [phone]  P Name/Age/Gender Deborah [name] 73 y.o. female Room/Bed: ED19A/ED19A  Code Status   Code Status: Prior  Home/SNF/Other Home Patient oriented to: self Is this baseline? Yes   Triage Complete: Triage complete  Chief Complaint rt foot swelling hx of dementia  Triage Note Pt here with daughter. Daughter reports pt with hx of severe dementia and was recently discharged from the hospital after being intubated from choking on food. Daughter reports pt with increased weakness and edema to right LE.   Allergies Allergies  Allergen Reactions  . Prednisone Nausea And Vomiting, Nausea Only and Other (See Comments)  . Chlorphen-Pe-Acetaminophen Other (See Comments)    Reaction: unknown    Level of Care/Admitting Diagnosis ED Disposition    ED Disposition Condition Comment   Admit  The patient appears reasonably stabilized for admission considering the current resources, flow, and capabilities available in the ED at this time, and I doubt any other [facility] requiring further screening and/or treatment in the ED prior to admission is  present.       B Medical/Surgery History Past Medical History:  Diagnosis Date  . Anxiety   . Arthritis   . Asthma   . Bacteriuria 07/31/2016  . Bipolar 1 disorder (HCC)   . Bipolar 1 disorder, mixed, severe (HCC) 07/26/2016  . Cystitis   . Dementia (HCC)   . Depression   . Heart murmur   . IBS (irritable bowel syndrome)   . Osteoporosis   . Spastic colon   . Swallowing problem 11/09/2016  . Thrombocytopenia (HCC) 08/03/2016  . Ulcer    Past Surgical History:  Procedure Laterality Date  . arm surgery Left 2011   fracture repair  . BREAST BIOPSY     LCIS  . CHOLECYSTECTOMY    . COLONOSCOPY  2002  . ELBOW SURGERY    . SHOULDER SURGERY    . SPINE SURGERY    . throat biopsy   2012  . UPPER GI ENDOSCOPY  2011     A IV Location/Drains/Wounds Patient  Lines/Drains/Airways Status   Active Line/Drains/Airways    Name:   Placement date:   Placement time:   Site:   Days:   Peripheral IV 02/22/19 Right Wrist   02/22/19    1113    Wrist   less than 1          Intake/Output Last 24 hours No intake or output data in the 24 hours ending 02/22/19 1449  Labs/Imaging Results for orders placed or performed during the hospital encounter of 02/22/19 (from the past 48 hour(s))  CBC with Differential/Platelet     Status: Abnormal   Collection Time: 02/22/19 10:45 AM  Result Value Ref Range   WBC 11.2 (H) 4.0 - 10.5 K/uL   RBC 4.03 3.87 - 5.11 MIL/uL   Hemoglobin 13.1 12.0 - 15.0 g/dL   HCT 32.9 51.8 - 84.1 %   MCV 101.0 (H) 80.0 - 100.0 fL   MCH 32.5 26.0 - 34.0 pg   MCHC 32.2 30.0 - 36.0 g/dL   RDW 66.0 63.0 - 16.0 %   Platelets 236 150 - 400 K/uL   nRBC 0.0 0.0 - 0.2 %   Neutrophils Relative % 68 %   Neutro Abs 7.7 1.7 - 7.7 K/uL   Lymphocytes Relative 20 %   Lymphs Abs 2.2 0.7 - 4.0 K/uL   Monocytes Relative 10 %   Monocytes Absolute  1.1 (H) 0.1 - 1.0 K/uL   Eosinophils Relative 0 %   Eosinophils Absolute 0.0 0.0 - 0.5 K/uL   Basophils Relative 1 %   Basophils Absolute 0.1 0.0 - 0.1 K/uL   Immature Granulocytes 1 %   Abs Immature Granulocytes 0.12 (H) 0.00 - 0.07 K/uL    Comment: Performed at [facility], [address] [place], [place] [address]  Comprehensive metabolic panel     Status: Abnormal   Collection Time: 02/22/19 10:45 AM  Result Value Ref Range   Sodium 144 135 - 145 mmol/L   Potassium 4.0 3.5 - 5.1 mmol/L   Chloride 110 98 - 111 mmol/L   CO2 24 22 - 32 mmol/L   Glucose, Bld 127 (H) 70 - 99 mg/dL   BUN 23 8 - 23 mg/dL   Creatinine, Ser 1.12 (H) 0.44 - 1.00 mg/dL   Calcium 9.1 8.9 - 10.3 mg/dL   Total Protein 7.6 6.5 - 8.1 g/dL   Albumin 3.5 3.5 - 5.0 g/dL   AST 20 15 - 41 U/L   ALT 15 0 - 44 U/L   Alkaline Phosphatase 55 38 - 126 U/L   Total Bilirubin 0.5 0.3 - 1.2 mg/dL   GFR calc non Af Amer 49  (L) >60 mL/min   GFR calc Af Amer 57 (L) >60 mL/min   Anion gap 10 5 - 15    Comment: Performed at [facility], [address] [place], [place] [address]  Urinalysis, Complete w Microscopic     Status: Abnormal   Collection Time: 02/22/19 11:25 AM  Result Value Ref Range   Color, Urine YELLOW (A) YELLOW   APPearance HAZY (A) CLEAR   Specific Gravity, Urine 1.019 1.005 - 1.030   pH 6.0 5.0 - 8.0   Glucose, UA NEGATIVE NEGATIVE mg/dL   Hgb urine dipstick NEGATIVE NEGATIVE   Bilirubin Urine NEGATIVE NEGATIVE   Ketones, ur 5 (A) NEGATIVE mg/dL   Protein, ur 30 (A) NEGATIVE mg/dL   Nitrite NEGATIVE NEGATIVE   Leukocytes,Ua TRACE (A) NEGATIVE   RBC / HPF 0-5 0 - 5 RBC/hpf   WBC, UA 21-50 0 - 5 WBC/hpf   Bacteria, UA NONE SEEN NONE SEEN   Squamous Epithelial / LPF 0-5 0 - 5    Comment: Performed at [facility], [address] [place], [place] [address]   Dg Ankle Complete Right  Result Date: 02/22/2019 CLINICAL DATA:  Right lower leg edema of unknown duration. No known injury. EXAM: RIGHT ANKLE - COMPLETE 3+ VIEW COMPARISON:  None. FINDINGS: There is no evidence of fracture, dislocation, or joint effusion. There is no evidence of arthropathy or other focal bone abnormality. Soft tissues are unremarkable. IMPRESSION: Negative exam. Electronically Signed   By: Inge Rise M.D.   On: 02/22/2019 12:48   Ct Head Wo Contrast  Result Date: 02/22/2019 CLINICAL DATA:  Altered mental status.  History of severe dementia. EXAM: CT HEAD WITHOUT CONTRAST TECHNIQUE: Contiguous axial images were obtained from the base of the skull through the vertex without intravenous contrast. COMPARISON:  Head CT 11/19/2018 FINDINGS: Brain: Stable age advanced cerebral atrophy, ventriculomegaly and periventricular white matter disease. The ventricles appear dilated out of proportion to the degree of cerebral atrophy and could not exclude a component of communicating hydrocephalus. Remote left  occipital cortical infarct is noted. No new/acute intracranial findings such as hemispheric infarction or intracranial hemorrhage. No mass lesions. Stable bilateral basal ganglia calcifications and bilateral dentate nuclei calcifications  in the cerebellum. Vascular: Stable vascular calcifications. No hyperdense vessels or definite aneurysm. Skull: No skull fractures or bone lesions. Sinuses/Orbits: The paranasal sinuses and mastoid air cells are clear. The globes are intact Other: No pelvic mass or adenopathy. No free pelvic fluid collections. No inguinal mass or adenopathy. No abdominal wall hernia or subcutaneous lesions. IMPRESSION: 1. Stable appearing ventriculomegaly and periventricular white matter disease. The ventricles appear dilated out of proportion to the degree of cerebral atrophy and could not exclude a component of communicating hydrocephalus. 2. No acute intracranial findings or mass lesion. Electronically Signed   By: Rudie MeyerP.  Gallerani M.D.   On: 02/22/2019 12:57   [place] Venous Img Lower Unilateral Right  Result Date: 02/22/2019 CLINICAL DATA:  Leg swelling EXAM: RIGHT LOWER EXTREMITY VENOUS DOPPLER ULTRASOUND TECHNIQUE: Gray-scale sonography with graded compression, as well as color Doppler and duplex ultrasound were performed to evaluate the lower extremity deep venous systems from the level of the common femoral vein and including the common femoral, femoral, profunda femoral, popliteal and calf veins including the posterior tibial, peroneal and gastrocnemius veins when visible. The superficial great saphenous vein was also interrogated. Spectral Doppler was utilized to evaluate flow at rest and with distal augmentation maneuvers in the common femoral, femoral and popliteal veins. COMPARISON:  None. FINDINGS: Contralateral Common Femoral Vein: Respiratory phasicity is normal and symmetric with the symptomatic side. No evidence of thrombus. Normal compressibility. Common Femoral Vein: No evidence of  thrombus. Normal compressibility, respiratory phasicity and response to augmentation. Saphenofemoral Junction: No evidence of thrombus. Normal compressibility and flow on color Doppler imaging. Profunda Femoral Vein: No evidence of thrombus. Normal compressibility and flow on color Doppler imaging. Femoral Vein: No evidence of thrombus. Normal compressibility, respiratory phasicity and response to augmentation. Popliteal Vein: No evidence of thrombus. Normal compressibility, respiratory phasicity and response to augmentation. Calf Veins: No evidence of thrombus. Normal compressibility and flow on color Doppler imaging. Superficial Great Saphenous Vein: No evidence of thrombus. Normal compressibility. Venous Reflux:  None. Other Findings:  None. IMPRESSION: No evidence of deep venous thrombosis. Electronically Signed   By: Katherine Mantlehristopher  Green M.D.   On: 02/22/2019 12:11   Dg Chest Port 1 View  Result Date: 02/22/2019 CLINICAL DATA:  Altered mental status EXAM: PORTABLE CHEST 1 VIEW COMPARISON:  02/14/2019 FINDINGS: Interval extubation. Low lung volumes with crowding of the interstitial markings. There is no focal consolidation. There is no pleural effusion or pneumothorax. The heart and mediastinal contours are unremarkable. There is no acute osseous abnormality. IMPRESSION: No acute cardiopulmonary disease. Electronically Signed   By: Elige KoHetal  Patel   On: 02/22/2019 12:50   Dg Hip Unilat With Pelvis 2-3 Views Right  Result Date: 02/22/2019 CLINICAL DATA:  Right hip injury. EXAM: DG HIP (WITH OR WITHOUT PELVIS) 2-3V RIGHT COMPARISON:  None. FINDINGS: There is no evidence of hip fracture or dislocation. There is no evidence of arthropathy or other focal bone abnormality. IMPRESSION: Negative. Electronically Signed   By: Lupita RaiderJames  Green Jr M.D.   On: 02/22/2019 12:48    Pending Labs Unresulted Labs (From admission, onward)    Start     Ordered   02/22/19 1333  SARS CORONAVIRUS 2 (TAT 6-24 HRS) Nasopharyngeal  Nasopharyngeal Swab  (Asymptomatic/Tier 2 Patients Labs)  ONCE - STAT,   STAT    Question Answer Comment  Is this test for diagnosis or screening Screening   Symptomatic for COVID-19 as defined by CDC No   Hospitalized for COVID-19 No   Admitted to  ICU for COVID-19 No   Previously tested for COVID-19 Yes   Resident in a congregate (group) care setting No   Employed in healthcare setting No   Pregnant No      02/22/19 1332   02/22/19 1201  Urine culture  Add-on,   AD     02/22/19 1200          Vitals/Pain Today's Vitals   02/22/19 1153 02/22/19 1300 02/22/19 1330 02/22/19 1430  BP: (!) 175/77 (!) 178/75 (!) 177/72 (!) 189/72  Pulse: 75 83 82 79  Resp: 12  11 (!) 25  Temp:      TempSrc:      SpO2: 98% 98% 99% 97%  Weight:      Height:        Isolation Precautions No active isolations  Medications Medications  cefTRIAXone (ROCEPHIN) 1 g in sodium chloride 0.9 % 100 mL IVPB (1 g Intravenous New Bag/Given 02/22/19 1353)    Mobility non-ambulatory High fall risk   Focused Assessments See assessments   R Recommendations: See Admitting Provider Note  Report given to:   Additional Notes: Right foot remains swollen. Pt appears in pain if right leg is moved.

## 2019-02-22 NOTE — Progress Notes (Signed)
Per daughter, Adult Social Services Case Worker, Milus Glazier has assisted patient and family recently. If needed Ms. Ronnald Ramp can speak with CSW/CNM with SNF placement if needed.   Fuller Mandril, RN

## 2019-02-22 NOTE — ED Notes (Signed)
Pt minimally responsive, family at bedside. Pt sleeping during time this RN in room. Family says this is how pt has been since this past Friday.

## 2019-02-22 NOTE — ED Triage Notes (Signed)
Pt here with daughter. Daughter reports pt with hx of severe dementia and was recently discharged from the hospital after being intubated from choking on food. Daughter reports pt with increased weakness and edema to right LE.

## 2019-02-22 NOTE — ED Notes (Signed)
Patient transported to CT 

## 2019-02-22 NOTE — ED Notes (Signed)
Pt leaning towards left side in bed. Family reports that has been unable to bear weight since discharged from hospital on Friday, nonverbal since discharge and has had right foot and leg swelling since discharge. Pt grimaces when moving right foot. Equal strength to bilateral upper extremities, strength to left leg. Pt unable to follow commands as well since Friday. Daughter at bedside.

## 2019-02-22 NOTE — ED Notes (Signed)
Patient transported to X-ray 

## 2019-02-22 NOTE — Chronic Care Management (AMB) (Addendum)
Chronic Care Management   Follow Up Note   02/22/2019 Name: MADIHA BAMBRICK MRN: [id number] DOB: 26-Mar-1946  Referred by: Glean Hess, MD Reason for referral : Care Coordination (Message to ED LCSW)   CHRISTABELL LOSEKE is a 73 y.o. year old female who is a primary care patient of Army Melia Jesse Sans, MD. The CCM team was consulted for assistance with chronic disease management and care coordination needs.    Review of patient status, including review of consultants reports, relevant laboratory and other test results, and collaboration with appropriate care team members and the patient's provider was performed as part of comprehensive patient evaluation and provision of chronic care management services.    SDOH (Social Determinants of Health) screening performed today: None. See Care Plan for related entries.   Advanced Directives Status: N See Care Plan and Vynca application for related entries.  Facility-Administered Encounter Medications as of 02/22/2019  Medication  . [START ON 02/23/2019] cefTRIAXone (ROCEPHIN) 1 g in sodium chloride 0.9 % 100 mL IVPB   Outpatient Encounter Medications as of 02/22/2019  Medication Sig  . albuterol (VENTOLIN HFA) 108 (90 Base) MCG/ACT inhaler Inhale 2 puffs into the lungs every 4 (four) hours as needed for wheezing or shortness of breath.  Marland Kitchen aspirin EC 81 MG EC tablet Take 1 tablet (81 mg total) by mouth daily. (Patient not taking: Reported on 02/13/2019)  . atorvastatin (LIPITOR) 20 MG tablet TAKE 1 TABLET (20 MG TOTAL) BY MOUTH DAILY AT 6 PM.  . cholecalciferol (VITAMIN D3) 25 MCG (1000 UT) tablet Take 1,000 Units by mouth daily.  . divalproex (DEPAKOTE SPRINKLE) 125 MG capsule TAKE 5 CAPSULES (625 MG TOTAL) BY MOUTH EVERY 12 (TWELVE) HOURS.  Marland Kitchen donepezil (ARICEPT) 5 MG tablet Take 1 tablet (5 mg total) by mouth at bedtime.  . folic acid (FOLVITE) 1 MG tablet Take 1 tablet (1 mg total) by mouth daily.  Marland Kitchen ipratropium-albuterol (DUONEB) 0.5-2.5 (3)  MG/3ML SOLN Take 3 mLs by nebulization every 6 (six) hours.  Marland Kitchen LORazepam (ATIVAN) 1 MG tablet Take 1 tablet (1 mg total) by mouth daily as needed for anxiety.  Marland Kitchen OLANZapine (ZYPREXA) 10 MG tablet Take 0.5-1 tablets (5-10 mg total) by mouth at bedtime. Takes 5 mg in the morning and 10 mg at bedtime (Patient taking differently: Take 5-10 mg by mouth See admin instructions. Take  tablet (5mg ) by mouth every morning and take 1 tablet (10mg ) by mouth every night)  . vitamin B-12 (CYANOCOBALAMIN) 1000 MCG tablet Take 1,000 mcg by mouth daily.     Goals Addressed            This Visit's Progress   . LTC placement goals (pt-stated)       Current Barriers:  . Cognitive Deficits  Nurse Case Manager Clinical Goal(s):  Marland Kitchen Over the next 30 days, patient will meet with RN Care Manager to address family/ caregiver  needs   Interventions:  . Collaborated with ED LCSW Tania Polidor regarding patient's family/caregiver goals to get patient into long term care related to care being unmanagable at home. Tania made me aware patient is to be admitted to Medical Floor and she would pass this information along to Floor CM/SW.    Patient Self Care Activities:  . Have not assessed  . Have not assessed. EMR reveals this patient is unable to to do ADLs or IADLS  Initial goal documentation         The care management team will reach out  to the patient again over the next 14 days.    Costella Hatcher RN, Engineer, technical sales [facility] Care Management  [phone]) Business Mobile

## 2019-02-22 NOTE — H&P (Signed)
[employer] [place] [facility]   PATIENT NAME: Deborah [name]    MR#:  [id number]  DATE OF BIRTH:  11-10-1945  DATE OF ADMISSION:  02/22/2019  PRIMARY CARE PHYSICIAN: Reubin Milan, MD   REQUESTING/REFERRING PHYSICIAN: Miguel Aschoff., MD  CHIEF COMPLAINT:   Chief Complaint  Patient presents with  . Leg Swelling    HISTORY OF PRESENT ILLNESS: Deborah [name]  is a 73 y.o. female with a known history of dementia, arthritis, asthma, bipolar 1 disorder, depression, recent admission for aspiration and aspiration pneumonia.  Who is discharged last week brought back by family due to patient not able to get out of bed.  She is not also communicating since last week.  According to the family patient is noted to have right lower extremity swelling and whenever they try to move her patient seems to be in pain.     PAST MEDICAL HISTORY:   Past Medical History:  Diagnosis Date  . Anxiety   . Arthritis   . Asthma   . Bacteriuria 07/31/2016  . Bipolar 1 disorder (HCC)   . Bipolar 1 disorder, mixed, severe (HCC) 07/26/2016  . Cystitis   . Dementia (HCC)   . Depression   . Heart murmur   . IBS (irritable bowel syndrome)   . Osteoporosis   . Spastic colon   . Swallowing problem 11/09/2016  . Thrombocytopenia (HCC) 08/03/2016  . Ulcer     PAST SURGICAL HISTORY:  Past Surgical History:  Procedure Laterality Date  . arm surgery Left 2011   fracture repair  . BREAST BIOPSY     LCIS  . CHOLECYSTECTOMY    . COLONOSCOPY  2002  . ELBOW SURGERY    . SHOULDER SURGERY    . SPINE SURGERY    . throat biopsy   2012  . UPPER GI ENDOSCOPY  2011    SOCIAL HISTORY:  Social History   Tobacco Use  . Smoking status: Current Every Day Smoker    Packs/day: 1.00    Years: 20.00    Pack years: 20.00    Types: Cigarettes    Start date: 03/31/1977  . Smokeless tobacco: Never Used  Substance Use Topics  . Alcohol use: No    Alcohol/week: 0.0 standard drinks    Comment:  Ocassional    FAMILY HISTORY:  Family History  Problem Relation Age of Onset  . Heart attack Mother   . Cancer Sister        ovarian  . Cancer Maternal Aunt        breast  . Cancer Maternal Aunt        breast    DRUG ALLERGIES:  Allergies  Allergen Reactions  . Prednisone Nausea And Vomiting, Nausea Only and Other (See Comments)  . Chlorphen-Pe-Acetaminophen Other (See Comments)    Reaction: unknown    REVIEW OF SYSTEMS:   CONSTITUTIONAL: Patient unable to provide due to dementia    MEDICATIONS AT HOME:  Prior to Admission medications   Medication Sig Start Date End Date Taking? Authorizing Provider  albuterol (VENTOLIN HFA) 108 (90 Base) MCG/ACT inhaler Inhale 2 puffs into the lungs every 4 (four) hours as needed for wheezing or shortness of breath.   Yes [provider]  atorvastatin (LIPITOR) 20 MG tablet TAKE 1 TABLET (20 MG TOTAL) BY MOUTH DAILY AT 6 PM. 01/27/19  Yes Reubin Milan, MD  cholecalciferol (VITAMIN D3) 25 MCG (1000 UT) tablet Take 1,000 Units by mouth daily.  Yes [provider]  divalproex (DEPAKOTE SPRINKLE) 125 MG capsule TAKE 5 CAPSULES (625 MG TOTAL) BY MOUTH EVERY 12 (TWELVE) HOURS. 02/17/19  Yes Reubin Milan, MD  donepezil (ARICEPT) 5 MG tablet Take 1 tablet (5 mg total) by mouth at bedtime. 12/29/18  Yes Reubin Milan, MD  folic acid (FOLVITE) 1 MG tablet Take 1 tablet (1 mg total) by mouth daily. 12/29/18  Yes Reubin Milan, MD  ipratropium-albuterol (DUONEB) 0.5-2.5 (3) MG/3ML SOLN Take 3 mLs by nebulization every 6 (six) hours. 02/16/19  Yes Altamese Dilling, MD  LORazepam (ATIVAN) 1 MG tablet Take 1 tablet (1 mg total) by mouth daily as needed for anxiety. 12/29/18  Yes Reubin Milan, MD  OLANZapine (ZYPREXA) 10 MG tablet Take 0.5-1 tablets (5-10 mg total) by mouth at bedtime. Takes 5 mg in the morning and 10 mg at bedtime Patient taking differently: Take 5-10 mg by mouth See admin instructions. Take   tablet (5mg ) by mouth every morning and take 1 tablet (10mg ) by mouth every night 12/29/18  Yes , MD  vitamin B-12 (CYANOCOBALAMIN) 1000 MCG tablet Take 1,000 mcg by mouth daily.   Yes [provider]  aspirin EC 81 MG EC tablet Take 1 tablet (81 mg total) by mouth daily. Patient not taking: Reported on 02/13/2019 10/11/18   02/15/2019, MD      PHYSICAL EXAMINATION:   VITAL SIGNS: Blood pressure (!) 178/75, pulse 83, temperature 98.6 F (37 C), temperature source Oral, resp. rate 12, height 5\' 2"  (1.575 m), weight 54.4 kg, SpO2 98 %.  GENERAL:  73 y.o.-year-old patient lying in the bed with no acute distress.  EYES: Pupils equal, round, reactive to light and accommodation. No scleral icterus. Extraocular muscles intact.  HEENT: Head atraumatic, normocephalic. Oropharynx and nasopharynx clear.  NECK:  Supple, no jugular venous distention. No thyroid enlargement, no tenderness.  LUNGS: Normal breath sounds bilaterally, no wheezing, rales,rhonchi or crepitation. No use of accessory muscles of respiration.  CARDIOVASCULAR: S1, S2 normal. No murmurs, rubs, or gallops.  ABDOMEN: Soft, nontender, nondistended. Bowel sounds present. No organomegaly or mass.  EXTREMITIES: No pedal edema, cyanosis, or clubbing.  NEUROLOGIC: Cranial nerves II through XII are intact. Muscle strength 5/5 in all extremities. Sensation intact. Gait not checked.  PSYCHIATRIC: pt confused unable to provide SKIN: No obvious rash, lesion, or ulcer.   LABORATORY PANEL:   CBC Recent Labs  Lab 02/22/19 1045  WBC 11.2*  HGB 13.1  HCT 40.7  PLT 236  MCV 101.0*  MCH 32.5  MCHC 32.2  RDW 14.4  LYMPHSABS 2.2  MONOABS 1.1*  EOSABS 0.0  BASOSABS 0.1   ------------------------------------------------------------------------------------------------------------------  Chemistries  Recent Labs  Lab 02/22/19 1045  NA 144  K 4.0  CL 110  CO2 24  GLUCOSE 127*  BUN 23  CREATININE 1.12*   CALCIUM 9.1  AST 20  ALT 15  ALKPHOS 55  BILITOT 0.5   ------------------------------------------------------------------------------------------------------------------ estimated creatinine clearance is 35.9 mL/min (A) (by C-G formula based on SCr of 1.12 mg/dL (H)). ------------------------------------------------------------------------------------------------------------------ No results for input(s): TSH, T4TOTAL, T3FREE, THYROIDAB in the last 72 hours.  Invalid input(s): FREET3   Coagulation profile No results for input(s): INR, PROTIME in the last 168 hours. ------------------------------------------------------------------------------------------------------------------- No results for input(s): DDIMER in the last 72 hours. -------------------------------------------------------------------------------------------------------------------  Cardiac Enzymes No results for input(s): CKMB, TROPONINI, MYOGLOBIN in the last 168 hours.  Invalid input(s): CK ------------------------------------------------------------------------------------------------------------------ Invalid input(s): POCBNP  ---------------------------------------------------------------------------------------------------------------  Urinalysis  Component Value Date/Time   COLORURINE YELLOW (A) 02/22/2019 1125   APPEARANCEUR HAZY (A) 02/22/2019 1125   LABSPEC 1.019 02/22/2019 1125   PHURINE 6.0 02/22/2019 1125   GLUCOSEU NEGATIVE 02/22/2019 1125   HGBUR NEGATIVE 02/22/2019 1125   BILIRUBINUR NEGATIVE 02/22/2019 1125   KETONESUR 5 (A) 02/22/2019 1125   PROTEINUR 30 (A) 02/22/2019 1125   NITRITE NEGATIVE 02/22/2019 1125   LEUKOCYTESUR TRACE (A) 02/22/2019 1125     RADIOLOGY: Dg Ankle Complete Right  Result Date: 02/22/2019 CLINICAL DATA:  Right lower leg edema of unknown duration. No known injury. EXAM: RIGHT ANKLE - COMPLETE 3+ VIEW COMPARISON:  None. FINDINGS: There is no evidence of  fracture, dislocation, or joint effusion. There is no evidence of arthropathy or other focal bone abnormality. Soft tissues are unremarkable. IMPRESSION: Negative exam. Electronically Signed   By: Inge Rise M.D.   On: 02/22/2019 12:48   Ct Head Wo Contrast  Result Date: 02/22/2019 CLINICAL DATA:  Altered mental status.  History of severe dementia. EXAM: CT HEAD WITHOUT CONTRAST TECHNIQUE: Contiguous axial images were obtained from the base of the skull through the vertex without intravenous contrast. COMPARISON:  Head CT 11/19/2018 FINDINGS: Brain: Stable age advanced cerebral atrophy, ventriculomegaly and periventricular white matter disease. The ventricles appear dilated out of proportion to the degree of cerebral atrophy and could not exclude a component of communicating hydrocephalus. Remote left occipital cortical infarct is noted. No new/acute intracranial findings such as hemispheric infarction or intracranial hemorrhage. No mass lesions. Stable bilateral basal ganglia calcifications and bilateral dentate nuclei calcifications in the cerebellum. Vascular: Stable vascular calcifications. No hyperdense vessels or definite aneurysm. Skull: No skull fractures or bone lesions. Sinuses/Orbits: The paranasal sinuses and mastoid air cells are clear. The globes are intact Other: No pelvic mass or adenopathy. No free pelvic fluid collections. No inguinal mass or adenopathy. No abdominal wall hernia or subcutaneous lesions. IMPRESSION: 1. Stable appearing ventriculomegaly and periventricular white matter disease. The ventricles appear dilated out of proportion to the degree of cerebral atrophy and could not exclude a component of communicating hydrocephalus. 2. No acute intracranial findings or mass lesion. Electronically Signed   By: Marijo Sanes M.D.   On: 02/22/2019 12:57   US Venous Img Lower Unilateral Right  Result Date: 02/22/2019 CLINICAL DATA:  Leg swelling EXAM: RIGHT LOWER EXTREMITY VENOUS  DOPPLER ULTRASOUND TECHNIQUE: Gray-scale sonography with graded compression, as well as color Doppler and duplex ultrasound were performed to evaluate the lower extremity deep venous systems from the level of the common femoral vein and including the common femoral, femoral, profunda femoral, popliteal and calf veins including the posterior tibial, peroneal and gastrocnemius veins when visible. The superficial great saphenous vein was also interrogated. Spectral Doppler was utilized to evaluate flow at rest and with distal augmentation maneuvers in the common femoral, femoral and popliteal veins. COMPARISON:  None. FINDINGS: Contralateral Common Femoral Vein: Respiratory phasicity is normal and symmetric with the symptomatic side. No evidence of thrombus. Normal compressibility. Common Femoral Vein: No evidence of thrombus. Normal compressibility, respiratory phasicity and response to augmentation. Saphenofemoral Junction: No evidence of thrombus. Normal compressibility and flow on color Doppler imaging. Profunda Femoral Vein: No evidence of thrombus. Normal compressibility and flow on color Doppler imaging. Femoral Vein: No evidence of thrombus. Normal compressibility, respiratory phasicity and response to augmentation. Popliteal Vein: No evidence of thrombus. Normal compressibility, respiratory phasicity and response to augmentation. Calf Veins: No evidence of thrombus. Normal compressibility and flow on color Doppler imaging. Superficial  Great Saphenous Vein: No evidence of thrombus. Normal compressibility. Venous Reflux:  None. Other Findings:  None. IMPRESSION: No evidence of deep venous thrombosis. Electronically Signed   By: Katherine Mantlehristopher  Green M.D.   On: 02/22/2019 12:11   Dg Chest Port 1 View  Result Date: 02/22/2019 CLINICAL DATA:  Altered mental status EXAM: PORTABLE CHEST 1 VIEW COMPARISON:  02/14/2019 FINDINGS: Interval extubation. Low lung volumes with crowding of the interstitial markings. There is  no focal consolidation. There is no pleural effusion or pneumothorax. The heart and mediastinal contours are unremarkable. There is no acute osseous abnormality. IMPRESSION: No acute cardiopulmonary disease. Electronically Signed   By: Elige KoHetal  Mickie Badders   On: 02/22/2019 12:50   Dg Hip Unilat With Pelvis 2-3 Views Right  Result Date: 02/22/2019 CLINICAL DATA:  Right hip injury. EXAM: DG HIP (WITH OR WITHOUT PELVIS) 2-3V RIGHT COMPARISON:  None. FINDINGS: There is no evidence of hip fracture or dislocation. There is no evidence of arthropathy or other focal bone abnormality. IMPRESSION: Negative. Electronically Signed   By: Lupita RaiderJames  Green Jr M.D.   On: 02/22/2019 12:48    EKG: Orders placed or performed during the hospital encounter of 02/13/19  . EKG 12-Lead  . EKG 12-Lead    IMPRESSION AND PLAN: Patient is 73 year old recently discharged home brought back  1.  Acute encephalopathy suspect this is due to UTI which appears to be minimal based on her UA, also I believe patient's dementia is progressively getting worse. Will treat with IV fluids and IV antibiotics  2.  Dementia with progression continue Ativan Zyprexa and Depakote   3.  Generalized weakness and deconditioning family unable to take care of her they would like to place her  4.  Hyperlipidemia continue Lipitor  5.  Miscellaneous Lovenox for DVT prophylaxis   All the records are reviewed and case discussed with ED provider. Management plans discussed with the patient, family and they are in agreement.  CODE STATUS: Code Status History    Date Active Date Inactive Code Status Order ID Comments User Context   02/14/2019 1227 02/16/2019 2112 DNR [id number]  Erin FullingKasa, Kurian, MD Inpatient   02/13/2019 1617 02/14/2019 1227 Full Code [id number]  Ramonita LabGouru, Aruna, MD ED   10/06/2018 1237 10/10/2018 2102 DNR [id number]  Joylene DraftShaffer, Shae Lee N, NP Inpatient   10/01/2018 1702 10/06/2018 1237 Partial Code [id number]  Mayo, Allyn KennerKaty Dodd, MD ED   09/25/2015 2034  09/26/2015 1728 Full Code [id number]  Clapacs, Jackquline DenmarkJohn T, MD Inpatient   Advance Care Planning Activity    Questions for Most Recent Historical Code Status (Order [id number])    Question Answer Comment   In the event of cardiac or respiratory ARREST Do not call a "code blue"    In the event of cardiac or respiratory ARREST Do not perform Intubation, CPR, defibrillation or ACLS    In the event of cardiac or respiratory ARREST Use medication by any route, position, wound care, and other measures to relive pain and suffering. May use oxygen, suction and manual treatment of airway obstruction as needed for comfort.        TOTAL TIME TAKING CARE OF THIS PATIENT:55 minutes.    Auburn BilberryShreyang Demeisha Geraghty M.D on 02/22/2019 at 2:16 PM  Between 7am to 6pm - Pager - [phone]  After 6pm go to [email or web address] - password [email or web address]  [employer] Office  [phone]  CC: Primary care physician; Reubin MilanBerglund, Laura H, MD

## 2019-02-22 NOTE — ED Notes (Signed)
ED Provider at bedside. 

## 2019-02-23 ENCOUNTER — Encounter: Payer: Self-pay | Admitting: Primary Care

## 2019-02-23 DIAGNOSIS — Z7189 Other specified counseling: Secondary | ICD-10-CM

## 2019-02-23 DIAGNOSIS — R627 Adult failure to thrive: Secondary | ICD-10-CM

## 2019-02-23 DIAGNOSIS — Z515 Encounter for palliative care: Secondary | ICD-10-CM

## 2019-02-23 LAB — BASIC METABOLIC PANEL
Anion gap: 11 (ref 5–15)
BUN: 19 mg/dL (ref 8–23)
CO2: 24 mmol/L (ref 22–32)
Calcium: 8.6 mg/dL — ABNORMAL LOW (ref 8.9–10.3)
Chloride: 107 mmol/L (ref 98–111)
Creatinine, Ser: 0.96 mg/dL (ref 0.44–1.00)
GFR calc Af Amer: >60 mL/min (ref >60)
GFR calc non Af Amer: 59 mL/min — ABNORMAL LOW (ref >60)
Glucose, Bld: 111 mg/dL — ABNORMAL HIGH (ref 70–99)
Potassium: 3.8 mmol/L (ref 3.5–5.1)
Sodium: 142 mmol/L (ref 135–145)

## 2019-02-23 LAB — CBC
HCT: 40.2 % (ref 36.0–46.0)
Hemoglobin: 12.9 g/dL (ref 12.0–15.0)
MCH: 32.4 pg (ref 26.0–34.0)
MCHC: 32.1 g/dL (ref 30.0–36.0)
MCV: 101 fL — ABNORMAL HIGH (ref 80.0–100.0)
Platelets: 227 10*3/uL (ref 150–400)
RBC: 3.98 MIL/uL (ref 3.87–5.11)
RDW: 14 % (ref 11.5–15.5)
WBC: 11.1 10*3/uL — ABNORMAL HIGH (ref 4.0–10.5)
nRBC: 0 % (ref 0.0–0.2)

## 2019-02-23 LAB — URINE CULTURE: Culture: NO GROWTH

## 2019-02-23 MED ORDER — ENSURE ENLIVE PO LIQD
237.0000 mL | Freq: Two times a day (BID) | ORAL | Status: DC
  Administered 2019-02-23: 16:00:00 237 mL via ORAL

## 2019-02-23 MED ORDER — OLANZAPINE 5 MG PO TABS
5.0000 mg | ORAL_TABLET | Freq: Every day | ORAL | Status: DC
  Filled 2019-02-23: qty 1

## 2019-02-23 MED ORDER — OLANZAPINE 10 MG PO TABS
10.0000 mg | ORAL_TABLET | Freq: Every day | ORAL | Status: DC
  Administered 2019-02-23: 10 mg via ORAL
  Filled 2019-02-23 (×2): qty 1

## 2019-02-23 MED ORDER — ADULT MULTIVITAMIN W/MINERALS CH
1.0000 | ORAL_TABLET | Freq: Every day | ORAL | Status: DC
  Filled 2019-02-23: qty 1

## 2019-02-23 NOTE — TOC Initial Note (Signed)
Transition of Care [facility]) - Initial/Assessment Note    Patient Details  Name: Deborah [name] MRN: [id number] Date of Birth: 12/15/1945  Transition of Care [facility]) CM/SW Contact:    Beverly Sessions, RN Phone Number: 02/23/2019, 4:45 PM  Clinical Narrative:                 Patient admitted from home with UTI  Assessment completed via phone with patients daughter Velta Addison who is gaurdian  Patient lives at home with husband, daughter, son in law and 4 grandchildren Patient is total care.  Family has been providing 24/7 care  Previous admission patient received a WC in the home.   Family expressing desire for long term placement.   Daughter is working with Secondary school teacher to initiate process of obtaining Medicaid.  Patient does not have a long term care payor at this time  Daughter is aware that there is no payor source currently for long term care. RNCM notified daughter that at this time the option would be private pay at long term care, or private pay for [facility] services.  PCS list emailed to daughter  Currently there is no skilled need for patient to go to SNF for short term rehab.  At discharge last week PT evaluated and did not feel that patient was appropriate for rehab. Family requesting for PT to see patient this admission to see if it would be appropriate.  MD has order PT eval   Daughter provided me with Milus Glazier contact information from DSS - she states that there is not a currently APS case, she has connected with the family as an "out reach".  She also inquires about long term care placement and I provided her with the information above  Daughter reached out to Lead RNCM to see if any additional resources were available.  Lead RNCM reiterated that in patients current state she is not a candidate for STR, and LTC would be an out of pocket expense.  Lead RNCM assured family that team will continue to work on discharge disposition.  Palliative consult and PT eval still  pending  UPDATE:  Palliative NP has discussed disposition with daughter.  Family is now considering home with hospice.  Daughter states that she needs to discuss with her sister.  Daughter states if they do decide to go with hospice they would like Manufacturing engineer    Expected Discharge Plan: [place]     Patient Goals and CMS Choice        Expected Discharge Plan and Services Expected Discharge Plan: [place]   Discharge Planning Services: CM Consult   Living arrangements for the past 2 months: Single Family Home                                      Prior Living Arrangements/Services Living arrangements for the past 2 months: Single Family Home Lives with:: Adult Children, Spouse              Current home services: DME    Activities of Daily Living Home Assistive Devices/Equipment: Dentures (specify type), Wheelchair ADL Screening (condition at time of admission) Patient's cognitive ability adequate to safely complete daily activities?: No Is the patient deaf or have difficulty hearing?: No Does the patient have difficulty seeing, even when wearing glasses/contacts?: No Does the patient have difficulty concentrating, remembering, or making decisions?: Yes Patient able  to express need for assistance with ADLs?: No Does the patient have difficulty dressing or bathing?: Yes Independently performs ADLs?: No Communication: Dependent Is this a change from baseline?: Pre-admission baseline Dressing (OT): Dependent Is this a change from baseline?: Pre-admission baseline Grooming: Dependent Is this a change from baseline?: Pre-admission baseline Feeding: Dependent Is this a change from baseline?: Pre-admission baseline Bathing: Dependent Is this a change from baseline?: Pre-admission baseline Toileting: Dependent Is this a change from baseline?: Pre-admission baseline In/Out Bed: Dependent Is this a change from baseline?:  Pre-admission baseline Walks in Home: Dependent Is this a change from baseline?: Pre-admission baseline Does the patient have difficulty walking or climbing stairs?: Yes Weakness of Legs: Both Weakness of Arms/Hands: Both  Permission Sought/Granted                  Emotional Assessment              Admission diagnosis:  Right leg swelling [M79.89] Patient Active Problem List   Diagnosis Date Noted  . Failure to thrive in adult   . Encounter for hospice care discussion   . DNR (do not resuscitate) discussion   . UTI (urinary tract infection) 02/22/2019  . Acute respiratory failure (HCC) 02/13/2019  . Goals of care, counseling/discussion   . Palliative care by specialist   . Acute delirium 10/03/2018  . History of stroke 10/01/2018  . Protein-calorie malnutrition, mild (HCC) 02/17/2018  . Vitamin D deficiency 06/20/2017  . Arthritis 06/17/2017  . Hx of peptic ulcer 06/17/2017  . IBS (irritable bowel syndrome) 06/17/2017  . Unintended weight loss 06/17/2017  . CKD (chronic kidney disease) stage 3, GFR 30-59 ml/min 06/17/2017  . Balance problem 11/09/2016  . Late onset Alzheimer's disease with behavioral disturbance (HCC) 11/09/2016  . Macrocytic anemia 08/03/2016  . Hx of breast cancer 07/28/2016  . Folate deficiency 10/02/2015  . Bipolar 1 disorder, depressed (HCC) 09/25/2015  . Smoker 09/25/2015  . B12 deficiency 09/11/2015  . Anxiety 07/23/2015  . Severe episode of recurrent major depressive disorder (HCC) 07/03/2015  . Dementia with behavioral disturbance (HCC) 05/29/2015  . History of migraine headaches 09/08/2014  . H/O Bell's palsy 09/08/2014  . H/O: HTN (hypertension) 09/08/2014  . Gastroesophageal reflux disease without esophagitis 09/08/2014  . COPD (chronic obstructive pulmonary disease) (HCC) 09/08/2014  . History of asthma 09/08/2014  . Breast neoplasm, Tis (LCIS) 01/23/2013   PCP:  Reubin Milan, MD Pharmacy:   [facility] [address],  [place] - [address] [place] [address] Phone: [phone] Fax: [phone]     Social Determinants of Health (SDOH) Interventions    Readmission Risk Interventions Readmission Risk Prevention Plan 02/23/2019 02/15/2019  Transportation Screening Complete Complete  Palliative Care Screening Complete -  Medication Review (RN Care Manager) Complete Complete  Some recent data might be hidden

## 2019-02-23 NOTE — Progress Notes (Signed)
[facility] [facility] [place] [facility]   PATIENT NAME: Deborah [name]    MR#:  [id number]  DATE OF BIRTH:  04-19-1946  SUBJECTIVE:  CHIEF COMPLAINT:   Chief Complaint  Patient presents with  . Leg Swelling   Confused.  Poor oral intake.  Afebrile.  REVIEW OF SYSTEMS:    Review of Systems  Unable to perform ROS: Dementia   DRUG ALLERGIES:   Allergies  Allergen Reactions  . Prednisone Nausea And Vomiting, Nausea Only and Other (See Comments)  . Chlorphen-Pe-Acetaminophen Other (See Comments)    Reaction: unknown    VITALS:  Blood pressure (!) 155/82, pulse 86, temperature 98.9 F (37.2 C), temperature source Oral, resp. rate 16, height 5\' 2"  (1.575 m), weight 54.4 kg, SpO2 98 %.  PHYSICAL EXAMINATION:   Physical Exam  GENERAL:  73 y.o.-year-old patient lying in the bed with no acute distress.  EYES: Pupils equal, round, reactive to light and accommodation. No scleral icterus. Extraocular muscles intact.  HEENT: Head atraumatic, normocephalic. Oropharynx and nasopharynx clear.  NECK:  Supple, no jugular venous distention. No thyroid enlargement, no tenderness.  LUNGS: Normal breath sounds bilaterally, no wheezing, rales, rhonchi. No use of accessory muscles of respiration.  CARDIOVASCULAR: S1, S2 normal. No murmurs, rubs, or gallops.  ABDOMEN: Soft, nontender, nondistended. Bowel sounds present. No organomegaly or mass.  EXTREMITIES: No cyanosis, clubbing or edema b/l.    NEUROLOGIC: Not following instructions.  Sleeping in fetal position. PSYCHIATRIC: The patient is alert and awake.  Confused. SKIN: No obvious rash, lesion, or ulcer.   LABORATORY PANEL:   CBC Recent Labs  Lab 02/23/19 0353  WBC 11.1*  HGB 12.9  HCT 40.2  PLT 227   ------------------------------------------------------------------------------------------------------------------ Chemistries  Recent Labs  Lab 02/22/19 1045 02/23/19 0353  NA 144 142  K 4.0 3.8  CL 110 107   CO2 24 24  GLUCOSE 127* 111*  BUN 23 19  CREATININE 1.12* 0.96  CALCIUM 9.1 8.6*  AST 20  --   ALT 15  --   ALKPHOS 55  --   BILITOT 0.5  --    ------------------------------------------------------------------------------------------------------------------  Cardiac Enzymes No results for input(s): TROPONINI in the last 168 hours. ------------------------------------------------------------------------------------------------------------------  RADIOLOGY:  Dg Ankle Complete Right  Result Date: 02/22/2019 CLINICAL DATA:  Right lower leg edema of unknown duration. No known injury. EXAM: RIGHT ANKLE - COMPLETE 3+ VIEW COMPARISON:  None. FINDINGS: There is no evidence of fracture, dislocation, or joint effusion. There is no evidence of arthropathy or other focal bone abnormality. Soft tissues are unremarkable. IMPRESSION: Negative exam. Electronically Signed   By: Drusilla Kannerhomas  Dalessio M.D.   On: 02/22/2019 12:48   Ct Head Wo Contrast  Result Date: 02/22/2019 CLINICAL DATA:  Altered mental status.  History of severe dementia. EXAM: CT HEAD WITHOUT CONTRAST TECHNIQUE: Contiguous axial images were obtained from the base of the skull through the vertex without intravenous contrast. COMPARISON:  Head CT 11/19/2018 FINDINGS: Brain: Stable age advanced cerebral atrophy, ventriculomegaly and periventricular white matter disease. The ventricles appear dilated out of proportion to the degree of cerebral atrophy and could not exclude a component of communicating hydrocephalus. Remote left occipital cortical infarct is noted. No new/acute intracranial findings such as hemispheric infarction or intracranial hemorrhage. No mass lesions. Stable bilateral basal ganglia calcifications and bilateral dentate nuclei calcifications in the cerebellum. Vascular: Stable vascular calcifications. No hyperdense vessels or definite aneurysm. Skull: No skull fractures or bone lesions. Sinuses/Orbits: The paranasal sinuses and  mastoid air  cells are clear. The globes are intact Other: No pelvic mass or adenopathy. No free pelvic fluid collections. No inguinal mass or adenopathy. No abdominal wall hernia or subcutaneous lesions. IMPRESSION: 1. Stable appearing ventriculomegaly and periventricular white matter disease. The ventricles appear dilated out of proportion to the degree of cerebral atrophy and could not exclude a component of communicating hydrocephalus. 2. No acute intracranial findings or mass lesion. Electronically Signed   By: Rudie Meyer M.D.   On: 02/22/2019 12:57   US Venous Img Lower Unilateral Right  Result Date: 02/22/2019 CLINICAL DATA:  Leg swelling EXAM: RIGHT LOWER EXTREMITY VENOUS DOPPLER ULTRASOUND TECHNIQUE: Gray-scale sonography with graded compression, as well as color Doppler and duplex ultrasound were performed to evaluate the lower extremity deep venous systems from the level of the common femoral vein and including the common femoral, femoral, profunda femoral, popliteal and calf veins including the posterior tibial, peroneal and gastrocnemius veins when visible. The superficial great saphenous vein was also interrogated. Spectral Doppler was utilized to evaluate flow at rest and with distal augmentation maneuvers in the common femoral, femoral and popliteal veins. COMPARISON:  None. FINDINGS: Contralateral Common Femoral Vein: Respiratory phasicity is normal and symmetric with the symptomatic side. No evidence of thrombus. Normal compressibility. Common Femoral Vein: No evidence of thrombus. Normal compressibility, respiratory phasicity and response to augmentation. Saphenofemoral Junction: No evidence of thrombus. Normal compressibility and flow on color Doppler imaging. Profunda Femoral Vein: No evidence of thrombus. Normal compressibility and flow on color Doppler imaging. Femoral Vein: No evidence of thrombus. Normal compressibility, respiratory phasicity and response to augmentation. Popliteal  Vein: No evidence of thrombus. Normal compressibility, respiratory phasicity and response to augmentation. Calf Veins: No evidence of thrombus. Normal compressibility and flow on color Doppler imaging. Superficial Great Saphenous Vein: No evidence of thrombus. Normal compressibility. Venous Reflux:  None. Other Findings:  None. IMPRESSION: No evidence of deep venous thrombosis. Electronically Signed   By: Katherine Mantle M.D.   On: 02/22/2019 12:11   Dg Chest Port 1 View  Result Date: 02/22/2019 CLINICAL DATA:  Altered mental status EXAM: PORTABLE CHEST 1 VIEW COMPARISON:  02/14/2019 FINDINGS: Interval extubation. Low lung volumes with crowding of the interstitial markings. There is no focal consolidation. There is no pleural effusion or pneumothorax. The heart and mediastinal contours are unremarkable. There is no acute osseous abnormality. IMPRESSION: No acute cardiopulmonary disease. Electronically Signed   By: Elige Ko   On: 02/22/2019 12:50   Dg Hip Unilat With Pelvis 2-3 Views Right  Result Date: 02/22/2019 CLINICAL DATA:  Right hip injury. EXAM: DG HIP (WITH OR WITHOUT PELVIS) 2-3V RIGHT COMPARISON:  None. FINDINGS: There is no evidence of hip fracture or dislocation. There is no evidence of arthropathy or other focal bone abnormality. IMPRESSION: Negative. Electronically Signed   By: Lupita Raider M.D.   On: 02/22/2019 12:48     ASSESSMENT AND PLAN:   Patient is 73 year old recently discharged home brought back  1.  Acute encephalopathy over dementia Likely worsening dementia and delirium Initially thought to be UTI.  Urine cultures negative.  Will stop antibiotics.  2.  Dementia with progression continue Ativan Zyprexa and Depakote  3.  Generalized weakness and deconditioning Family unable to take care of her they would like to place her Social work consultation  4.  Hyperlipidemia continue Lipitor  5.   Lovenox for DVT prophylaxis  All the records are reviewed and  case discussed with Care Management/Social Worker Management plans discussed with  the patient, family and they are in agreement.  CODE STATUS: Full code  TOTAL TIME TAKING CARE OF THIS PATIENT: 35 minutes.   POSSIBLE D/C IN 1-2 DAYS, DEPENDING ON CLINICAL CONDITION.  Leia Alf Cashlynn Yearwood M.D on 02/23/2019 at 2:26 PM  Between 7am to 6pm - Pager - [phone]  After 6pm go to [email or web address] - password [email or web address] [facility]  [facility]  Office  [phone]  CC: Primary care physician; Glean Hess, MD  Note: This dictation was prepared with Dragon dictation along with smaller phrase technology. Any transcriptional errors that result from this process are unintentional.

## 2019-02-23 NOTE — Consult Note (Signed)
Consultation Note Date: 02/23/2019   Patient Name: Deborah [name]  DOB: 08-19-1945  MRN: [id number]  Age / Sex: 73 y.o., female  PCP: Reubin Milan, MD Referring Physician: Milagros Loll, MD  Reason for Consultation: Establishing goals of care and Psychosocial/spiritual support  HPI/Patient Profile: 73 y.o. female  with past medical history of dementia, anxiety and depression, IBS, bipolar, osteoporosis, asthma, arthritis, admitted on 02/22/2019 with acute encephalopathy over dementia, likely worsening dementia and delirium initially thought to be UTI, but urine cultures negative.   Clinical Assessment and Goals of Care: Deborah [name] is lying quietly in bed.  Her eyes are closed and she appears calm and comfortable.  I call her name and touch her face, but she does not open her eyes or try to interact with me in any meaningful way.  She appears acutely/chronically ill and frail, somewhat contracted.  Nursing staff is at bedside, but no family present at this time.  Call to daughter/legal guardian, Marlis Edelson at [phone].  I share with [name] and [name], Rashada's husband, that she has been found negative for UTI by urine culture, her decline is thought to be related to advancing dementia.  [name] and I talked about CODE STATUS.  Deborah [name] is to be allowed a natural death (DNR).  We talked about the concept of "let nature take its course", residential hospice care.  I give an example of this, sharing that it is not illegal, or unethical, but we know that some people have a moral problem with not doing everything.   [name] tells me that Deborah [name]'s first husband, her father, passed away in residential hospice with a [facility] care in May.  She shares that Deborah [name] does not know and they prefer to keep it this way.  [name] talks about Deborah [name]'s worsening functional status.  She states that  [name] and [name] moved in with her in July.  She states that [name] is now battling cancer.  [name] shares that there has been functional decline since July, but in particular since last week.  She tells me, "she almost choked to death".  We talked about this rapid change,  We talked about by mouth intake of food and liquids. I share my concern that Deborah [name] is not eating and drinking enough to sustain her.  We talked about rehospitalization's for dehydration, getting IV fluids to "plump her up" but then returning to nursing home to become dehydrated again.  [name] states that they would not accept a PEG tube.  We talked about how to make choices for loved ones including 1) keeping them at the center of decision-making, not what we want for them 2) are we doing something for her or to her, can we change what is happening (we could change her UTI, cannot change dementia) 3 the person Deborah [name] was 5 years ago, how with that person tell family to care for the person she is now.  We talked about disposition options including trial of placement in  long-term care.  I share that I feel that Deborah [name] will let them know within a week or 2 whether she is going to be able to adjust, eat and drink enough to keep her going.  We also talk about residential Hospice.  [name] tells me that she is not ready for residential hospice at this point.  She shares that her preference would be to have in-home hospice services, see if Deborah [name] can recover better at home.  I encourage family to consider what Deborah [name] would want this time to look like and feel like.  I shared that it is important for them to take time think about, talk about, pray about what is next for her.  Conference with bedside nursing, attending, RN case manager about patient condition, disposition, goals of care.   HCPOA  LEGAL GUARDIAN -Marlis EdelsonMarianne Tucker.  Per case management, guardianship papers are available   SUMMARY OF  RECOMMENDATIONS   Considering at home hospice care versus trial at residential SNF.   Code Status/Advance Care Planning:  DNR  Symptom Management:   Per hospitalist, no additional needs at this time.  Palliative Prophylaxis:   Oral Care and Turn Reposition  Additional Recommendations (Limitations, Scope, Preferences):  treat the treatable but no CPR or intubation, considering Hospice care.   Psycho-social/Spiritual:   Desire for further Chaplaincy support:no  Additional Recommendations: Caregiving  Support/Resources and Education on Hospice  Prognosis:   < 4 weeks or less would not be surprising if family elects comfort care only, based on poor by mouth intake, declining functional status, and frailty.   PPS 6 months ago 60%  PPS 3 months ago 40%   PPS now 20%.   Discharge Planning: to be determined, based on family choice.       Primary Diagnoses: Present on Admission: . UTI (urinary tract infection)   I have reviewed the medical record, interviewed the patient and family, and examined the patient. The following aspects are pertinent.  Past Medical History:  Diagnosis Date  . Anxiety   . Arthritis   . Asthma   . Bacteriuria 07/31/2016  . Bipolar 1 disorder (HCC)   . Bipolar 1 disorder, mixed, severe (HCC) 07/26/2016  . Cystitis   . Dementia (HCC)   . Depression   . Heart murmur   . IBS (irritable bowel syndrome)   . Osteoporosis   . Spastic colon   . Swallowing problem 11/09/2016  . Thrombocytopenia (HCC) 08/03/2016  . Ulcer    Social History   Socioeconomic History  . Marital status: Married    Spouse name: Not on file  . Number of children: Not on file  . Years of education: Not on file  . Highest education level: Not on file  Occupational History  . Not on file  Social Needs  . Financial resource strain: Not on file  . Food insecurity    Worry: Not on file    Inability: Not on file  . Transportation needs    Medical: Not on file     Non-medical: Not on file  Tobacco Use  . Smoking status: Current Every Day Smoker    Packs/day: 1.00    Years: 20.00    Pack years: 20.00    Types: Cigarettes    Start date: 03/31/1977  . Smokeless tobacco: Never Used  Substance and Sexual Activity  . Alcohol use: No    Alcohol/week: 0.0 standard drinks    Comment: Ocassional  . Drug use: No  .  Sexual activity: Not Currently  Lifestyle  . Physical activity    Days per week: Not on file    Minutes per session: Not on file  . Stress: Not on file  Relationships  . Social Musician on phone: Not on file    Gets together: Not on file    Attends religious service: Not on file    Active member of club or organization: Not on file    Attends meetings of clubs or organizations: Not on file    Relationship status: Not on file  Other Topics Concern  . Not on file  Social History Narrative  . Not on file   Family History  Problem Relation Age of Onset  . Heart attack Mother   . Cancer Sister        ovarian  . Cancer Maternal Aunt        breast  . Cancer Maternal Aunt        breast   Scheduled Meds: . atorvastatin  20 mg Oral q1800  . cholecalciferol  1,000 Units Oral Daily  . divalproex  625 mg Oral Q12H  . donepezil  5 mg Oral QHS  . enoxaparin (LOVENOX) injection  40 mg Subcutaneous Q24H  . feeding supplement (ENSURE ENLIVE)  237 mL Oral BID BM  . folic acid  1 mg Oral Daily  . ipratropium-albuterol  3 mL Nebulization Q6H  . [START ON 02/24/2019] multivitamin with minerals  1 tablet Oral Daily  . OLANZapine  10 mg Oral QHS  . [START ON 02/24/2019] OLANZapine  5 mg Oral Daily  . vitamin B-12  1,000 mcg Oral Daily   Continuous Infusions: . sodium chloride 75 mL/hr at 02/23/19 0620   PRN Meds:.acetaminophen **OR** acetaminophen, albuterol, LORazepam, ondansetron **OR** ondansetron (ZOFRAN) IV Medications Prior to Admission:  Prior to Admission medications   Medication Sig Start Date End Date Taking?  Authorizing Provider  albuterol (VENTOLIN HFA) 108 (90 Base) MCG/ACT inhaler Inhale 2 puffs into the lungs every 4 (four) hours as needed for wheezing or shortness of breath.   Yes [provider]  atorvastatin (LIPITOR) 20 MG tablet TAKE 1 TABLET (20 MG TOTAL) BY MOUTH DAILY AT 6 PM. 01/27/19  Yes Reubin Milan, MD  cholecalciferol (VITAMIN D3) 25 MCG (1000 UT) tablet Take 1,000 Units by mouth daily.   Yes [provider]  divalproex (DEPAKOTE SPRINKLE) 125 MG capsule TAKE 5 CAPSULES (625 MG TOTAL) BY MOUTH EVERY 12 (TWELVE) HOURS. 02/17/19  Yes Reubin Milan, MD  donepezil (ARICEPT) 5 MG tablet Take 1 tablet (5 mg total) by mouth at bedtime. 12/29/18  Yes Reubin Milan, MD  folic acid (FOLVITE) 1 MG tablet Take 1 tablet (1 mg total) by mouth daily. 12/29/18  Yes Reubin Milan, MD  ipratropium-albuterol (DUONEB) 0.5-2.5 (3) MG/3ML SOLN Take 3 mLs by nebulization every 6 (six) hours. 02/16/19  Yes Altamese Dilling, MD  LORazepam (ATIVAN) 1 MG tablet Take 1 tablet (1 mg total) by mouth daily as needed for anxiety. 12/29/18  Yes Reubin Milan, MD  OLANZapine (ZYPREXA) 10 MG tablet Take 0.5-1 tablets (5-10 mg total) by mouth at bedtime. Takes 5 mg in the morning and 10 mg at bedtime Patient taking differently: Take 5-10 mg by mouth See admin instructions. Take  tablet (5mg ) by mouth every morning and take 1 tablet (10mg ) by mouth every night 12/29/18  Yes , MD  vitamin B-12 (CYANOCOBALAMIN) 1000 MCG tablet Take  1,000 mcg by mouth daily.   Yes [provider]  aspirin EC 81 MG EC tablet Take 1 tablet (81 mg total) by mouth daily. Patient not taking: Reported on 02/13/2019 10/11/18   Fritzi Mandes, MD   Allergies  Allergen Reactions  . Prednisone Nausea And Vomiting, Nausea Only and Other (See Comments)  . Chlorphen-Pe-Acetaminophen Other (See Comments)    Reaction: unknown   Review of Systems  Unable to perform ROS: Dementia    Physical  Exam Vitals signs and nursing note reviewed.  Constitutional:      General: She is not in acute distress.    Appearance: She is ill-appearing.     Comments: Frail and thin, chronically ill appearing does not respond to voice or touch  Cardiovascular:     Rate and Rhythm: Normal rate.  Pulmonary:     Effort: Pulmonary effort is normal. No respiratory distress.  Abdominal:     General: Abdomen is flat. There is no distension.     Tenderness: There is no guarding.  Musculoskeletal:        General: No swelling.  Skin:    General: Skin is warm and dry.  Neurological:     Comments: Does not respond to voice or touch     Vital Signs: BP (!) 155/82   Pulse 86   Temp 98.9 F (37.2 C) (Oral)   Resp 16   Ht 5\' 2"  (1.575 m)   Wt 54.4 kg   SpO2 98%   BMI 21.95 kg/m  Pain Scale: 0-10   Pain Score: 0-No pain   SpO2: SpO2: 98 % O2 Device:SpO2: 98 % O2 Flow Rate: .   IO: Intake/output summary:   Intake/Output Summary (Last 24 hours) at 02/23/2019 1506 Last data filed at 02/23/2019 0935 Gross per 24 hour  Intake 1000.27 ml  Output 200 ml  Net 800.27 ml    LBM: Last BM Date: 02/22/19 Baseline Weight: Weight: 54.4 kg Most recent weight: Weight: 54.4 kg     Palliative Assessment/Data:   Flowsheet Rows     Most Recent Value  Intake Tab  Referral Department  Hospitalist  Unit at Time of Referral  Med/Surg Unit  Palliative Care Primary Diagnosis  Neurology  Date Notified  02/23/19  Palliative Care Type  Return patient Palliative Care  Reason for referral  Clarify Goals of Care  Date of Admission  02/22/19  Date first seen by Palliative Care  02/23/19  # of days Palliative referral response time  0 Day(s)  # of days IP prior to Palliative referral  1  Clinical Assessment  Palliative Performance Scale Score  30%  Pain Max last 24 hours  Not able to report  Pain Min Last 24 hours  Not able to report  Dyspnea Max Last 24 Hours  Not able to report  Dyspnea Min Last 24  hours  Not able to report  Psychosocial & Spiritual Assessment  Palliative Care Outcomes     Time In: 1420 Time Out: 1535 Time Total: 75 minutes  Greater than 50%  of this time was spent counseling and coordinating care related to the above assessment and plan.  Signed by: Drue Novel, NP   Please contact Palliative Medicine Team phone at [phone] for questions and concerns.  For individual provider: See Shea Evans

## 2019-02-23 NOTE — Progress Notes (Signed)
Initial Nutrition Assessment  DOCUMENTATION CODES:   Not applicable  INTERVENTION:   Ensure Enlive po BID, each supplement provides 350 kcal and 20 grams of protein  Magic cup TID with meals, each supplement provides 290 kcal and 9 grams of protein  MVI daily   Dysphagia 3 diet   NUTRITION DIAGNOSIS:   Inadequate oral intake related to acute illness as evidenced by meal completion < 50%.  GOAL:   Patient will meet greater than or equal to 90% of their needs  MONITOR:   PO intake, Supplement acceptance, Labs, Weight trends, Skin, I & O's  REASON FOR ASSESSMENT:   Malnutrition Screening Tool    ASSESSMENT:   73 y.o. female with a known history of dementia, arthritis, asthma, bipolar 1 disorder, depression, recent admission for aspiration and aspiration pneumonia returns with weakness and found to have UTI  RD working remotely.  Unable to speak with pt r/t dementia. Pt familiar to nutrition department from recent previous admit. Pt with poor appetite and oral intake at baseline r/t dementia. Pt eating <50% of meals during her last admit; pt eating only 35% of meals today. RD will add supplements and MVI to help pt meet her estimated needs. Per chart, pt appears fairly weight stable at baseline.   Medications reviewed and include: D3, lovenox, folic acid, V56, NaCl @75ml /hr, ceftriaxone   Labs reviewed: wbc- 11.1(H)  Unable to complete Nutrition-Focused physical exam at this time.   Diet Order:   Diet Order            DIET DYS 3 Room service appropriate? No; Fluid consistency: Thin  Diet effective now             EDUCATION NEEDS:   Not appropriate for education at this time  Skin:  Skin Assessment: Reviewed RN Assessment(Ecchymosis, Stage I buttocks)  Last BM:  10/8  Height:   Ht Readings from Last 1 Encounters:  02/22/19 5\' 2"  (1.575 m)    Weight:   Wt Readings from Last 1 Encounters:  02/22/19 54.4 kg    Ideal Body Weight:  50 kg  BMI:  Body  mass index is 21.95 kg/m.  Estimated Nutritional Needs:   Kcal:  1300-1500kcal/day  Protein:  70-80g/day  Fluid:  >1.4L/day  Koleen Distance MS, RD, LDN Pager #- [phone] Office#- [phone] After Hours Pager: [phone]

## 2019-02-24 MED ORDER — LORAZEPAM 1 MG PO TABS: 1.0000 mg | ORAL_TABLET | Freq: Four times a day (QID) | ORAL | 0 refills | Status: DC | PRN

## 2019-02-24 MED ORDER — MORPHINE SULFATE (CONCENTRATE) 10 MG /0.5 ML PO SOLN: 10.0000 mg | Freq: Four times a day (QID) | ORAL | 0 refills | Status: DC | PRN

## 2019-02-24 MED ORDER — IPRATROPIUM-ALBUTEROL 0.5-2.5 (3) MG/3ML IN SOLN
3.0000 mL | Freq: Three times a day (TID) | RESPIRATORY_TRACT | Status: DC
  Administered 2019-02-24: 09:00:00 3 mL via RESPIRATORY_TRACT
  Filled 2019-02-24 (×2): qty 3

## 2019-02-24 NOTE — Progress Notes (Signed)
Patient discharged home with daughter Hennie Duos.

## 2019-02-24 NOTE — TOC Transition Note (Signed)
Transition of Care [facility]) - CM/SW Discharge Note   Patient Details  Name: Deborah [name] MRN: [id number] Date of Birth: Jun 15, 1945  [facility] CM/SW Contact:  Weston Anna, LCSW Phone Number: 02/24/2019, 1:27 PM   Clinical Narrative:     Patient set to discharge home with hospice services- referral completed for [facility]. Family wanting to take patient home asap- [facility] is able to follow up with family once at home to determine any needs.   No other concerns at this time   Final next level of care: Home w Hospice Care     Patient Goals and CMS Choice        Discharge Placement                       Discharge Plan and Services   Discharge Planning Services: CM Consult                                 Social Determinants of Health (SDOH) Interventions     Readmission Risk Interventions Readmission Risk Prevention Plan 02/23/2019 02/15/2019  Transportation Screening Complete Complete  Palliative Care Screening Complete -  Medication Review (RN Care Manager) Complete Complete  Some recent data might be hidden

## 2019-02-24 NOTE — Progress Notes (Signed)
[facility])  Referral received for hospice services at home once discharged.  Information sent to [facility] for follow up with ordering DME.  Provided RN with SW covering [facility] phone #.  Any follow up concerns, please call [phone].  Venia Carbon RN, BSN, [facility] (in [place]) [phone]

## 2019-02-24 NOTE — Discharge Instructions (Signed)
Diet as tolerated

## 2019-02-24 NOTE — Progress Notes (Signed)
   Advance care planning  Purpose of Encounter Worsening dementia  Parties in Attendance Patient, daughter Latanya Presser who is the healthcare power of attorney at bedside  Patients Decisional capacity Advanced dementia.  Nonverbal at this point.  Unable to make medical decisions.  Daughter at bedside makes decisions.  Discussed in detail regarding worsening dementia.  Treatment plan , prognosis discussed.  All questions answered  Discussed regarding CODE STATUS.  Patient is DNR/DNI.  With worsening dementia, poor oral intake patient qualifies for hospice and daughter is requesting discharge home with hospice.  Discussed regarding equipment and daughter thinks a hospital bed with left would be more hassle.  Wants to take patient home.  Has wheelchair and help at home.  Hospice services being set up by social work.  Rescinded and CODE STATUS changed  DNR/DNI  Time spent - 17 minutes

## 2019-02-26 ENCOUNTER — Telehealth: Payer: Self-pay

## 2019-02-26 NOTE — Telephone Encounter (Signed)
I will not serve as hospice attending.  I will sign orders for hospice.

## 2019-02-26 NOTE — Telephone Encounter (Signed)
Christy from Hospice called as patient is being D/C from [facility] today and family requests in home Hospice Care. Need PCP to sign off for orders and would like to know if PCP will service as  Hospice Attending?

## 2019-02-27 ENCOUNTER — Telehealth: Payer: Self-pay

## 2019-02-27 NOTE — Telephone Encounter (Signed)
Christy from [facility] called asking if Dr. Army Melia will sign hospice orders for mutual pt and also be the hospice attending.  Called back and left Vm for Christy letting her know Dr. Army Melia will sign hospice orders but will not be the attending. Told her to call the office back if she has any further questions.  CB# [phone]

## 2019-02-28 ENCOUNTER — Other Ambulatory Visit: Payer: Self-pay | Admitting: Internal Medicine

## 2019-02-28 ENCOUNTER — Telehealth: Payer: Self-pay | Admitting: Internal Medicine

## 2019-02-28 DIAGNOSIS — F319 Bipolar disorder, unspecified: Secondary | ICD-10-CM

## 2019-02-28 NOTE — Telephone Encounter (Signed)
They just called yesterday about this and have not sent [place] the paperwork yet from Hospice. Once we get the papers, we will sign off and send in. The papers should come today. Thank you.

## 2019-02-28 NOTE — Telephone Encounter (Signed)
Daughter is  waiting for paperwork for Hospice Palliative care.

## 2019-02-28 NOTE — Telephone Encounter (Signed)
°  Called to schedule Medicare Annual Wellness Visit with Nurse Health Advisor, Kasey Uthus. If patient returns call, please schedule AWV with [facility] °Questions regarding scheduling, please call  [phone] or Skype > [email or web address]  ° °Kathryn Brown  °Care Guide • [facility] °[place]   [facility]  °??[email or web address]   ??[phone]   1Skype ° ° ° ° ° ° ° ° ° °

## 2019-03-02 NOTE — Discharge Summary (Signed)
[facility] [place] NAME: Deborah [name]    MR#:  [id number]  DATE OF BIRTH:  [birth year]  DATE OF ADMISSION:  02/22/2019 ADMITTING PHYSICIAN: Dustin Flock, MD  DATE OF DISCHARGE: 02/24/2019  3:03 PM  PRIMARY CARE PHYSICIAN: Glean Hess, MD   ADMISSION DIAGNOSIS:  Right leg swelling [M79.89]  DISCHARGE DIAGNOSIS:  Active Problems:   UTI (urinary tract infection)   Failure to thrive in adult   Encounter for hospice care discussion   DNR (do not resuscitate) discussion   SECONDARY DIAGNOSIS:   Past Medical History:  Diagnosis Date  . Anxiety   . Arthritis   . Asthma   . Bacteriuria 07/31/2016  . Bipolar 1 disorder ([place])   . Bipolar 1 disorder, mixed, severe ([place]) 07/26/2016  . Cystitis   . Dementia ([place])   . Depression   . Heart murmur   . IBS (irritable bowel syndrome)   . Osteoporosis   . Spastic colon   . Swallowing problem 11/09/2016  . Thrombocytopenia ([place]) 08/03/2016  . Ulcer      ADMITTING HISTORY  HISTORY OF PRESENT ILLNESS: Deborah [name]  is a 73 y.o. female with a known history of dementia, arthritis, asthma, bipolar 1 disorder, depression, recent admission for aspiration and aspiration pneumonia.  Who is discharged last week brought back by family due to patient not able to get out of bed.  She is not also communicating since last week.  According to the family patient is noted to have right lower extremity swelling and whenever they try to move her patient seems to be in pain.  HOSPITAL COURSE:   Patient is 73 year old recently discharged home brought back  1.Acute encephalopathy over dementia 2.Dementia with progression 3.Generalized weakness and deconditioning 4.Hyperlipidemia  He was initially started on IV antibiotics for possible UTI.  Urine cultures returned negative.  Antibiotics were stopped.  Palliative care discussed with family and daughter wanted to take patient home with hospice  services.  She requested we stop most of her medications other than for pain and anxiety. Hospice services set up at home and discharged home.  CONSULTS OBTAINED:  Treatment Team:  Hillary Bow, MD  DRUG ALLERGIES:   Allergies  Allergen Reactions  . Prednisone Nausea And Vomiting, Nausea Only and Other (See Comments)  . Chlorphen-Pe-Acetaminophen Other (See Comments)    Reaction: unknown    DISCHARGE MEDICATIONS:   Allergies as of 02/24/2019      Reactions   Prednisone Nausea And Vomiting, Nausea Only, Other (See Comments)   Chlorphen-pe-acetaminophen Other (See Comments)   Reaction: unknown      Medication List    STOP taking these medications   aspirin 81 MG EC tablet   atorvastatin 20 MG tablet Commonly known as: LIPITOR   cholecalciferol 25 MCG (1000 UT) tablet Commonly known as: VITAMIN D3   divalproex 125 MG capsule Commonly known as: DEPAKOTE SPRINKLE   folic acid 1 MG tablet Commonly known as: FOLVITE   vitamin B-12 1000 MCG tablet Commonly known as: CYANOCOBALAMIN     TAKE these medications   albuterol 108 (90 Base) MCG/ACT inhaler Commonly known as: VENTOLIN HFA Inhale 2 puffs into the lungs every 4 (four) hours as needed for wheezing or shortness of breath.   donepezil 5 MG tablet Commonly known as: ARICEPT Take 1 tablet (5 mg total) by mouth at bedtime.   ipratropium-albuterol 0.5-2.5 (3) MG/3ML Soln Commonly known as: DUONEB Take 3 mLs by nebulization every  6 (six) hours.   LORazepam 1 MG tablet Commonly known as: ATIVAN Take 1 tablet (1 mg total) by mouth every 6 (six) hours as needed for anxiety. What changed: when to take this   morphine CONCENTRATE 10 mg / 0.5 ml concentrated solution Take 0.5 mLs (10 mg total) by mouth every 6 (six) hours as needed for severe pain or shortness of breath.       Today   VITAL SIGNS:  Blood pressure 138/61, pulse 92, temperature 99.6 F (37.6 C), temperature source Oral, resp. rate 15, height  5\' 2"  (1.575 m), weight 54.4 kg, SpO2 97 %.  I/O:  No intake or output data in the 24 hours ending 03/02/19 1412  PHYSICAL EXAMINATION:  Physical Exam  GENERAL:  73 y.o.-year-old patient lying in the bed with no acute distress.  LUNGS: Normal breath sounds bilaterally CARDIOVASCULAR: S1, S2  ABDOMEN: Soft, non-tender NEUROLOGIC: Not following instructions PSYCHIATRIC: The patient is confused  DATA REVIEW:   CBC No results for input(s): WBC, HGB, HCT, PLT in the last 168 hours.  Chemistries  No results for input(s): NA, K, CL, CO2, GLUCOSE, BUN, CREATININE, CALCIUM, MG, AST, ALT, ALKPHOS, BILITOT in the last 168 hours.  Invalid input(s): GFRCGP  Cardiac Enzymes No results for input(s): TROPONINI in the last 168 hours.  Microbiology Results  Results for orders placed or performed during the hospital encounter of 02/22/19  Urine culture     Status: None   Collection Time: 02/22/19 11:25 AM   Specimen: Urine, Clean Catch  Result Value Ref Range Status   Specimen Description   Final    URINE, CLEAN CATCH Performed at [facility], [address], [place], [place]    Special Requests   Final    NONE Performed at [facility], [address] [place], [place]    Culture   Final    NO GROWTH Performed at [facility], [address] [place], [place]    Report Status 02/23/2019 FINAL  Final  SARS CORONAVIRUS 2 (TAT 6-24 HRS) Nasopharyngeal Nasopharyngeal Swab     Status: None   Collection Time: 02/22/19  1:51 PM   Specimen: Nasopharyngeal Swab  Result Value Ref Range Status   SARS Coronavirus 2 NEGATIVE NEGATIVE Final    Comment: (NOTE) SARS-CoV-2 target nucleic acids are NOT DETECTED. The SARS-CoV-2 RNA is generally detectable in upper and lower respiratory specimens during the acute phase of infection. Negative results do not preclude SARS-CoV-2 infection, do not rule out co-infections with other pathogens, and  should not be used as the sole basis for treatment or other patient management decisions. Negative results must be combined with clinical observations, patient history, and epidemiological information. The expected result is Negative. Fact Sheet for Patients: 04/24/19 Fact Sheet for Healthcare Providers: HairSlick.no This test is not yet approved or cleared by the quierodirigir.com FDA and  has been authorized for detection and/or diagnosis of SARS-CoV-2 by FDA under an Emergency Use Authorization (EUA). This EUA will remain  in effect (meaning this test can be used) for the duration of the COVID-19 declaration under Section 56 4(b)(1) of the Act, 21 U.S.C. section 360bbb-3(b)(1), unless the authorization is terminated or revoked sooner. Performed at [facility], [address], [place], [place]     RADIOLOGY:  No results found.  Follow up with PCP in 1 week.  Management plans discussed with the patient, family and they are in agreement.  CODE STATUS:  Code Status History    Date Active Date Inactive Code Status Order ID Comments User Context   02/23/2019 1525 02/24/2019 1933 DNR [id number]  Katheran Aweove, Tasha A, NP Inpatient   02/22/2019 1803 02/23/2019 1525 Full Code [id number]  Auburn BilberryPatel, Shreyang, MD Inpatient   02/22/2019 1450 02/22/2019 1803 DNR [id number]  Auburn BilberryPatel, Shreyang, MD ED   02/14/2019 1227 02/16/2019 2112 DNR [id number]  Erin FullingKasa, Kurian, MD Inpatient   02/13/2019 1617 02/14/2019 1227 Full Code [id number]  Ramonita LabGouru, Aruna, MD ED   10/06/2018 1237 10/10/2018 2102 DNR [id number]  Joylene DraftShaffer, Shae Lee N, NP Inpatient   10/01/2018 1702 10/06/2018 1237 Partial Code [id number]  Mayo, Allyn KennerKaty Dodd, MD ED   09/25/2015 2034 09/26/2015 1728 Full Code [id number]  Clapacs, Jackquline DenmarkJohn T, MD Inpatient   Advance Care Planning Activity    Questions for Most Recent Historical Code Status (Order [id number])    Question Answer Comment   In the event of  cardiac or respiratory ARREST Do not call a "code blue"    In the event of cardiac or respiratory ARREST Do not perform Intubation, CPR, defibrillation or ACLS    In the event of cardiac or respiratory ARREST Use medication by any route, position, wound care, and other measures to relive pain and suffering. May use oxygen, suction and manual treatment of airway obstruction as needed for comfort.       TOTAL TIME TAKING CARE OF THIS PATIENT ON DAY OF DISCHARGE: more than 30 minutes.   Molinda BailiffSrikar R Osamah Schmader M.D on 03/02/2019 at 2:12 PM  Between 7am to 6pm - Pager - [phone]  After 6pm go to [email or web address] - password [email or web address] [facility]  [facility]  Office  [phone]  CC: Primary care physician; Reubin MilanBerglund, Laura H, MD  Note: This dictation was prepared with Dragon dictation along with smaller phrase technology. Any transcriptional errors that result from this process are unintentional.

## 2019-03-06 ENCOUNTER — Ambulatory Visit: Payer: Medicare HMO | Admitting: Licensed Clinical Social Worker

## 2019-03-06 DIAGNOSIS — F319 Bipolar disorder, unspecified: Secondary | ICD-10-CM

## 2019-03-06 DIAGNOSIS — F0391 Unspecified dementia with behavioral disturbance: Secondary | ICD-10-CM

## 2019-03-06 DIAGNOSIS — J449 Chronic obstructive pulmonary disease, unspecified: Secondary | ICD-10-CM

## 2019-03-06 NOTE — Chronic Care Management (AMB) (Signed)
  Chronic Care Management    Clinical Social Work Follow Up Note  03/06/2019 Name: Deborah [name] MRN: [id number] DOB: Oct 31, 1945  Deborah [name] is a 73 y.o. year old female who is a primary care patient of Army Melia Jesse Sans, MD. The CCM team was consulted for assistance with Level of Care Concerns.   Review of patient status, including review of consultants reports, other relevant assessments, and collaboration with appropriate care team members and the patient's provider was performed as part of comprehensive patient evaluation and provision of chronic care management services.    SDOH (Social Determinants of Health) screening performed today: Stress. See Care Plan for related entries.   Advanced Directives Status: <no information> See Care Plan for related entries.   Outpatient Encounter Medications as of 03/06/2019  Medication Sig  . albuterol (VENTOLIN HFA) 108 (90 Base) MCG/ACT inhaler Inhale 2 puffs into the lungs every 4 (four) hours as needed for wheezing or shortness of breath.  . donepezil (ARICEPT) 5 MG tablet Take 1 tablet (5 mg total) by mouth at bedtime.  Marland Kitchen ipratropium-albuterol (DUONEB) 0.5-2.5 (3) MG/3ML SOLN Take 3 mLs by nebulization every 6 (six) hours.  Marland Kitchen LORazepam (ATIVAN) 1 MG tablet Take 1 tablet (1 mg total) by mouth every 6 (six) hours as needed for anxiety.  . Morphine Sulfate (MORPHINE CONCENTRATE) 10 mg / 0.5 ml concentrated solution Take 0.5 mLs (10 mg total) by mouth every 6 (six) hours as needed for severe pain or shortness of breath.  . OLANZapine (ZYPREXA) 10 MG tablet Take 1.5 tablets (15 mg total) by mouth daily. 5 mg in AM and 10 mg in PM   No facility-administered encounter medications on file as of 03/06/2019.      Goals Addressed    . SW- "We want Aivah to stay home with Hospice." (pt-stated)       Current Barriers:  . Limited social support . Level of care concerns . ADL IADL limitations . Social Isolation . Limited access to caregiver .  Memory Deficits  Clinical Social Work Clinical Goal(s):  Marland Kitchen Over the next 120 days, patient will work with SW to address concerns related to providing in home support to pt to relieve family of caregiver burnout   Interventions: . Patient interviewed and appropriate assessments performed . Provided patient with information about LTC placement in case family changes their mind and wishes to pursue that route instead of Home Hospice . Discussed plans with patient for ongoing care management follow up and provided patient with direct contact information for care management team . Assisted patient/caregiver with obtaining information about health plan benefits . Provided education and assistance to client regarding Advanced Directives. . Provided education to patient/caregiver regarding level of care options. . Provided education to patient/caregiver about Hospice and/or Palliative Care services . Family report that Hospice just left after completing their initial assessment. Pt with have a Hospice SW. Family is eager for Hospice in the Home sevices to start.   Patient Self Care Activities:  . Attends all scheduled provider appointments . Unable to perform ADLs independently  Initial goal documentation     Follow Up Plan: SW will follow up with patient by phone over the next quarter  Eula Fried, [place], MSW, [email or web address] Phone: [phone]

## 2019-03-08 ENCOUNTER — Ambulatory Visit: Payer: Self-pay | Admitting: *Deleted

## 2019-03-08 DIAGNOSIS — F02818 Dementia in other diseases classified elsewhere, unspecified severity, with other behavioral disturbance: Secondary | ICD-10-CM

## 2019-03-08 DIAGNOSIS — F0281 Dementia in other diseases classified elsewhere with behavioral disturbance: Secondary | ICD-10-CM

## 2019-03-08 NOTE — Chronic Care Management (AMB) (Signed)
  Chronic Care Management   Follow Up Note   03/08/2019 Name: ANNASTON UPHAM MRN: [id number] DOB: Jun 14, 1945  Referred by: Glean Hess, MD Reason for referral : Care Coordination (CCM Team collaboration)   IYLAH DWORKIN is a 73 y.o. year old female who is a primary care patient of Army Melia Jesse Sans, MD. The CCM team was consulted for assistance with chronic disease management and care coordination needs.    Review of patient status, including review of consultants reports, relevant laboratory and other test results, and collaboration with appropriate care team members and the patient's provider was performed as part of comprehensive patient evaluation and provision of chronic care management services.    SDOH (Social Determinants of Health) screening performed today: None. See Care Plan for related entries.   Advanced Directives Status: N See Care Plan and Vynca application for related entries.  Outpatient Encounter Medications as of 03/08/2019  Medication Sig  . albuterol (VENTOLIN HFA) 108 (90 Base) MCG/ACT inhaler Inhale 2 puffs into the lungs every 4 (four) hours as needed for wheezing or shortness of breath.  . donepezil (ARICEPT) 5 MG tablet Take 1 tablet (5 mg total) by mouth at bedtime.  Marland Kitchen ipratropium-albuterol (DUONEB) 0.5-2.5 (3) MG/3ML SOLN Take 3 mLs by nebulization every 6 (six) hours.  Marland Kitchen LORazepam (ATIVAN) 1 MG tablet Take 1 tablet (1 mg total) by mouth every 6 (six) hours as needed for anxiety.  . Morphine Sulfate (MORPHINE CONCENTRATE) 10 mg / 0.5 ml concentrated solution Take 0.5 mLs (10 mg total) by mouth every 6 (six) hours as needed for severe pain or shortness of breath.  . OLANZapine (ZYPREXA) 10 MG tablet Take 1.5 tablets (15 mg total) by mouth daily. 5 mg in AM and 10 mg in PM   No facility-administered encounter medications on file as of 03/08/2019.      Goals Addressed            This Visit's Progress   . COMPLETED: LTC placement goals  (pt-stated)       Current Barriers:  . Cognitive Deficits  Nurse Case Manager Clinical Goal(s):  Marland Kitchen Over the next 30 days, patient will meet with RN Care Manager to address family/ caregiver  needs   Interventions:  . Collaborated with CCM LCSW regarding patient's current status of Home Hospice care.    . No needs further RNCM needs   Patient Self Care Activities:  . Have not assessed  . Have not assessed. EMR reveals this patient is unable to to do ADLs or IADLS  Please see past updates related to this goal by clicking on the "Past Updates" button in the selected goal          No further follow up required: Patient admitted to [place], BSN Nurse Case Manager [facility]  [phone]) Business Mobile

## 2019-03-25 DIAGNOSIS — J449 Chronic obstructive pulmonary disease, unspecified: Secondary | ICD-10-CM | POA: Diagnosis not present

## 2019-04-03 ENCOUNTER — Ambulatory Visit: Payer: Medicare HMO | Admitting: Licensed Clinical Social Worker

## 2019-04-03 DIAGNOSIS — F0391 Unspecified dementia with behavioral disturbance: Secondary | ICD-10-CM

## 2019-04-03 NOTE — Chronic Care Management (AMB) (Signed)
Chronic Care Management    Clinical Social Work Follow Up Note  04/03/2019 Name: Deborah [name] MRN: [id number] DOB: 1945-06-05  Deborah [name] is a 73 y.o. year old female who is a primary care patient of Deborah Graves Nyoka Cowden, MD. The CCM team was consulted for assistance with Caregiver Stress.   Review of patient status, including review of consultants reports, other relevant assessments, and collaboration with appropriate care team members and the patient's provider was performed as part of comprehensive patient evaluation and provision of chronic care management services.    SDOH (Social Determinants of Health) screening performed today: Stress. See Care Plan for related entries.   Advanced Directives Status: <no information> See Care Plan for related entries.   Outpatient Encounter Medications as of 04/03/2019  Medication Sig  . albuterol (VENTOLIN HFA) 108 (90 Base) MCG/ACT inhaler Inhale 2 puffs into the lungs every 4 (four) hours as needed for wheezing or shortness of breath.  . donepezil (ARICEPT) 5 MG tablet Take 1 tablet (5 mg total) by mouth at bedtime.  Marland Kitchen ipratropium-albuterol (DUONEB) 0.5-2.5 (3) MG/3ML SOLN Take 3 mLs by nebulization every 6 (six) hours.  Marland Kitchen LORazepam (ATIVAN) 1 MG tablet Take 1 tablet (1 mg total) by mouth every 6 (six) hours as needed for anxiety.  . Morphine Sulfate (MORPHINE CONCENTRATE) 10 mg / 0.5 ml concentrated solution Take 0.5 mLs (10 mg total) by mouth every 6 (six) hours as needed for severe pain or shortness of breath.  . OLANZapine (ZYPREXA) 10 MG tablet Take 1.5 tablets (15 mg total) by mouth daily. 5 mg in AM and 10 mg in PM   No facility-administered encounter medications on file as of 04/03/2019.      Goals Addressed    . SW- "We want [name] to stay home with Hospice." (pt-stated)       Current Barriers:  . Limited social support . Level of care concerns . ADL IADL limitations . Social Isolation . Limited access to caregiver . Memory  Deficits  Clinical Social Work Clinical Goal(s):  Marland Kitchen Over the next 120 days, patient will work with SW to address concerns related to providing in home support to pt to relieve family of caregiver burnout   Interventions: . Patient interviewed and appropriate assessments performed . Provided patient with information about LTC placement in case family changes their mind and wishes to pursue that route instead of Home Hospice . Discussed plans with patient for ongoing care management follow up and provided patient with direct contact information for care management team . Assisted patient/caregiver with obtaining information about health plan benefits . Provided education and assistance to client regarding Advanced Directives. . Provided education to patient/caregiver regarding level of care options. . Provided education to patient/caregiver about Hospice and/or Palliative Care services . Daughter admits that she is still experiencing caregiver burnout even with Hospice involved. She reports the only thing Hospice provides is getting her a bath 3x per week which has been very helpful but the family was wondering if there were any other services that Hospice or we could provide to help? She reports that patient is now at 97 lbs and not eating as much. Patient is still drinking 1 ensure per day though. Daughter continues to work full time and her her step dad with patient during the day but the step dad is not able to help with personal care.  Marland Kitchen LCSW will update PCP and CCM RNCM and investigate resources  Patient Self Care Activities:  .  Attends all scheduled provider appointments . Unable to perform ADLs independently  Please see past updates related to this goal by clicking on the "Past Updates" button in the selected goal      Follow Up Plan: SW will follow up with patient by phone over the next quarter  Deborah [name], [place], MSW, [email or web address] Phone: [phone]

## 2019-04-24 DIAGNOSIS — J449 Chronic obstructive pulmonary disease, unspecified: Secondary | ICD-10-CM | POA: Diagnosis not present

## 2019-05-22 ENCOUNTER — Telehealth: Payer: Self-pay

## 2019-05-25 DIAGNOSIS — J449 Chronic obstructive pulmonary disease, unspecified: Secondary | ICD-10-CM | POA: Diagnosis not present

## 2019-06-25 DIAGNOSIS — J449 Chronic obstructive pulmonary disease, unspecified: Secondary | ICD-10-CM | POA: Diagnosis not present

## 2019-07-23 DIAGNOSIS — J449 Chronic obstructive pulmonary disease, unspecified: Secondary | ICD-10-CM | POA: Diagnosis not present

## 2019-08-23 DIAGNOSIS — J449 Chronic obstructive pulmonary disease, unspecified: Secondary | ICD-10-CM | POA: Diagnosis not present

## 2019-09-22 DIAGNOSIS — J449 Chronic obstructive pulmonary disease, unspecified: Secondary | ICD-10-CM | POA: Diagnosis not present

## 2019-10-23 DIAGNOSIS — J449 Chronic obstructive pulmonary disease, unspecified: Secondary | ICD-10-CM | POA: Diagnosis not present

## 2019-11-22 DIAGNOSIS — J449 Chronic obstructive pulmonary disease, unspecified: Secondary | ICD-10-CM | POA: Diagnosis not present

## 2019-12-23 DIAGNOSIS — J449 Chronic obstructive pulmonary disease, unspecified: Secondary | ICD-10-CM | POA: Diagnosis not present

## 2020-01-23 DIAGNOSIS — J449 Chronic obstructive pulmonary disease, unspecified: Secondary | ICD-10-CM | POA: Diagnosis not present

## 2020-02-22 DIAGNOSIS — J449 Chronic obstructive pulmonary disease, unspecified: Secondary | ICD-10-CM | POA: Diagnosis not present

## 2020-07-29 IMAGING — US US EXTREM LOW VENOUS*R*
1 series · 13 of 24 positions shown · non-contrast
Comparison: None.

CLINICAL DATA: Leg swelling



[Series 1: us extrem low venous*right* · 13 of 34 slices shown]
[im 1/34]
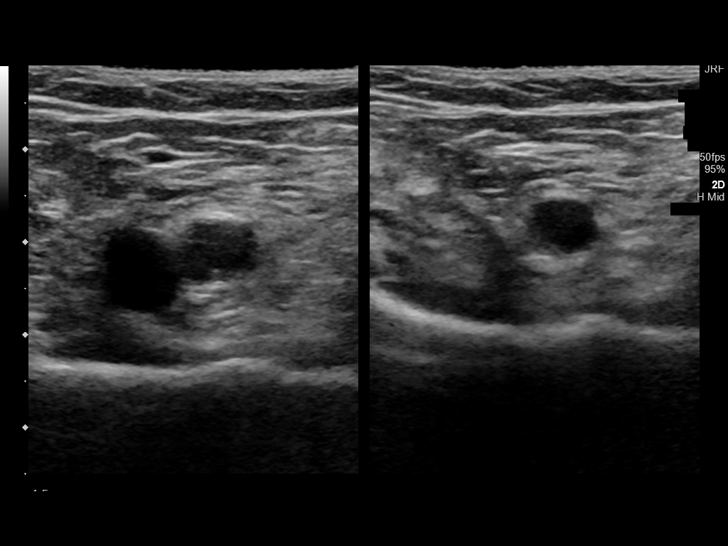
[im 3/34]
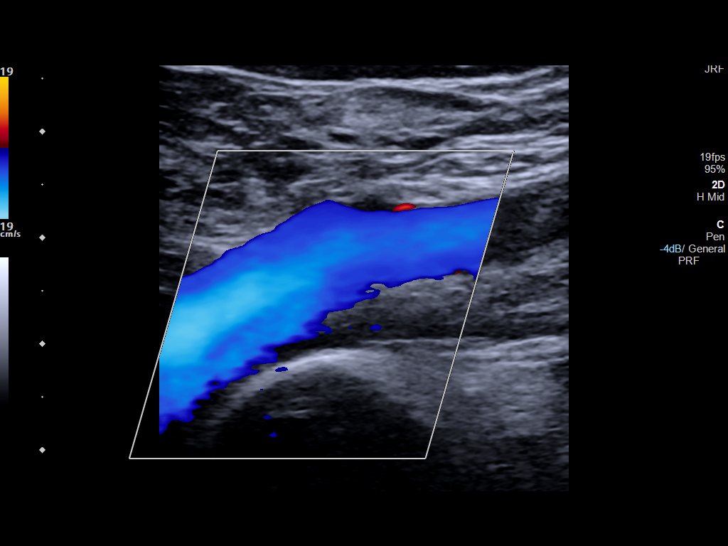
[im 6/34]
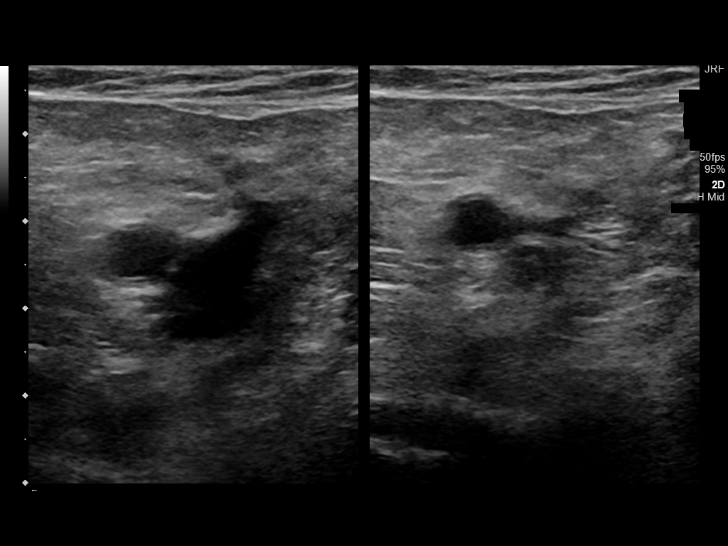
[im 9/34]
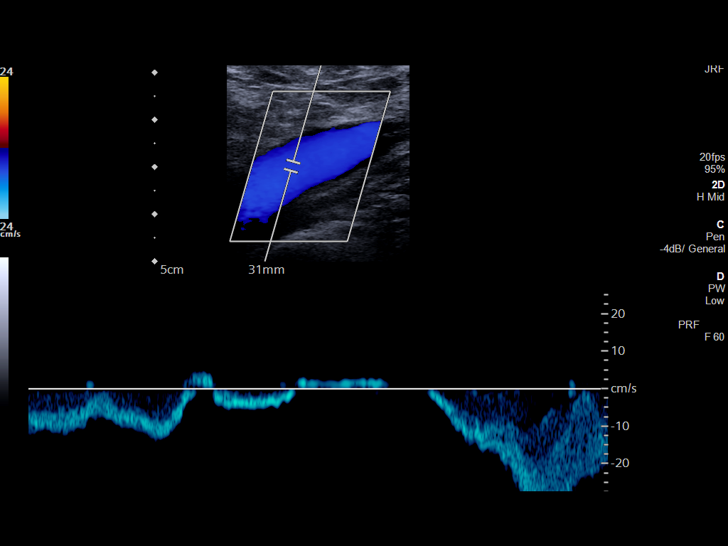
[im 12/34]
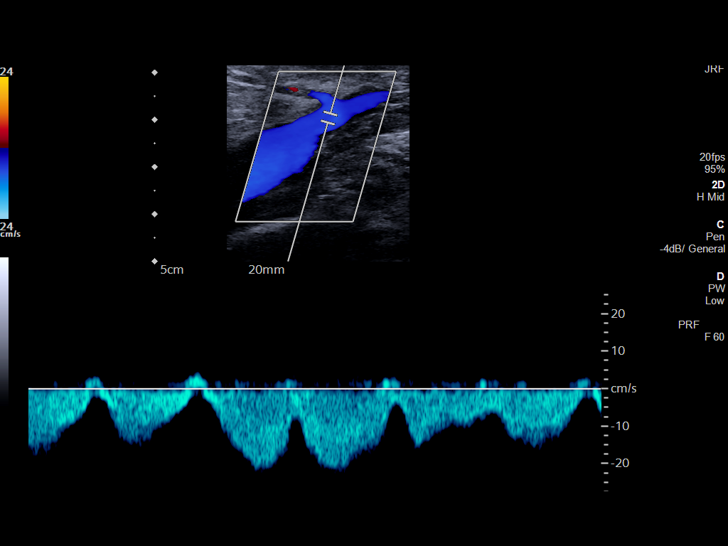
[im 15/34]
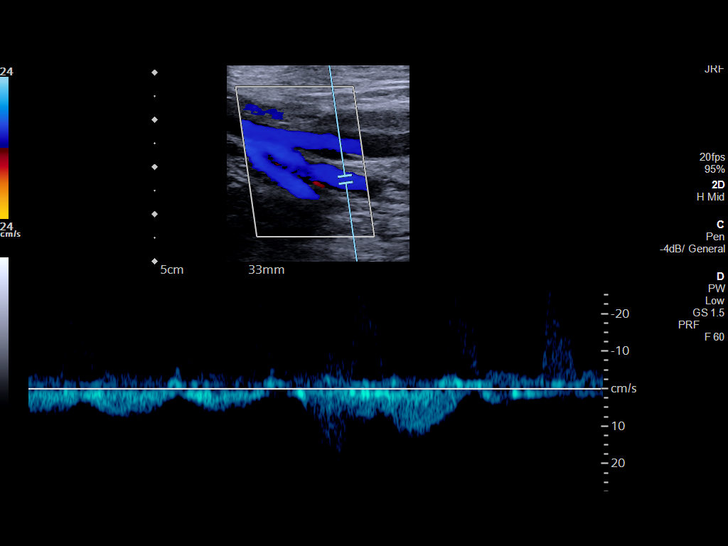
[im 18/34]
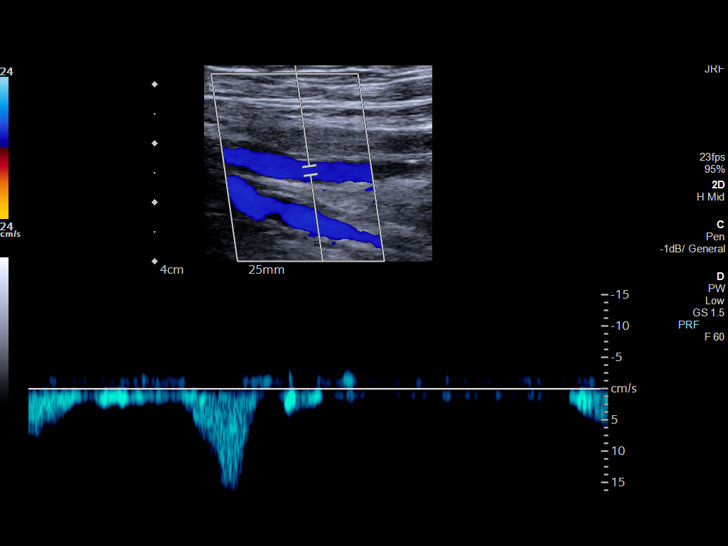
[im 19/34]
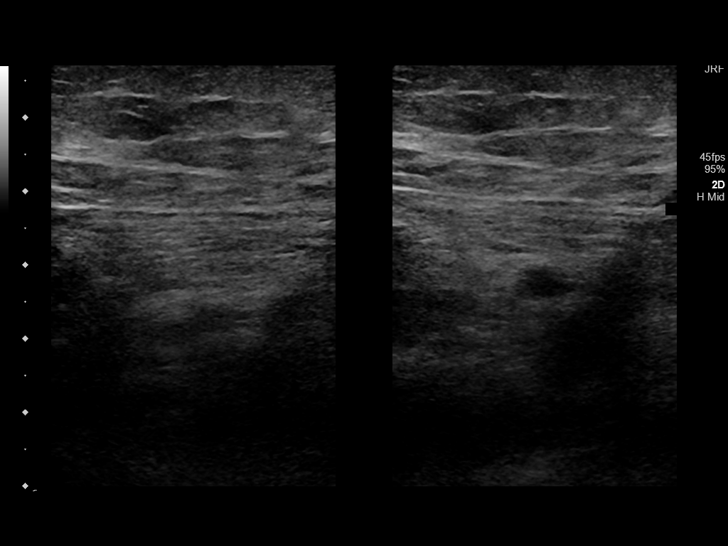
[im 22/34]
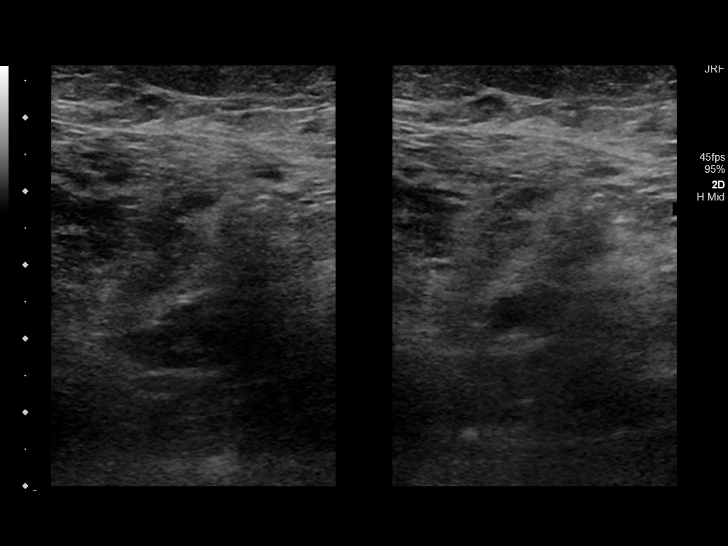
[im 25/34]
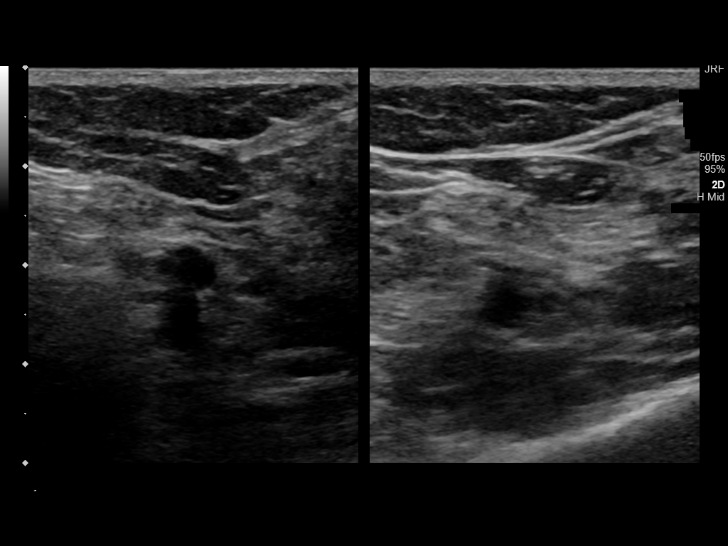
[im 28/34]
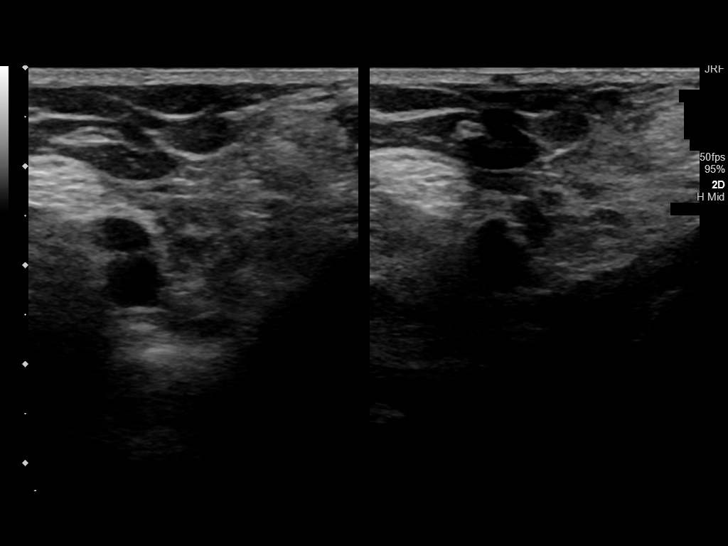
[im 31/34]
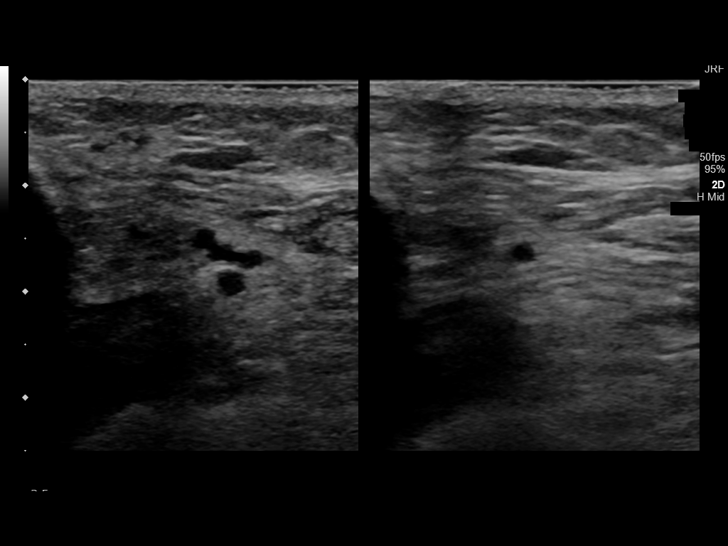
[im 34/34]
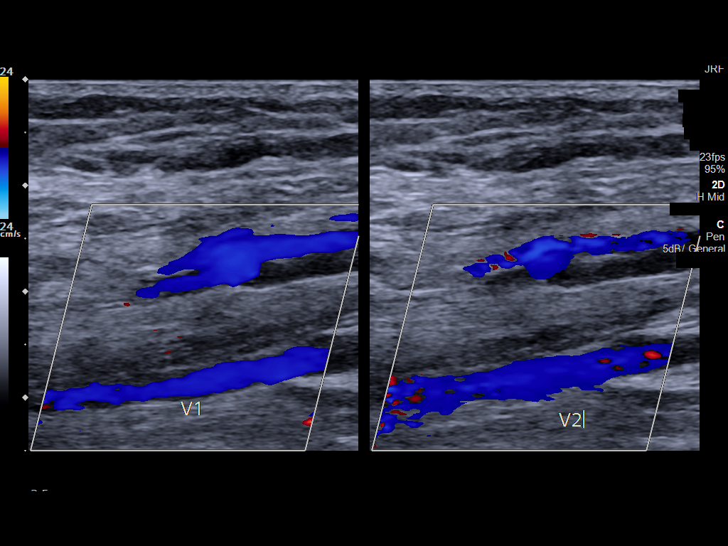

[13 of 24 positions shown; findings below may reference images not displayed]

FINDINGS: Contralateral Common Femoral Vein: Respiratory phasicity is normal
and symmetric with the symptomatic side. No evidence of thrombus.
Normal compressibility.

Common Femoral Vein: No evidence of thrombus. Normal
compressibility, respiratory phasicity and response to augmentation.

Saphenofemoral Junction: No evidence of thrombus. Normal
compressibility and flow on color Doppler imaging.

Profunda Femoral Vein: No evidence of thrombus. Normal
compressibility and flow on color Doppler imaging.

Femoral Vein: No evidence of thrombus. Normal compressibility,
respiratory phasicity and response to augmentation.

Popliteal Vein: No evidence of thrombus. Normal compressibility,
respiratory phasicity and response to augmentation.

Calf Veins: No evidence of thrombus. Normal compressibility and flow
on color Doppler imaging.

Superficial Great Saphenous Vein: No evidence of thrombus. Normal
compressibility.

Venous Reflux:  None.

Other Findings:  None.
IMPRESSION: No evidence of deep venous thrombosis.

## 2020-07-29 IMAGING — CT CT HEAD W/O CM
4 series · 16 of 47 positions shown, 18 images · non-contrast
Comparison: Head CT 11/19/2018

CLINICAL DATA: Altered mental status.  History of severe dementia.

EXAM:
CT HEAD WITHOUT CONTRAST
TECHNIQUE: Contiguous axial images were obtained from the base of the skull
through the vertex without intravenous contrast.

[Series 2: head wo · axial · 0.39mm/px · z∈[+1126,+1231]mm · 7 of 29 slices shown, 9 images]
[im 4/29  brain]
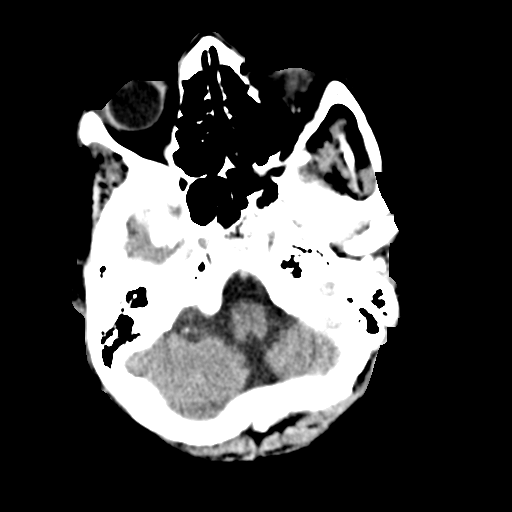
[im 4/29  bone]
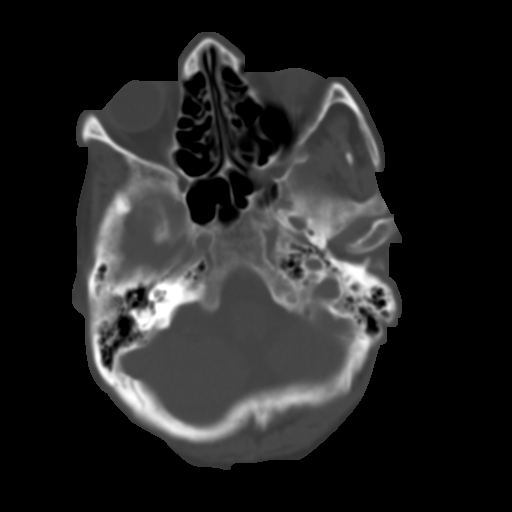
[im 8/29  brain]
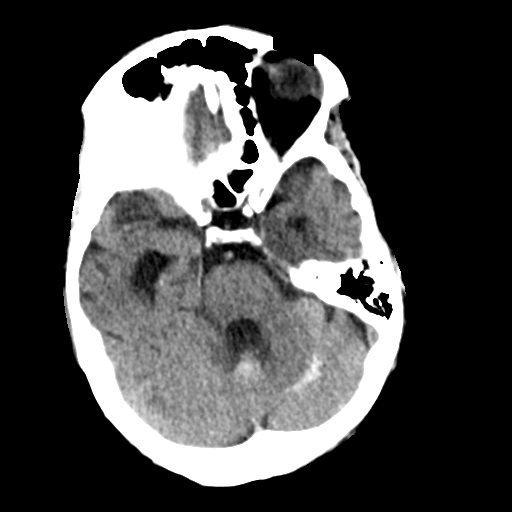
[im 11/29  brain]
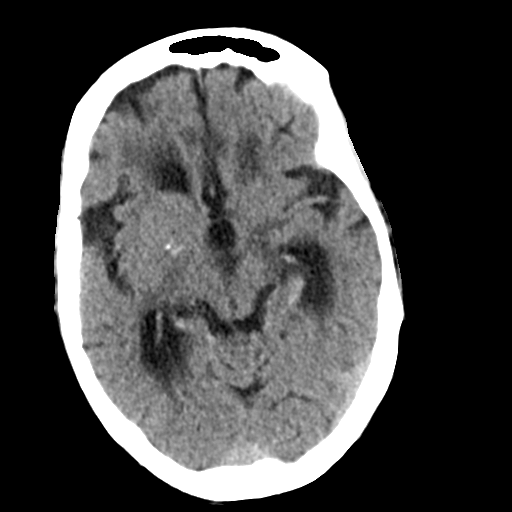
[im 15/29  brain]
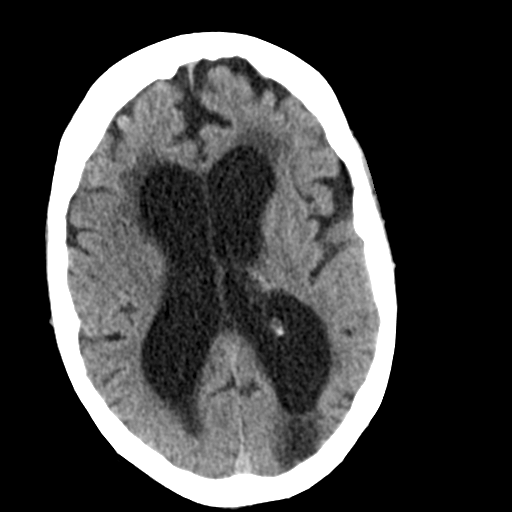
[im 18/29  brain]
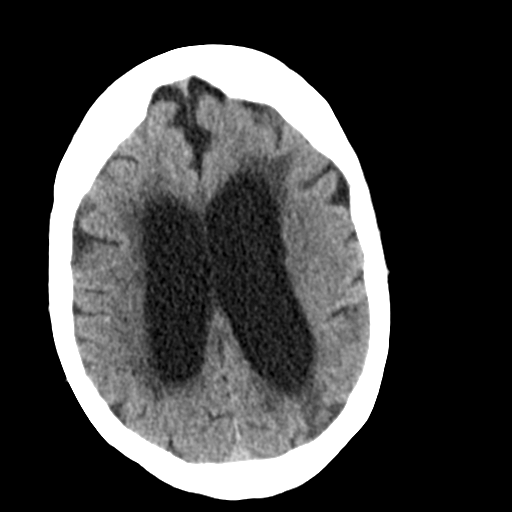
[im 18/29  bone]
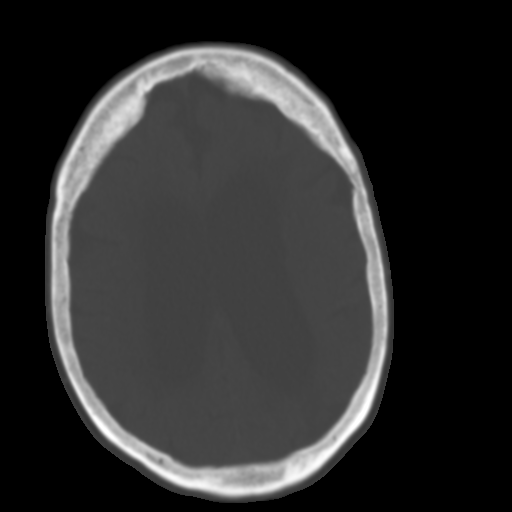
[im 22/29  brain]
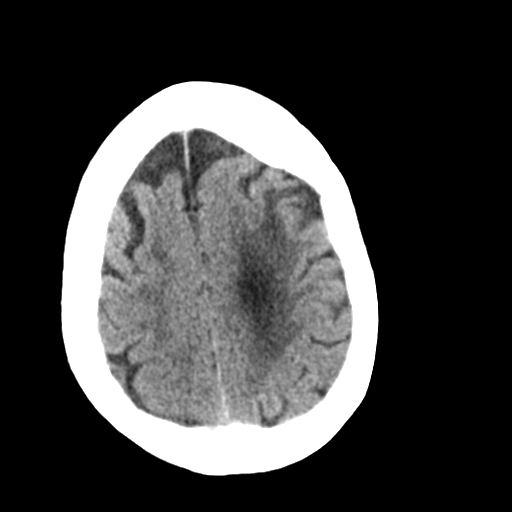
[im 25/29  brain]
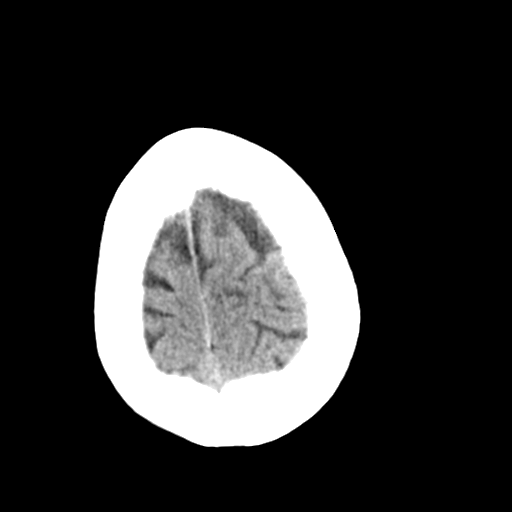

[Series 3: head bone · axial · 0.39mm/px · z∈[+1125,+1153]mm · 3 of 73 slices shown]
[im 8/73  bone]
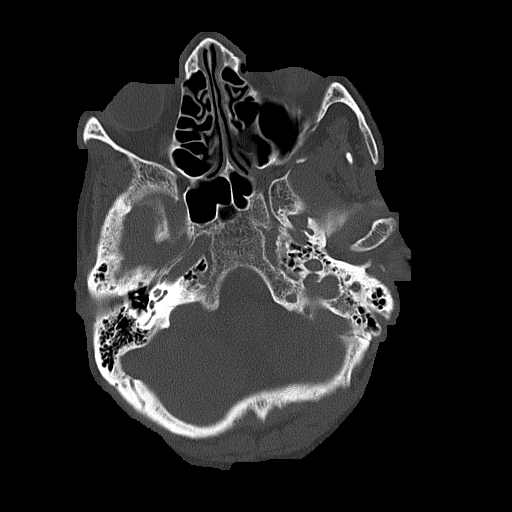
[im 15/73  bone]
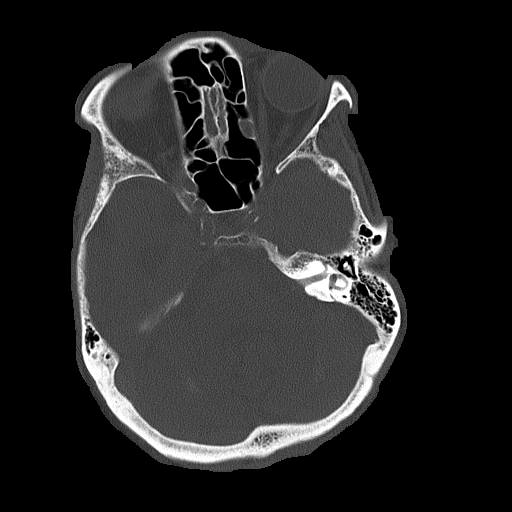
[im 22/73  bone]
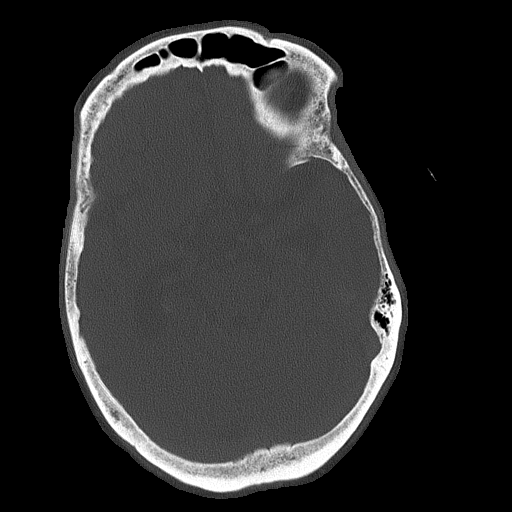

[Series 4: coronal soft tissue · coronal · 0.31mm/px · 3 of 67 slices shown]
[im 23/67  brain]
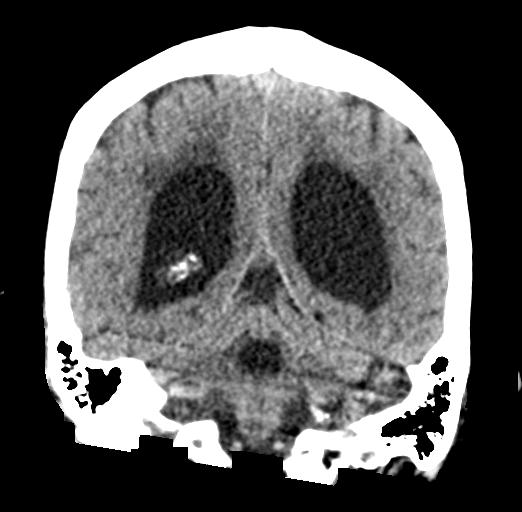
[im 30/67  brain]
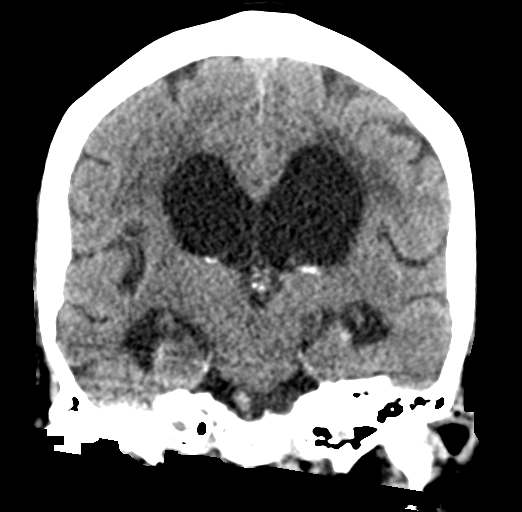
[im 37/67  brain]
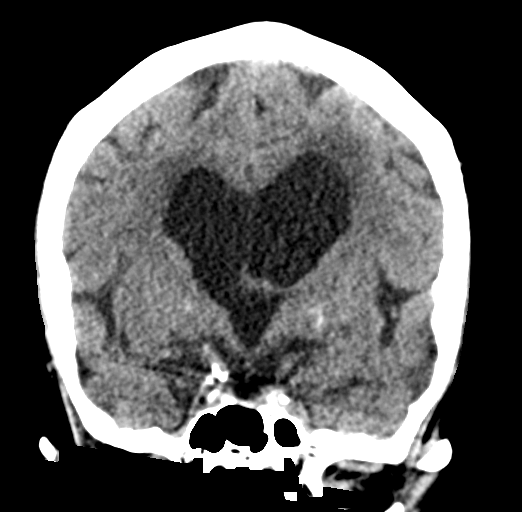

[Series 5: sagittal soft tissue · sagittal · 0.31mm/px · 3 of 53 slices shown]
[im 20/53  brain]
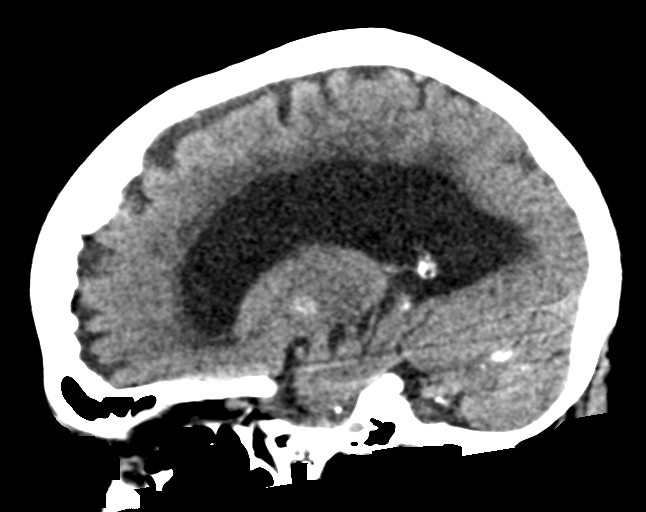
[im 27/53  brain]
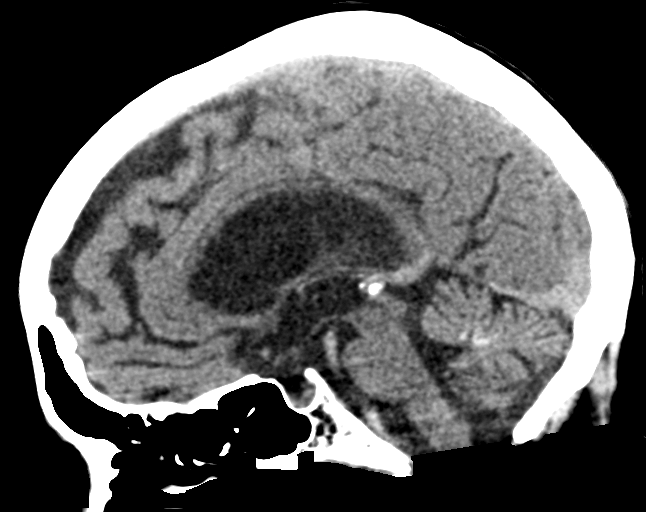
[im 33/53  brain]
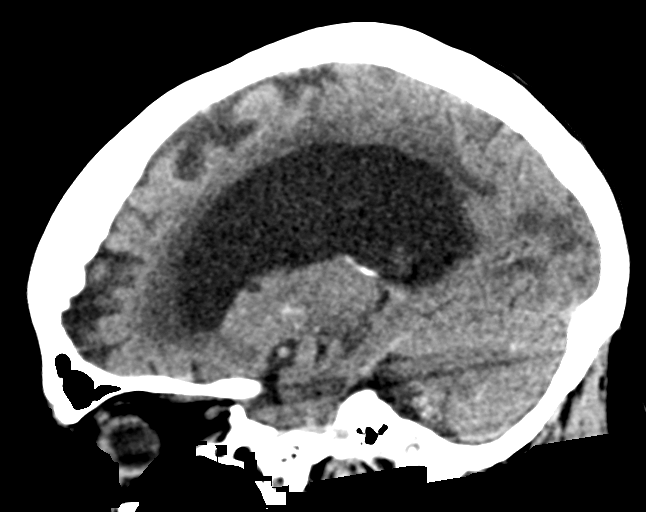

[16 of 47 positions shown; findings below may reference images not displayed]

FINDINGS: Brain: Stable age advanced cerebral atrophy, ventriculomegaly and
periventricular white matter disease. The ventricles appear dilated
out of proportion to the degree of cerebral atrophy and could not
exclude a component of communicating hydrocephalus.

Remote left occipital cortical infarct is noted.

No new/acute intracranial findings such as hemispheric infarction or
intracranial hemorrhage. No mass lesions. Stable bilateral basal
ganglia calcifications and bilateral dentate nuclei calcifications
in the cerebellum.

Vascular: Stable vascular calcifications. No hyperdense vessels or
definite aneurysm.

Skull: No skull fractures or bone lesions.

Sinuses/Orbits: The paranasal sinuses and mastoid air cells are
clear. The globes are intact

Other: No pelvic mass or adenopathy. No free pelvic fluid
collections. No inguinal mass or adenopathy. No abdominal wall
hernia or subcutaneous lesions.
IMPRESSION: 1. Stable appearing ventriculomegaly and periventricular white
matter disease. The ventricles appear dilated out of proportion to
the degree of cerebral atrophy and could not exclude a component of
communicating hydrocephalus.
2. No acute intracranial findings or mass lesion.

## 2021-07-22 ENCOUNTER — Telehealth: Payer: Self-pay | Admitting: Nurse Practitioner

## 2021-07-22 NOTE — Telephone Encounter (Signed)
Spoke with patient's husband Suann Larry", regarding the Palliative referral/services and after discussion and all questions were answered, husband was in agreement with scheduling visit.  I have scheduled an In-home Consult for 07/23/21 @ 9 AM. ?

## 2021-07-23 ENCOUNTER — Encounter: Payer: Self-pay | Admitting: Nurse Practitioner

## 2021-07-23 ENCOUNTER — Other Ambulatory Visit: Payer: Medicare HMO | Admitting: Nurse Practitioner

## 2021-07-23 ENCOUNTER — Other Ambulatory Visit: Payer: Self-pay

## 2021-07-23 DIAGNOSIS — R627 Adult failure to thrive: Secondary | ICD-10-CM

## 2021-07-23 DIAGNOSIS — F02818 Dementia in other diseases classified elsewhere, unspecified severity, with other behavioral disturbance: Secondary | ICD-10-CM | POA: Diagnosis not present

## 2021-07-23 DIAGNOSIS — R5381 Other malaise: Secondary | ICD-10-CM | POA: Diagnosis not present

## 2021-07-23 NOTE — Progress Notes (Signed)
[facility] Palliative Care Consult Note Telephone: [phone]  Fax: [phone]   Date of encounter: 07/23/21 10:10 PM PATIENT NAME: Deborah [name] [place] [id number]   [phone] (home)  DOB: 03-30-1946 MRN: [id number] PRIMARY CARE PROVIDER:    Alvester Chou, NP,  [address] [place] [address] [phone]  REFERRING PROVIDER:   Alvester Chou, NP [address] [place],  [place] [address] [phone]  RESPONSIBLE PARTY:    Contact Information     Name Relation Home Work Mobile   [place] Daughter [phone]   [place] Estelle Daughter [phone]  [phone]   [place] Spouse [phone]        I met face to face with patient and family in home. Palliative Care was asked to follow this patient by consultation request of  Alvester Chou, NP to address advance care planning and complex medical decision making. This is the initial visit.                        ASSESSMENT AND PLAN / RECOMMENDATIONS:  Symptom Management/Plan: 1. Advance Care Planning; DNR; previously under hospice services d/c due to stability 12/19/20  2. Goals of Care: Goals include to maximize quality of life and symptom management. Our advance care planning conversation included a discussion about:    The value and importance of advance care planning  Exploration of personal, cultural or spiritual beliefs that might influence medical decisions  Exploration of goals of care in the event of a sudden injury or illness  Identification and preparation of a healthcare agent  Review and updating or creation of an advance directive document.  3. FTT secondary to Dementia  4. Debility secondary to Dementia.   5. Palliative care encounter; Palliative care encounter; Palliative medicine team will continue to support patient, patient's family, and medical team. Visit consisted of counseling and education dealing with  the complex and emotionally intense issues of symptom management and palliative care in the setting of serious and potentially life-threatening illness  Follow up Palliative Care Visit: Palliative care will continue to follow for complex medical decision making, advance care planning, and clarification of goals. Return 4 weeks or prn.  I spent 46 minutes providing this consultation. More than 50% of the time in this consultation was spent in counseling and care coordination. PPS: 30%  HOSPICE ELIGIBILITY/DIAGNOSIS: TBD  Chief Complaint: Initial palliative consult for complex medical decision making  HISTORY OF PRESENT ILLNESS:  Deborah [name] is a 76 y.o. year old female  with multiple medical problems including dementia, FTT, arthritis, asthma, bipolar I, depression, osteoporosis, IBS, h/o heart murmur, h/o UTI's.   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  She was hospitalized 9/29-10/06/2018 after choking on a piece of chicken, diagnosed with aspiration pneumonia, with acute hypoxic respiratory failure.  She did not return to her baseline since the choking episode.  She was hospitalized again 10/8-10/02/2019 with acute R leg edema.  DVT was ruled out and no definitive diagnosis was made, but she subsequently developed R arm edema  with R dominant weakness, no longer able to use her R arm suspicious for a CVA as the etiology of the onset of edema and weakness.   She has contractures to right arm and leg.  She is getting close to being in fetal position but her family  continues to get her to chair/sofa, and they continue to take her out in the car for outings.  She needs 2 people for transfers, and 2 HHA required for personal care. She is resistant to care.  She declined functionally from PPS 60% April 2020 to PPS 40% September 2020, prior to hospitalization she had been able to stand/pivot with assistance, but she is now a total lift with 2 person assist, nonambulatory, PPS 30%.  She is no longer able to sit without leaning, FAST 7D since April 2021.  She is total care all ADLs, and is too weak to reposition herself, and she needs frequent repositioning.  Her core strength remains poor requiring her to be propped with additional pillows, and her family has to reposition/pull her back up frequently.  She rarely speaks more than a few words unless she is agitated, previously she was very verbal, and she has frequent mood swings.  Her family is hesitant to give medications for agitation.  She has improved appetite during last several months with 2-3 meals/day with snacks.  She rarely eats solid foods, but will eat soft foods, and continues to get nutrition from Ensure, 1 - 2/day consumed.  She has not been pocketing foods recently.  She lost weight from 130# baseline to 120# October 2020, to 97# January 2021, to 91# June 2021, to 89# August 2021, to 90# September 2021, to 88# November 2021, to 86# March 2022, BMI 15.7 kg/m2 at [facility]".   She is no longer able to tolerate being weighed.  She lost arm measurements from 22cm October 2020 to 21cm January 2021, to 20.5cm April 2021, to 19.5cm June 2021, to 19cm August - September 2021, to 18.5cm since November 2021- June 2022, to 21 cm June 2022, now up to 21.5cm, and lost thigh measurements from 35cm October 2021 to 33cm January 2021, to 32cm April 2021, to 31.5cm August 2021, to 31cm September 2021-January 2022, to 30.75cm March 2022, then up to 31cm May 2022, to now 33.2cm.  She has history of recurrent chronic stage II sacral wound  which is now closed, but redness persists requiring daily care.  She has not needed disimpaction of feces during this recertification period, most likely due to increased appetite.  She has not had any recent falls or infections, no recent wounds.  Discussed with PCP Alvester Chou, ANP who is in agreement with discharge and gave verbal order for Palliative Care referral on hospice discharge.   Patient no longer qualifies for hospice evidenced by clinical and functional stability with increased appetite and resulting increased body measurements, healed wounds, and no longer needing disimpaction of feces, which are all indicators of improvement and evidence of patient status more consistent with custodial care with prognosis likely greater than 6 months if the illness runs expected course.     The information stated above is based on the review of the patient's clinical record and report received from the face to face visit, hospice nurse, and team.    Jewel Baize, MD, [place]"   History obtained from review of EMR, discussion with Mr and Ms. Corbridge with daughter, Mikal Plane I reviewed available labs, medications, imaging, studies and related documents from the EMR.  Records reviewed and summarized above.   ROS 10 point system reviewed with Ms. Tow husband and daughter all negative except HPI  Physical Exam: Constitutional: NAD General: frail appearing, thin, cognitively impaired female EYES:  lids intact ENMT: oral mucous membranes moist CV: S1S2, RRR Pulmonary: LCTA, no increased work of breathing, no cough, room air Abdomen: normo-active BS + 4 quadrants, soft and non tender, MSK: functional quadriplegic; muscle wasting Skin: warm and dry Neuro:  + generalized weakness,  + cognitive impairment Psych: non-anxious affect, A and Oriented to self CURRENT PROBLEM LIST:  Patient Active Problem List   Diagnosis Date Noted   Failure to thrive in adult    Encounter for hospice care discussion     DNR (do not resuscitate) discussion    UTI (urinary tract infection) 02/22/2019   Acute respiratory failure ([place]) 02/13/2019   Goals of care, counseling/discussion    Palliative care by specialist    Acute delirium 10/03/2018   History of stroke 10/01/2018   Protein-calorie malnutrition, mild ([place]) 02/17/2018   Vitamin D deficiency 06/20/2017   Arthritis 06/17/2017   Hx of peptic ulcer 06/17/2017   IBS (irritable bowel syndrome) 06/17/2017   Unintended weight loss 06/17/2017   CKD (chronic kidney disease) stage 3, GFR 30-59 ml/min (HCC) 06/17/2017   Balance problem 11/09/2016   Late onset Alzheimer's disease with behavioral disturbance ([place]) 11/09/2016   Macrocytic anemia 08/03/2016   Hx of breast cancer 07/28/2016   Folate deficiency 10/02/2015   Bipolar 1 disorder, depressed ([place]) 09/25/2015   Smoker 09/25/2015   B12 deficiency 09/11/2015   Anxiety 07/23/2015   Severe episode of recurrent major depressive disorder ([place]) 07/03/2015   Dementia with behavioral disturbance 05/29/2015   History of migraine headaches 09/08/2014   H/O Bell's palsy 09/08/2014   H/O: HTN (hypertension) 09/08/2014   Gastroesophageal reflux disease without esophagitis 09/08/2014   COPD (chronic obstructive pulmonary disease) ([place]) 09/08/2014   History of asthma 09/08/2014   Breast neoplasm, Tis ([facility]) 01/23/2013   PAST MEDICAL HISTORY:  Active Ambulatory Problems    Diagnosis Date Noted   Breast neoplasm, Tis ([facility]) 01/23/2013   History of migraine headaches 09/08/2014   H/O Bell's palsy 09/08/2014   H/O: HTN (hypertension) 09/08/2014   Gastroesophageal reflux disease without esophagitis 09/08/2014   COPD (chronic obstructive pulmonary disease) ([place]) 09/08/2014   History of asthma 09/08/2014   Dementia with behavioral disturbance 05/29/2015   Anxiety 07/23/2015   Bipolar 1 disorder, depressed ([place]) 09/25/2015   Smoker 09/25/2015   Arthritis 06/17/2017   B12 deficiency 09/11/2015   Balance  problem 11/09/2016   Folate deficiency 10/02/2015   Hx of breast cancer 07/28/2016   Hx of peptic ulcer 06/17/2017   IBS (irritable bowel syndrome) 06/17/2017   Macrocytic anemia 08/03/2016   Severe episode of recurrent major depressive disorder ([place]) 07/03/2015   Unintended weight loss 06/17/2017   CKD (chronic kidney disease) stage 3, GFR 30-59 ml/min (HCC) 06/17/2017   Vitamin D deficiency 06/20/2017   Late onset Alzheimer's disease with behavioral disturbance ([place]) 11/09/2016   Protein-calorie malnutrition, mild ([place]) 02/17/2018   History of stroke 10/01/2018   Acute delirium 10/03/2018   Goals of care, counseling/discussion    Palliative care by specialist    Acute respiratory failure ([place]) 02/13/2019   UTI (urinary tract infection) 02/22/2019   Failure to thrive in adult    Encounter for hospice care discussion    DNR (do not resuscitate) discussion    Resolved Ambulatory Problems    Diagnosis  Date Noted   H/O disease 09/08/2014   Bipolar 1 disorder, depressed, moderate (HCC)    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, continuous abuse 04/01/2015   Depression, major, recurrent, moderate ([place]) 04/01/2015   Overdose 07/03/2015   Generalized anxiety disorder 07/23/2015   Bipolar 1 disorder, mixed, severe ([place]) 07/26/2016   Bacteriuria 07/31/2016   Swallowing problem 11/09/2016   Thrombocytopenia ([place]) 08/03/2016   Past Medical History:  Diagnosis Date   Asthma    Bipolar 1 disorder (HCC)    Cystitis    Dementia ([place])    Depression    Heart murmur    Osteoporosis    Spastic colon    Ulcer    SOCIAL HX:  Social History   Tobacco Use   Smoking status: Every Day    Packs/day: 1.00    Years: 20.00    Pack years: 20.00    Types: Cigarettes    Start date: 03/31/1977   Smokeless tobacco: Never  Substance Use Topics   Alcohol use: No    Alcohol/week: 0.0 standard drinks    Comment: Ocassional   FAMILY HX:  Family History  Problem  Relation Age of Onset   Heart attack Mother    Cancer Sister        ovarian   Cancer Maternal Aunt        breast   Cancer Maternal Aunt        breast      ALLERGIES:  Allergies  Allergen Reactions   Prednisone Nausea And Vomiting, Nausea Only and Other (See Comments)   Chlorphen-Pe-Acetaminophen Other (See Comments)    Reaction: unknown     PERTINENT MEDICATIONS:  Outpatient Encounter Medications as of 07/23/2021  Medication Sig   albuterol (VENTOLIN HFA) 108 (90 Base) MCG/ACT inhaler Inhale 2 puffs into the lungs every 4 (four) hours as needed for wheezing or shortness of breath.   donepezil (ARICEPT) 5 MG tablet Take 1 tablet (5 mg total) by mouth at bedtime.   ipratropium-albuterol (DUONEB) 0.5-2.5 (3) MG/3ML SOLN Take 3 mLs by nebulization every 6 (six) hours.   LORazepam (ATIVAN) 1 MG tablet Take 1 tablet (1 mg total) by mouth every 6 (six) hours as needed for anxiety.   Morphine Sulfate (MORPHINE CONCENTRATE) 10 mg / 0.5 ml concentrated solution Take 0.5 mLs (10 mg total) by mouth every 6 (six) hours as needed for severe pain or shortness of breath.   OLANZapine (ZYPREXA) 10 MG tablet Take 1.5 tablets (15 mg total) by mouth daily. 5 mg in AM and 10 mg in PM   No facility-administered encounter medications on file as of 07/23/2021.   Thank you for the opportunity to participate in the care of Ms. Elisabeth Cara.  The palliative care team will continue to follow. Please call our office at [phone] if we can be of additional assistance.   Cynthia Cogle Z Dago Jungwirth, NP ,   COVID-19 PATIENT SCREENING TOOL Asked and negative response unless otherwise noted:  Have you had symptoms of covid, tested positive or been in contact with someone with symptoms/positive test in the past 5-10 days? No

## 2021-07-29 ENCOUNTER — Telehealth: Payer: Self-pay | Admitting: Nurse Practitioner

## 2021-07-29 NOTE — Telephone Encounter (Signed)
I called Deborah [name], updated will see what the labs reveal and repeat measurements per [facility] physician request for further evaluation of eligibility. Deborah [name] in agreement ?

## 2021-08-31 ENCOUNTER — Telehealth: Payer: Self-pay

## 2021-08-31 NOTE — Telephone Encounter (Signed)
247 pm.  Spoke with Marletta Lor, NP who is requesting blood work to be completed on patient.  Supplies are already in the home.  NP attempted blood work earlier this month but was unsuccessful due to patient's agitation.  NP advises seroquel has been ordered to be given 1 hour prior to my arrival and may be administered again if patient is still agitated when I arrive.  Phone call made to Dan-spouse and appt scheduled for Wednesday at 11 am.  ?

## 2021-09-02 ENCOUNTER — Other Ambulatory Visit: Payer: Medicare HMO

## 2021-09-02 DIAGNOSIS — Z79899 Other long term (current) drug therapy: Secondary | ICD-10-CM | POA: Diagnosis not present

## 2021-09-02 DIAGNOSIS — Z515 Encounter for palliative care: Secondary | ICD-10-CM

## 2021-09-02 DIAGNOSIS — R64 Cachexia: Secondary | ICD-10-CM | POA: Diagnosis not present

## 2021-09-02 NOTE — Progress Notes (Signed)
PATIENT NAME: Deborah [name] ?DOB: May 18, 1945 ?MRN: [id number] ? ?PRIMARY CARE PROVIDER: Marletta Lor, NP ? ?RESPONSIBLE PARTY:  ?Acct ID - Guarantor Home Phone Work Phone Relationship Acct Type  ?[id number] Deborah [name], Deborah [name]* [phone]  Self P/F  ?   [address], [place], [place] [address]  ? ? ?Patient seen for blood work as requested by Marletta Lor, NP.  CMP, TSH and CBC with diff ordered.  Arrived at home and spouse advised that Seroquel was given an hour ago.  Patient is awake and calm.  Left AC accessed x 2.  Blood obtained without difficulty.  Blood taken to [facility] on [address] for processing.  Results to be faxed to Marletta Lor, NP and [facility].  ? ? ? ? ? ?Truitt Merle, RN ? ?

## 2021-09-08 ENCOUNTER — Emergency Department
Admission: EM | Admit: 2021-09-08 | Discharge: 2021-09-08 | Disposition: A | Discharge status: Home / Self Care | Payer: Medicare HMO | Attending: Emergency Medicine | Admitting: Emergency Medicine

## 2021-09-08 ENCOUNTER — Other Ambulatory Visit: Payer: Self-pay

## 2021-09-08 DIAGNOSIS — R55 Syncope and collapse: Secondary | ICD-10-CM | POA: Diagnosis not present

## 2021-09-08 DIAGNOSIS — F0390 Unspecified dementia without behavioral disturbance: Secondary | ICD-10-CM | POA: Insufficient documentation

## 2021-09-08 DIAGNOSIS — J45909 Unspecified asthma, uncomplicated: Secondary | ICD-10-CM | POA: Insufficient documentation

## 2021-09-08 DIAGNOSIS — R4020 Unspecified coma: Secondary | ICD-10-CM | POA: Diagnosis not present

## 2021-09-08 DIAGNOSIS — I10 Essential (primary) hypertension: Secondary | ICD-10-CM | POA: Diagnosis not present

## 2021-09-08 DIAGNOSIS — Z7189 Other specified counseling: Secondary | ICD-10-CM

## 2021-09-08 NOTE — ED Provider Notes (Signed)
? ?[facility] ?Provider Note ? ? ? Event Date/Time  ? First MD Initiated Contact with Patient 09/08/21 1939   ?  (approximate) ? ? ?History  ? ?Near [place] ? ? ?HPI ? ?Deborah [name] is a 76 y.o. female  with multiple medical problems including dementia, FTT, arthritis, asthma, bipolar I, depression, osteoporosis, IBS, h/o heart murmur, h/o UTI's having previously been on hospice but having left after surviving for 2 years currently residing at home with daughter who takes complete care of her who presents after patient apparently had an episode where she was briefly unresponsive.  Patient was eating some chicken nuggets and she seemed to slump forward and was unresponsive for a few minutes.  Daughter called a neighbor who was the EMS professional and by the time neighbor arrived patient seemed to be alert in her usual state health.  She is verbal at baseline but not oriented and unable to write any history at baseline.  She otherwise been in usual state of the last couple days without any other recent falls or injuries, fevers, cough, vomiting diarrhea or any other acute concerns per daughter.  At baseline she is contracted in her right upper extremity and right lower extremity.  ? ?  ? ? ?Physical Exam  ?Triage Vital Signs: ?ED Triage Vitals  ?Enc Vitals Group  ?   BP   ?   Pulse   ?   Resp   ?   Temp   ?   Temp src   ?   SpO2   ?   Weight   ?   Height   ?   Head Circumference   ?   Peak Flow   ?   Pain Score   ?   Pain Loc   ?   Pain Edu?   ?   Excl. in GC?   ? ? ?Most recent vital signs: ?Vitals:  ? 09/08/21 2000 09/08/21 2030  ?BP: (!) 145/77 (!) 160/82  ?Pulse: 79 80  ?Resp: 14 16  ?Temp:    ?SpO2: 98% 99%  ? ? ?General: Awake, no distress.  ?CV:  Good peripheral perfusion.  2+ radial pulses ?Resp:  Normal effort.  Bilaterally. ?Abd:  No distention.  Soft. ?Other:  Will state name only.  Does not reliably follow commands.  PERRLA.  EOMI.  Right upper extremity and right lower extremity  are contracted.  Patient is moving left upper and lower extremities ? ? ?ED Results / Procedures / Treatments  ?Labs ?(all labs ordered are listed, but only abnormal results are displayed) ?Labs Reviewed - No data to display ? ? ?EKG ? ?ECG is remarkable for significant artifact and is interpreted by machine as A-fib although does appear sinus and regular to this examiner without evidence of acute ischemia or significant arrhythmia. ? ? ?RADIOLOGY ? ? ? ?PROCEDURES: ? ?Critical Care performed: No ? ?.1-3 Lead EKG Interpretation ?Performed by: Gilles Chiquito, MD ?Authorized by: Gilles Chiquito, MD  ? ?  Interpretation: non-specific   ?  ECG rate assessment: normal   ?  Rhythm: sinus rhythm   ?  Ectopy: none   ?  Conduction: normal   ? ?The patient is on the cardiac monitor to evaluate for evidence of arrhythmia and/or significant heart rate changes. ? ? ?MEDICATIONS ORDERED IN ED: ?Medications - No data to display ? ? ?IMPRESSION / MDM / ASSESSMENT AND PLAN / ED COURSE  ?I reviewed the triage vital signs  and the nursing notes. ?             ?               ? ?Differential diagnosis includes, but is not limited to choking episode, CVA, arrhythmia, ACS, anemia, metabolic derangements, acute endocrine derangement, cardiomyopathy, carotid stenosis, acute infectious process and others. ? ?I had extensive discussion with daughter at bedside who is patient's POA.  She verified that patient seems to be in her usual state of health at present and does not seem to be in any distress.  She confirms that they are currently working with palliative consultant and that patient is DNR/DNI.  Had a lengthy discussion regarding possible work-up in the ED for above-noted etiologies which could be life-threatening although after much discussion daughter states clearly that at this point she would prefer to pursue comfort measures only does not wish to pursue any additional diagnostic studies or interventions focused on life  prolongation and only wishes to focus on keeping her mother comfortable.  She states she feels comfortable taking her mother home.  All questions answered to the best my ability.  Discharged home to doctor's care. ? ?  ? ? ?FINAL CLINICAL IMPRESSION(S) / ED DIAGNOSES  ? ?Final diagnoses:  ?Syncope, unspecified syncope type  ?Goals of care, counseling/discussion  ?Hypertension, unspecified type  ?Dementia, unspecified dementia severity, unspecified dementia type, unspecified whether behavioral, psychotic, or mood disturbance or anxiety (HCC)  ? ? ? ?Rx / DC Orders  ? ?ED Discharge Orders   ? ? None  ? ?  ? ? ? ?Note:  This document was prepared using Dragon voice recognition software and may include unintentional dictation errors. ?  ?Gilles Chiquito, MD ?09/08/21 2118 ? ?

## 2021-09-08 NOTE — ED Triage Notes (Signed)
[facility] pt from home pt was unresponsve according to family and was pale, pt was found by ems alert and awake, pt was eating dinner while this happened, hx dementia; 129/78 99% RA, cbg 169.  ?

## 2021-12-29 ENCOUNTER — Telehealth: Payer: Medicare HMO | Admitting: Nurse Practitioner

## 2022-02-26 DIAGNOSIS — R32 Unspecified urinary incontinence: Secondary | ICD-10-CM | POA: Diagnosis not present

## 2022-02-26 DIAGNOSIS — N3946 Mixed incontinence: Secondary | ICD-10-CM | POA: Diagnosis not present

## 2022-02-26 DIAGNOSIS — I69318 Other symptoms and signs involving cognitive functions following cerebral infarction: Secondary | ICD-10-CM | POA: Diagnosis not present

## 2022-05-07 DIAGNOSIS — F0154 Vascular dementia, unspecified severity, with anxiety: Secondary | ICD-10-CM | POA: Diagnosis not present

## 2022-05-07 DIAGNOSIS — I69351 Hemiplegia and hemiparesis following cerebral infarction affecting right dominant side: Secondary | ICD-10-CM | POA: Diagnosis not present

## 2022-05-07 DIAGNOSIS — J449 Chronic obstructive pulmonary disease, unspecified: Secondary | ICD-10-CM | POA: Diagnosis not present

## 2022-05-07 DIAGNOSIS — F411 Generalized anxiety disorder: Secondary | ICD-10-CM | POA: Diagnosis not present

## 2022-05-07 DIAGNOSIS — R627 Adult failure to thrive: Secondary | ICD-10-CM | POA: Diagnosis not present

## 2022-05-12 DIAGNOSIS — R5381 Other malaise: Secondary | ICD-10-CM | POA: Diagnosis not present

## 2022-07-06 DIAGNOSIS — I69354 Hemiplegia and hemiparesis following cerebral infarction affecting left non-dominant side: Secondary | ICD-10-CM | POA: Diagnosis not present

## 2022-07-06 DIAGNOSIS — R5381 Other malaise: Secondary | ICD-10-CM | POA: Diagnosis not present

## 2022-07-14 DIAGNOSIS — F02C2 Dementia in other diseases classified elsewhere, severe, with psychotic disturbance: Secondary | ICD-10-CM | POA: Diagnosis not present

## 2022-07-14 DIAGNOSIS — F3132 Bipolar disorder, current episode depressed, moderate: Secondary | ICD-10-CM | POA: Diagnosis not present

## 2022-08-16 DIAGNOSIS — F3132 Bipolar disorder, current episode depressed, moderate: Secondary | ICD-10-CM | POA: Diagnosis not present

## 2022-08-16 DIAGNOSIS — F02C2 Dementia in other diseases classified elsewhere, severe, with psychotic disturbance: Secondary | ICD-10-CM | POA: Diagnosis not present

## 2023-02-15 DEATH — deceased
